# Patient Record
Sex: Female | Born: 1945
Health system: Southern US, Community
[De-identification: ages and names within clinical notes are randomized; demographics above are authoritative.]

## PROBLEM LIST (undated history)

## (undated) DIAGNOSIS — G4730 Sleep apnea, unspecified: Secondary | ICD-10-CM

## (undated) DIAGNOSIS — I10 Essential (primary) hypertension: Secondary | ICD-10-CM

## (undated) DIAGNOSIS — R011 Cardiac murmur, unspecified: Secondary | ICD-10-CM

## (undated) DIAGNOSIS — H9319 Tinnitus, unspecified ear: Secondary | ICD-10-CM

## (undated) DIAGNOSIS — M1990 Unspecified osteoarthritis, unspecified site: Secondary | ICD-10-CM

## (undated) DIAGNOSIS — A6000 Herpesviral infection of urogenital system, unspecified: Secondary | ICD-10-CM

## (undated) DIAGNOSIS — E785 Hyperlipidemia, unspecified: Secondary | ICD-10-CM

## (undated) DIAGNOSIS — N393 Stress incontinence (female) (male): Secondary | ICD-10-CM

## (undated) DIAGNOSIS — N3281 Overactive bladder: Secondary | ICD-10-CM

## (undated) DIAGNOSIS — I341 Nonrheumatic mitral (valve) prolapse: Secondary | ICD-10-CM

## (undated) HISTORY — PX: KNEE ARTHROSCOPY: SUR90

## (undated) HISTORY — DX: Hyperlipidemia, unspecified: E78.5

## (undated) HISTORY — PX: HAND SURGERY: SHX662

## (undated) HISTORY — DX: Essential (primary) hypertension: I10

## (undated) HISTORY — DX: Unspecified osteoarthritis, unspecified site: M19.90

## (undated) HISTORY — PX: TONSILLECTOMY: SUR1361

## (undated) HISTORY — PX: SPINE SURGERY: SHX786

## (undated) HISTORY — PX: TUBAL LIGATION: SHX77

## (undated) HISTORY — DX: Herpesviral infection of urogenital system, unspecified: A60.00

## (undated) HISTORY — PX: OOPHORECTOMY: SHX86

## (undated) HISTORY — DX: Nonrheumatic mitral (valve) prolapse: I34.1

## (undated) HISTORY — PX: JOINT REPLACEMENT: SHX530

## (undated) HISTORY — PX: BREAST SURGERY: SHX581

## (undated) HISTORY — PX: BACK SURGERY: SHX140

## (undated) HISTORY — DX: Cardiac murmur, unspecified: R01.1

---

## 1996-01-27 HISTORY — PX: BREAST EXCISIONAL BIOPSY: SUR124

## 1998-01-03 ENCOUNTER — Ambulatory Visit (HOSPITAL_COMMUNITY): Admission: RE | Admit: 1998-01-03 | Discharge: 1998-01-03 | Payer: Self-pay | Admitting: Obstetrics and Gynecology

## 1998-12-26 ENCOUNTER — Other Ambulatory Visit: Admission: RE | Admit: 1998-12-26 | Discharge: 1998-12-26 | Payer: Self-pay | Admitting: Gynecology

## 1999-01-05 ENCOUNTER — Ambulatory Visit (HOSPITAL_COMMUNITY): Admission: RE | Admit: 1999-01-05 | Discharge: 1999-01-05 | Payer: Self-pay | Admitting: Gynecology

## 1999-01-05 ENCOUNTER — Encounter: Payer: Self-pay | Admitting: Gynecology

## 1999-02-21 ENCOUNTER — Encounter (INDEPENDENT_AMBULATORY_CARE_PROVIDER_SITE_OTHER): Payer: Self-pay | Admitting: Specialist

## 1999-02-21 ENCOUNTER — Ambulatory Visit (HOSPITAL_BASED_OUTPATIENT_CLINIC_OR_DEPARTMENT_OTHER): Admission: RE | Admit: 1999-02-21 | Discharge: 1999-02-21 | Payer: Self-pay | Admitting: Orthopedic Surgery

## 1999-03-20 ENCOUNTER — Other Ambulatory Visit: Admission: RE | Admit: 1999-03-20 | Discharge: 1999-03-20 | Payer: Self-pay | Admitting: Gynecology

## 1999-03-20 ENCOUNTER — Encounter (INDEPENDENT_AMBULATORY_CARE_PROVIDER_SITE_OTHER): Payer: Self-pay

## 2000-01-18 ENCOUNTER — Other Ambulatory Visit: Admission: RE | Admit: 2000-01-18 | Discharge: 2000-01-18 | Payer: Self-pay | Admitting: Gynecology

## 2000-01-23 ENCOUNTER — Encounter: Payer: Self-pay | Admitting: Gynecology

## 2000-01-23 ENCOUNTER — Ambulatory Visit (HOSPITAL_COMMUNITY): Admission: RE | Admit: 2000-01-23 | Discharge: 2000-01-23 | Payer: Self-pay | Admitting: Gynecology

## 2000-06-12 ENCOUNTER — Encounter (INDEPENDENT_AMBULATORY_CARE_PROVIDER_SITE_OTHER): Payer: Self-pay

## 2000-06-12 ENCOUNTER — Ambulatory Visit (HOSPITAL_COMMUNITY): Admission: RE | Admit: 2000-06-12 | Discharge: 2000-06-12 | Payer: Self-pay | Admitting: Gynecology

## 2001-01-20 ENCOUNTER — Other Ambulatory Visit: Admission: RE | Admit: 2001-01-20 | Discharge: 2001-01-20 | Payer: Self-pay | Admitting: Gynecology

## 2001-01-27 ENCOUNTER — Encounter: Payer: Self-pay | Admitting: Gynecology

## 2001-01-27 ENCOUNTER — Ambulatory Visit (HOSPITAL_COMMUNITY): Admission: RE | Admit: 2001-01-27 | Discharge: 2001-01-27 | Payer: Self-pay | Admitting: Gynecology

## 2001-03-06 ENCOUNTER — Encounter: Admission: RE | Admit: 2001-03-06 | Discharge: 2001-03-06 | Payer: Self-pay | Admitting: Internal Medicine

## 2001-03-06 ENCOUNTER — Encounter: Payer: Self-pay | Admitting: Internal Medicine

## 2001-10-19 ENCOUNTER — Encounter: Payer: Self-pay | Admitting: Internal Medicine

## 2001-10-19 ENCOUNTER — Encounter: Admission: RE | Admit: 2001-10-19 | Discharge: 2001-10-19 | Payer: Self-pay | Admitting: Internal Medicine

## 2001-11-09 ENCOUNTER — Emergency Department (HOSPITAL_COMMUNITY): Admission: EM | Admit: 2001-11-09 | Discharge: 2001-11-09 | Payer: Self-pay | Admitting: Emergency Medicine

## 2001-11-27 ENCOUNTER — Encounter: Admission: RE | Admit: 2001-11-27 | Discharge: 2001-11-27 | Payer: Self-pay | Admitting: Internal Medicine

## 2001-11-27 ENCOUNTER — Encounter: Payer: Self-pay | Admitting: Internal Medicine

## 2001-12-01 ENCOUNTER — Ambulatory Visit (HOSPITAL_COMMUNITY): Admission: RE | Admit: 2001-12-01 | Discharge: 2001-12-01 | Payer: Self-pay | Admitting: Internal Medicine

## 2001-12-01 ENCOUNTER — Encounter: Payer: Self-pay | Admitting: Internal Medicine

## 2002-03-02 ENCOUNTER — Encounter: Payer: Self-pay | Admitting: Gynecology

## 2002-03-02 ENCOUNTER — Ambulatory Visit (HOSPITAL_COMMUNITY): Admission: RE | Admit: 2002-03-02 | Discharge: 2002-03-02 | Payer: Self-pay | Admitting: Gynecology

## 2002-03-25 ENCOUNTER — Other Ambulatory Visit: Admission: RE | Admit: 2002-03-25 | Discharge: 2002-03-25 | Payer: Self-pay | Admitting: Gynecology

## 2002-03-30 ENCOUNTER — Encounter: Payer: Self-pay | Admitting: Internal Medicine

## 2002-03-30 ENCOUNTER — Encounter: Admission: RE | Admit: 2002-03-30 | Discharge: 2002-03-30 | Payer: Self-pay | Admitting: Internal Medicine

## 2002-04-30 ENCOUNTER — Encounter: Admission: RE | Admit: 2002-04-30 | Discharge: 2002-04-30 | Payer: Self-pay | Admitting: Internal Medicine

## 2002-04-30 ENCOUNTER — Encounter: Payer: Self-pay | Admitting: Internal Medicine

## 2002-05-12 ENCOUNTER — Encounter: Admission: RE | Admit: 2002-05-12 | Discharge: 2002-05-12 | Payer: Self-pay | Admitting: Gynecology

## 2002-05-12 ENCOUNTER — Encounter: Payer: Self-pay | Admitting: Gynecology

## 2002-09-13 ENCOUNTER — Encounter: Payer: Self-pay | Admitting: Neurosurgery

## 2002-09-13 ENCOUNTER — Encounter: Admission: RE | Admit: 2002-09-13 | Discharge: 2002-09-13 | Payer: Self-pay | Admitting: Neurosurgery

## 2002-09-29 ENCOUNTER — Encounter: Admission: RE | Admit: 2002-09-29 | Discharge: 2002-09-29 | Payer: Self-pay | Admitting: Neurosurgery

## 2002-09-29 ENCOUNTER — Encounter: Payer: Self-pay | Admitting: Neurosurgery

## 2002-11-02 ENCOUNTER — Encounter: Payer: Self-pay | Admitting: Neurosurgery

## 2002-11-04 ENCOUNTER — Ambulatory Visit (HOSPITAL_COMMUNITY): Admission: RE | Admit: 2002-11-04 | Discharge: 2002-11-05 | Payer: Self-pay | Admitting: Neurosurgery

## 2002-11-04 ENCOUNTER — Encounter: Payer: Self-pay | Admitting: Neurosurgery

## 2003-03-29 ENCOUNTER — Ambulatory Visit (HOSPITAL_COMMUNITY): Admission: RE | Admit: 2003-03-29 | Discharge: 2003-03-29 | Payer: Self-pay | Admitting: Gynecology

## 2003-04-05 ENCOUNTER — Other Ambulatory Visit: Admission: RE | Admit: 2003-04-05 | Discharge: 2003-04-05 | Payer: Self-pay | Admitting: Gynecology

## 2003-06-05 ENCOUNTER — Observation Stay (HOSPITAL_COMMUNITY): Admission: EM | Admit: 2003-06-05 | Discharge: 2003-06-06 | Payer: Self-pay | Admitting: Emergency Medicine

## 2003-08-07 ENCOUNTER — Encounter: Admission: RE | Admit: 2003-08-07 | Discharge: 2003-08-07 | Payer: Self-pay | Admitting: Internal Medicine

## 2003-09-06 ENCOUNTER — Encounter: Admission: RE | Admit: 2003-09-06 | Discharge: 2003-09-06 | Payer: Self-pay | Admitting: Neurosurgery

## 2003-09-22 ENCOUNTER — Encounter: Admission: RE | Admit: 2003-09-22 | Discharge: 2003-09-22 | Payer: Self-pay | Admitting: Neurosurgery

## 2003-10-05 ENCOUNTER — Encounter: Admission: RE | Admit: 2003-10-05 | Discharge: 2003-10-05 | Payer: Self-pay | Admitting: Neurosurgery

## 2003-10-22 ENCOUNTER — Ambulatory Visit (HOSPITAL_COMMUNITY): Admission: RE | Admit: 2003-10-22 | Discharge: 2003-10-22 | Payer: Self-pay | Admitting: Neurology

## 2004-01-19 ENCOUNTER — Ambulatory Visit (HOSPITAL_COMMUNITY): Admission: RE | Admit: 2004-01-19 | Discharge: 2004-01-19 | Payer: Self-pay | Admitting: Gastroenterology

## 2004-01-19 ENCOUNTER — Encounter (INDEPENDENT_AMBULATORY_CARE_PROVIDER_SITE_OTHER): Payer: Self-pay | Admitting: *Deleted

## 2004-02-15 ENCOUNTER — Ambulatory Visit (HOSPITAL_COMMUNITY): Admission: RE | Admit: 2004-02-15 | Discharge: 2004-02-15 | Payer: Self-pay | Admitting: Specialist

## 2004-02-15 ENCOUNTER — Ambulatory Visit (HOSPITAL_BASED_OUTPATIENT_CLINIC_OR_DEPARTMENT_OTHER): Admission: RE | Admit: 2004-02-15 | Discharge: 2004-02-15 | Payer: Self-pay | Admitting: Specialist

## 2004-03-29 ENCOUNTER — Ambulatory Visit (HOSPITAL_COMMUNITY): Admission: RE | Admit: 2004-03-29 | Discharge: 2004-03-29 | Payer: Self-pay | Admitting: Gynecology

## 2004-04-02 ENCOUNTER — Encounter: Admission: RE | Admit: 2004-04-02 | Discharge: 2004-04-02 | Payer: Self-pay | Admitting: Internal Medicine

## 2004-04-06 ENCOUNTER — Other Ambulatory Visit: Admission: RE | Admit: 2004-04-06 | Discharge: 2004-04-06 | Payer: Self-pay | Admitting: Gynecology

## 2004-05-21 ENCOUNTER — Ambulatory Visit (HOSPITAL_COMMUNITY): Admission: RE | Admit: 2004-05-21 | Discharge: 2004-05-21 | Payer: Self-pay | Admitting: Neurology

## 2005-04-01 ENCOUNTER — Ambulatory Visit (HOSPITAL_COMMUNITY): Admission: RE | Admit: 2005-04-01 | Discharge: 2005-04-01 | Payer: Self-pay | Admitting: Gynecology

## 2005-05-07 ENCOUNTER — Other Ambulatory Visit: Admission: RE | Admit: 2005-05-07 | Discharge: 2005-05-07 | Payer: Self-pay | Admitting: Gynecology

## 2005-12-17 ENCOUNTER — Ambulatory Visit (HOSPITAL_BASED_OUTPATIENT_CLINIC_OR_DEPARTMENT_OTHER): Admission: RE | Admit: 2005-12-17 | Discharge: 2005-12-17 | Payer: Self-pay | Admitting: Orthopedic Surgery

## 2006-04-30 ENCOUNTER — Ambulatory Visit (HOSPITAL_COMMUNITY): Admission: RE | Admit: 2006-04-30 | Discharge: 2006-04-30 | Payer: Self-pay | Admitting: Gynecology

## 2006-05-13 ENCOUNTER — Other Ambulatory Visit: Admission: RE | Admit: 2006-05-13 | Discharge: 2006-05-13 | Payer: Self-pay | Admitting: Gynecology

## 2006-12-01 ENCOUNTER — Other Ambulatory Visit: Admission: RE | Admit: 2006-12-01 | Discharge: 2006-12-01 | Payer: Self-pay | Admitting: Gynecology

## 2006-12-20 ENCOUNTER — Emergency Department (HOSPITAL_COMMUNITY): Admission: EM | Admit: 2006-12-20 | Discharge: 2006-12-20 | Payer: Self-pay | Admitting: Emergency Medicine

## 2007-01-23 ENCOUNTER — Encounter: Admission: RE | Admit: 2007-01-23 | Discharge: 2007-01-23 | Payer: Self-pay | Admitting: Internal Medicine

## 2007-04-28 ENCOUNTER — Encounter: Admission: RE | Admit: 2007-04-28 | Discharge: 2007-04-28 | Payer: Self-pay | Admitting: Internal Medicine

## 2007-05-06 ENCOUNTER — Ambulatory Visit (HOSPITAL_COMMUNITY): Admission: RE | Admit: 2007-05-06 | Discharge: 2007-05-06 | Payer: Self-pay | Admitting: Gynecology

## 2007-05-13 ENCOUNTER — Other Ambulatory Visit: Admission: RE | Admit: 2007-05-13 | Discharge: 2007-05-13 | Payer: Self-pay | Admitting: Gynecology

## 2007-10-01 ENCOUNTER — Ambulatory Visit: Payer: Self-pay | Admitting: Pulmonary Disease

## 2007-10-01 DIAGNOSIS — G4733 Obstructive sleep apnea (adult) (pediatric): Secondary | ICD-10-CM | POA: Insufficient documentation

## 2007-10-01 DIAGNOSIS — E785 Hyperlipidemia, unspecified: Secondary | ICD-10-CM | POA: Insufficient documentation

## 2007-10-28 ENCOUNTER — Ambulatory Visit (HOSPITAL_BASED_OUTPATIENT_CLINIC_OR_DEPARTMENT_OTHER): Admission: RE | Admit: 2007-10-28 | Discharge: 2007-10-28 | Payer: Self-pay | Admitting: Pulmonary Disease

## 2007-10-28 ENCOUNTER — Encounter: Payer: Self-pay | Admitting: Pulmonary Disease

## 2007-11-13 ENCOUNTER — Ambulatory Visit: Payer: Self-pay | Admitting: Internal Medicine

## 2007-11-18 ENCOUNTER — Telehealth (INDEPENDENT_AMBULATORY_CARE_PROVIDER_SITE_OTHER): Payer: Self-pay | Admitting: *Deleted

## 2007-11-19 ENCOUNTER — Ambulatory Visit: Payer: Self-pay | Admitting: Pulmonary Disease

## 2007-12-21 ENCOUNTER — Ambulatory Visit: Payer: Self-pay | Admitting: Internal Medicine

## 2008-01-15 DIAGNOSIS — H9319 Tinnitus, unspecified ear: Secondary | ICD-10-CM

## 2008-01-15 HISTORY — DX: Tinnitus, unspecified ear: H93.19

## 2008-03-28 ENCOUNTER — Ambulatory Visit: Payer: Self-pay | Admitting: Internal Medicine

## 2008-04-18 ENCOUNTER — Ambulatory Visit: Payer: Self-pay | Admitting: Internal Medicine

## 2008-05-10 ENCOUNTER — Ambulatory Visit (HOSPITAL_COMMUNITY): Admission: RE | Admit: 2008-05-10 | Discharge: 2008-05-10 | Payer: Self-pay | Admitting: Gynecology

## 2008-05-13 ENCOUNTER — Encounter: Admission: RE | Admit: 2008-05-13 | Discharge: 2008-05-13 | Payer: Self-pay | Admitting: Gynecology

## 2008-05-16 ENCOUNTER — Encounter: Payer: Self-pay | Admitting: Gynecology

## 2008-05-16 ENCOUNTER — Ambulatory Visit: Payer: Self-pay | Admitting: Gynecology

## 2008-05-16 ENCOUNTER — Other Ambulatory Visit: Admission: RE | Admit: 2008-05-16 | Discharge: 2008-05-16 | Payer: Self-pay | Admitting: Gynecology

## 2008-06-14 ENCOUNTER — Ambulatory Visit: Payer: Self-pay | Admitting: Gynecology

## 2008-09-23 ENCOUNTER — Ambulatory Visit: Payer: Self-pay | Admitting: Internal Medicine

## 2008-09-30 ENCOUNTER — Ambulatory Visit (HOSPITAL_BASED_OUTPATIENT_CLINIC_OR_DEPARTMENT_OTHER): Admission: RE | Admit: 2008-09-30 | Discharge: 2008-09-30 | Payer: Self-pay | Admitting: Specialist

## 2008-10-07 ENCOUNTER — Ambulatory Visit: Payer: Self-pay | Admitting: Internal Medicine

## 2008-10-17 ENCOUNTER — Ambulatory Visit: Payer: Self-pay | Admitting: Internal Medicine

## 2009-02-10 ENCOUNTER — Ambulatory Visit: Payer: Self-pay | Admitting: Gynecology

## 2009-03-07 ENCOUNTER — Ambulatory Visit: Payer: Self-pay | Admitting: Gynecology

## 2009-05-01 ENCOUNTER — Ambulatory Visit: Payer: Self-pay | Admitting: Internal Medicine

## 2009-05-24 ENCOUNTER — Ambulatory Visit (HOSPITAL_COMMUNITY): Admission: RE | Admit: 2009-05-24 | Discharge: 2009-05-24 | Payer: Self-pay | Admitting: Gynecology

## 2009-05-30 ENCOUNTER — Ambulatory Visit: Payer: Self-pay | Admitting: Gynecology

## 2009-05-30 ENCOUNTER — Other Ambulatory Visit: Admission: RE | Admit: 2009-05-30 | Discharge: 2009-05-30 | Payer: Self-pay | Admitting: Gynecology

## 2009-09-07 ENCOUNTER — Ambulatory Visit: Payer: Self-pay | Admitting: Gynecology

## 2009-11-06 ENCOUNTER — Ambulatory Visit: Payer: Self-pay | Admitting: Internal Medicine

## 2010-01-09 ENCOUNTER — Ambulatory Visit
Admission: RE | Admit: 2010-01-09 | Discharge: 2010-01-09 | Payer: Self-pay | Source: Home / Self Care | Attending: Internal Medicine | Admitting: Internal Medicine

## 2010-01-09 ENCOUNTER — Ambulatory Visit (HOSPITAL_COMMUNITY)
Admission: RE | Admit: 2010-01-09 | Discharge: 2010-01-09 | Payer: Self-pay | Source: Home / Self Care | Attending: Internal Medicine | Admitting: Internal Medicine

## 2010-01-09 ENCOUNTER — Encounter: Payer: Self-pay | Admitting: Internal Medicine

## 2010-01-14 HISTORY — PX: TOTAL KNEE ARTHROPLASTY: SHX125

## 2010-02-04 ENCOUNTER — Encounter: Payer: Self-pay | Admitting: Gynecology

## 2010-02-05 ENCOUNTER — Ambulatory Visit
Admission: RE | Admit: 2010-02-05 | Discharge: 2010-02-05 | Payer: Self-pay | Source: Home / Self Care | Attending: Internal Medicine | Admitting: Internal Medicine

## 2010-02-19 ENCOUNTER — Ambulatory Visit (HOSPITAL_BASED_OUTPATIENT_CLINIC_OR_DEPARTMENT_OTHER)
Admission: RE | Admit: 2010-02-19 | Discharge: 2010-02-19 | Disposition: A | Discharge status: Home / Self Care | Payer: Medicare Other | Attending: Specialist | Admitting: Specialist

## 2010-02-19 DIAGNOSIS — M6789 Other specified disorders of synovium and tendon, multiple sites: Secondary | ICD-10-CM | POA: Insufficient documentation

## 2010-02-19 DIAGNOSIS — M23329 Other meniscus derangements, posterior horn of medial meniscus, unspecified knee: Secondary | ICD-10-CM | POA: Insufficient documentation

## 2010-02-19 DIAGNOSIS — M2350 Chronic instability of knee, unspecified knee: Secondary | ICD-10-CM | POA: Insufficient documentation

## 2010-02-19 LAB — POCT I-STAT, CHEM 8
BUN: 24 mg/dL — ABNORMAL HIGH (ref 6–23)
Chloride: 105 mEq/L (ref 96–112)
Creatinine, Ser: 1 mg/dL (ref 0.4–1.2)
Hemoglobin: 13.6 g/dL (ref 12.0–15.0)
Potassium: 4 mEq/L (ref 3.5–5.1)
Sodium: 140 mEq/L (ref 135–145)

## 2010-02-22 DIAGNOSIS — J40 Bronchitis, not specified as acute or chronic: Secondary | ICD-10-CM

## 2010-02-27 NOTE — Op Note (Signed)
NAMEJERRYE, [name]            ACCOUNT NO.:  [id number]  MEDICAL RECORD NO.:  [id number]          PATIENT TYPE:  AMB  LOCATION:  [facility]                         FACILITY:  [facility]  PHYSICIAN:  Jene Every, M.D.    DATE OF BIRTH:  1945/06/17  DATE OF PROCEDURE:  02/19/2010 DATE OF DISCHARGE:                              OPERATIVE REPORT   PREOPERATIVE DIAGNOSIS:  Medial meniscus tear and degenerative joint disease, right knee.  POSTOPERATIVE DIAGNOSES: 1. Posterior medial meniscus tear. 2. Extensive grade 4 changes of patellofemoral joint. 3. Grade 3 changes of the femoral condyle and tibial plateau medially. 4. Hypertrophic synovitis. 5. Anterior cruciate ligament tear.  PROCEDURE PERFORMED: 1. Right knee arthroscopy. 2. Partial medial meniscectomy. 3. Debridement of anterior cruciate ligament. 4. Debridement of patellofemoral joint, medial compartment. 5. Synovectomy.  ASSISTANT:  None.  BLOOD LOSS:  Minimal.  BRIEF HISTORY:  A 65 year old with knee pain, giving way, and meniscal tear noted by MRI as well as ACL tear, chronic, due to severe degenerative changes, stress reaction to medial compartment of the tibia and locking symptoms and giving way, indicated for arthroscopic debridement.  Risks and benefits discussed including bleeding, infection, damage to vascular structures, no change in symptoms, worsening symptoms, need for repeat debridement, DVT, PE, anesthetic complications, and need for total knee, etc.  TECHNIQUE:  The patient in supine position after induction of adequate general anesthesia and 2 g Kefzol, the right lower extremity was prepped and draped in the usual sterile fashion.  A lateral parapatellar portal was fashioned with a #11 blade.  Ingress cannula atraumatically placed. Irrigant was utilized to insufflate the joint.  Under direct visualization, medial parapatellar portal was fashioned with a #11 blade after localization with an 18 gauge  needle sparing the medial meniscus. Noted were extensive grade 3 changes to the tibial plateau, small area of grade 4 changes in the anterolateral aspect of the tibia.  Unstable tear of posterior third of the medial meniscus.  Straight basket rongeur was introduced.  I performed partial medial meniscectomy to stable base, further contoured with a 4.2 Cuda shaver.  This was to the junction of the medial and posterior third.  The remnant was stable to probe palpation.  The capsule was still intact, as was the posterior third of the meniscus.  Light chondroplasty performed to the femoral condyle and tibial plateau.  Extensive tearing of the ACL was noted, the osteophytes in the intercondylar notch and debrided the ACL, decompressed some of the anterior impingement.  Hypertrophic synovitis was shaved as well. Lateral compartment was relatively unremarkable.  Mild degenerative changes, meniscus stable to probe palpation.  Suprapatellar pouch revealed extensive patellofemoral grade 4 changes, normal slightly lateral tracking of the patella.  Gutters unremarkable. Light debridement was performed here.  Slight chondroplasty to the superior pole.  Knee was copiously lavaged.  Reexamined the medial compartment.  No further pathology amenable to arthroscopic intervention and thinning of the cartilage over the tibia, near grade 4 changes, was consistent with that over the stress reaction of the tibial plateau. Next, all instrumentation was removed.  Portals were closed 4-0 nylon simple sutures, 0.25% Marcaine with epinephrine  was infiltrated in the joint.  Wound was dressed sterilely.  Awoken without difficulty and transported to the recovery room in satisfactory condition.  The patient tolerated the procedure well.  No complication.     Jene Every, M.D.     [name]  D:  02/19/2010  T:  02/19/2010  Job:  [id number]  Electronically Signed by Jene Every M.D. on 02/27/2010 12:17:13 PM

## 2010-04-02 ENCOUNTER — Other Ambulatory Visit: Payer: Self-pay | Admitting: Gynecology

## 2010-04-02 DIAGNOSIS — Z1231 Encounter for screening mammogram for malignant neoplasm of breast: Secondary | ICD-10-CM

## 2010-05-29 NOTE — Procedures (Signed)
[name], WINDT NO.:  [id number]   MEDICAL RECORD NO.:  [id number]          PATIENT TYPE:  OUT   LOCATION:  SLEEP CENTER                 FACILITY:  [facility]   PHYSICIAN:  Barbaraann Share, MD,FCCPDATE OF BIRTH:  06/24/44   DATE OF STUDY:  10/28/2007                            NOCTURNAL POLYSOMNOGRAM   REFERRING PHYSICIAN:   INDICATIONS FOR THE STUDY:  Hypersomnia with sleep apnea.   EPWORTH SCORE:  9.   SLEEP ARCHITECTURE:  The patient had a total sleep time of 369 minutes  with no slow wave sleep and only 45 minutes of REM the entire night.  Sleep onset latency was mildly prolonged at 36 minutes,  and REM onset  was also prolonged at a 176 minutes.  Sleep efficiency was mildly  decreased at 87%.   RESPIRATORY DATA:  The patient was found to have 6 apneas and 95  hypopneas for an AHI of 16 events per hour.  She was also found to have  30 respiratory effort-related arousals, giving her an RDI of 21 events  per hour.  The events were not positional and there was moderate-to-loud  snoring noted throughout.   OXYGEN DATA:  There was 02 desaturation as low as 85% with the patient's  obstructive events.   CARDIAC DATA:  Occasional PAC's were noted but no clinically significant  arrhythmia.   MOVEMENT/PARASOMNIA:  The patient was found to have a 198 periodic leg  movements with 1.5 per hour resulting in arousal or awakening.   IMPRESSION/RECOMMENDATION:  1. Mild-to-moderate obstructive sleep apnea with an apnea-hypopnea      index of 16 events per hour and a respiratory disturbance index of      21 events per hour. There was O2 desaturation as low as 85%.      Treatment for this degree of sleep apnea can include weight loss      alone if applicable, upper airway surgery, oral appliance, and also      a CPAP.  Clinical correlation is suggested.  2. Occasional premature atrial contractions but no significant      arrhythmias seen.  3. Very large numbers of  leg jerks with only minimal sleep disruption.      Clinical correlation is suggested if the patient does not respond      as expected to treatment of her sleep-disordered breathing.      Barbaraann Share, MD,FCCP  Diplomate, American Board of Sleep  Medicine  Electronically Signed     [facility]/MEDQ  D:  11/17/2007 15:27:19  T:  11/18/2007 03:14:31  Job:  [id number]

## 2010-05-30 ENCOUNTER — Ambulatory Visit (HOSPITAL_COMMUNITY)
Admission: RE | Admit: 2010-05-30 | Discharge: 2010-05-30 | Disposition: A | Discharge status: Home / Self Care | Payer: Medicare Other | Source: Ambulatory Visit | Attending: Gynecology | Admitting: Gynecology

## 2010-05-30 DIAGNOSIS — Z1231 Encounter for screening mammogram for malignant neoplasm of breast: Secondary | ICD-10-CM

## 2010-06-01 NOTE — Op Note (Signed)
[name], [name]            ACCOUNT NO.:  [id number]   MEDICAL RECORD NO.:  [id number]          PATIENT TYPE:  AMB   LOCATION:  ENDO                         FACILITY:  [facility]   PHYSICIAN:  Jene Every, M.D.    DATE OF BIRTH:  [birth year]   DATE OF PROCEDURE:  02/15/2004  DATE OF DISCHARGE:                                 OPERATIVE REPORT   PREOPERATIVE DIAGNOSES:  1.  Degenerative meniscus tears.  2.  Partial strains to the anterior cruciate ligament and posterior cruciate      ligament.  3.  Degenerative joint disease.   POSTOPERATIVE DIAGNOSES:  1.  Degenerative meniscus tears.  2.  Partial strains to the anterior cruciate ligament and posterior cruciate      ligament.  3.  Degenerative joint disease.  4.  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   PROCEDURE PERFORMED:  Right knee arthroscopy, partial medial meniscectomy,  debridement of the anterior cruciate ligament and posterior cruciate  ligament, chondroplasty of the patellofemoral joint, and a posterior tibial  plateau.   ANESTHESIA:  General.   INDICATION:  This is a 65 year old who fell onto her knee with persistent  knee pain refractory to conservative treatment.  MRI indicated strains of  the ACL and PCL, degenerative changes, meniscus tear, patellofemoral  chondromalacia, and effusion.  Operative intervention is indicated for  diagnosis, treatment, and debridement.  Risks and benefits were discussed  including bleeding, infection, damages to surrounding structures, no change  in symptoms, worsening symptoms, need for total knee arthroplasty in the  future, etc.   DESCRIPTION OF PROCEDURE:  With the patient in the supine position after  induction of adequate general anesthesia and 1 g Kefzol, the right lower  extremity was prepped and draped in the usual sterile fashion.  A lateral  parapatellar portal and superior medial parapatellar portal was fashioned  with a #11 blade.  __________ were  placed.  Irrigant was utilized to  insufflate the joint.  Under direct visualization exam of the patellofemoral  joint, there was extensive grade 4 changes of the patellofemoral joint  noted.  Loose cartilaginous debris was noted and there was some serial  bloody fluid within the joint.  Collene Mares was introduced via medial  parapatellar portal after localization with an 18-gauge needle sparing the  medial meniscus.  Collene Mares was introduced and utilized to debride the  patellofemoral region and evacuate and lavage the knee.  Examination of the  ACL revealed a partial tear of the ACL about 20%.  The remainder was  competent.  There was a fibrous mass of tissue that was debrided from that.  PCL at least from the medial side also revealed a small partial tear.  It  was felt to be competent.  The medial lateral meniscus revealed degenerative  tearing into the compartment.  Both were shaved and debrided to a stable  base.  The lateral tibial plateau had some grade 3 changes which were shaved  and debrided as well.  On exam of the gutters, they were unremarkable.  The  knee was copiously lavaged.  The remainder of the meniscus was  stable to  probe palpation medially and laterally.  The ACL and PCL were found to be  competent.  Again, the most significant pathology was grade 4 changes of the  patellofemoral joint.  The knee was copiously lavaged, examined all chondral  surfaces, and there was no residual pathology and minimal arthroscopy  intervention.  The knee was copiously lavaged and all instrumentation was  removed.  Portals were closed with 4-0 nylon simple sutures and 0.25%  Marcaine with epinephrine was infiltrated in the joint.  The wound was  dressed sterilely.  She was awakened without difficulty and transported to  recovery in satisfactory condition.   The patient tolerated the procedure well with no complications.      JB/MEDQ  D:  02/15/2004  T:  02/15/2004  Job:  [id number]

## 2010-06-01 NOTE — Op Note (Signed)
NAMELANETT, [name]            ACCOUNT NO.:  [id number]   MEDICAL RECORD NO.:  [id number]          PATIENT TYPE:  AMB   LOCATION:  [facility]                          FACILITY:  [facility]   PHYSICIAN:  Leonides Grills, M.D.     DATE OF BIRTH:  [birth year]   DATE OF PROCEDURE:  12/17/2005  DATE OF DISCHARGE:                               OPERATIVE REPORT   PREOPERATIVE DIAGNOSIS:  1. Left second and third hammer toes.  2. Left tight gastroc.   POSTOPERATIVE DIAGNOSIS:  1. Left second and third hammer toes.  2. Left tight gastroc.   OPERATION:  1. Left gastroc slide.  2. Left second and third toes metatarsal phalangeal joint dorsal      capsulotomy with collateral release.  3. Left second and third toes EDB to EDL tendon transfer.  4. Left second toe proximal phalanx head resection.   ANESTHESIA:  General.   SURGEON:  Leonides Grills, M.D.   ASSISTANT:  Evlyn Kanner, P.A.-C.   POSTOP COURSE:  The patient will follow up in two weeks.  At that time,  remove the dressing as well as sutures.  She will then go into a hard  sole shoe, heel weight bearing.  At five weeks postop, remove the K-wire  and then she will wean herself into a normal shoe.  Once the sutures are  removed, she is to perform skin mobilization over the wounds and once  the K-wire is removed, she is to perform plantar flexion and stretching  exercises.      Leonides Grills, M.D.  Electronically Signed     PB/MEDQ  D:  12/17/2005  T:  12/17/2005  Job:  [id number]

## 2010-06-01 NOTE — H&P (Signed)
[facility]  Patient:    [name], [name]                     MRN: [id number] Adm. Date:  06/12/00 Attending:  Douglass Rivers, M.D.                         History and Physical  CHIEF COMPLAINT:              Postmenopausal bleeding, possible endometrial polyp, complex adnexal mass.  HISTORY OF PRESENT ILLNESS:   The patient is a 65 year old G2, P2, postmenopausal woman, who has been on hormonal replacements since 1998, and has essentially been stable with the exception of an episode of postmenopausal bleeding that she had in 2001, for which she had an endometrial biopsy that came back benign proliferative.  The patient has been cycling monthly on Cenestin 0.625 with Prometrium 200 mg 10 days of the month which was changed from 100 mg she had been on earlier prior to the biopsy.  She did very well and would usually have very light flow although 7 days of the month.  The patient states that she had some bleeding at the end of April that lasted approximately five days like a usual cycle but she had not taken her Prometrium at that point.  She then presented back to our office for an ultrasound sonohysterogram to evaluate the area.  The ultrasound was remarkable for a thickened endometrial stripe which on sonohysterogram delineated an anterior wall echogenic focus that measured 20 x 10 x 17.  The remainder of the cavity appeared atrophic.  Also of note, the patient was noted to have a complex cystic mass.  The right adnexa that was tubular shaped at 3.6 x 1.9 x 3.4 cm with a thin-walled septation.  She presented back to the office the day before surgery for repeat scan to evaluate her adnexa which showed a slight decrease in size, now 3.5 x 2.7 x 2.6 cm.  There was an irregular border and there was some excrescence noted at the wall.  The remainder of the pelvic ultrasound is negative.  There is no fluid in the cul-de-sac.  The patient has no other  complaints.  No weight gain or loss.  No abdominal bloating.  FAMILY HISTORY:               Negative for breast, ovarian, uterine or colon cancer.  PAST OBSTETRIC/GYNECOLOGIC HISTORY:                      Significant for hormone replacement at menopause.  Her Pap smears have been normal.  She has had two vaginal deliveries, neither with complications.  PAST MEDICAL HISTORY:         Significant for chronic hypertension as well as arthritis and plantar fascitis.  MEDICATIONS:                  1. Atenolol 50 mg per day.                               2. Cenestin 0.625 mg per day.                               3. Prometrium 200 mg day 16-25 of the month.  4. Multivitamin.                               5. Glucosamine.                               6. Aleve which was discontinued prior to                                  surgery.  SOCIAL HISTORY:               Negative for tobacco or caffeine.  She walks on a regular basis.  PHYSICAL EXAMINATION:  GENERAL:                      She is in no acute distress.  HEENT:                        Unremarkable.  Thyroid smooth, nontender and mobile.  HEART:                        Regular rate.  LUNGS:                        Clear to auscultation.  BREASTS:                      Without mass, discharge or retraction in the supine upright position.  ABDOMEN:                      Soft, nontender.  GYNECOLOGIC:                  Normal external genitalia.  The BUS is negative. The vagina is pink and moist.  The cervix is without lesions.  The uterus is anteverted and mobile, the upper limits of normal.  The adnexa were not palpable and the rectovaginal examination confirms good tone and guaiac-negative.  By ultrasound her uterus is anteverted 8.4 x 4.6 x 5.2 cm.  Multiple intramural myomas, the largest of which is 19 mm. A complex adnexal mass. The right is noted above.  The left ovary was 1.9 x 1.0 x 1.9  cm.  ASSESSMENT:                   Postmenopausal bleeding with an echogenic focus in the cavity with a complex adnexal mass.  PLAN:                         The patient will present for hysteroscopy with resectoscope to evaluate the uterine mass.  We will also go ahead and do a laparoscopy with the right oophorectomy as opposed to cystectomy based on the patients age.  We will also do pelvic washings at the time.  We will not do frozen section as we will not have her prepped for definitive surgery should there be a malignancy present and she is agreeable to the plan of care.  ACOG handouts were given earlier and all questions were addressed.  She will present in the afternoon of May 30 for surgery. DD:  06/11/00 TD:  06/11/00 Job: [id number]

## 2010-06-01 NOTE — Op Note (Signed)
[name], [name]            ACCOUNT NO.:  [id number]   MEDICAL RECORD NO.:  [id number]          PATIENT TYPE:  AMB   LOCATION:  ENDO                         FACILITY:  [facility]   PHYSICIAN:  Bernette Redbird, M.D.   DATE OF BIRTH:  11/26/1945   DATE OF PROCEDURE:  01/19/2004  DATE OF DISCHARGE:                                 OPERATIVE REPORT   PROCEDURE:  Colonoscopy with biopsies.   INDICATION:  A 65 year old female with a roughly three-week history of  severe diarrhea.   FINDINGS:  Normal exam to the terminal ileum.   PROCEDURE:  The nature, purpose and risks of the procedure were familiar to  the patient, who provided written consent.  Sedation was fentanyl 75 mcg and  Versed 7 mg IV without arrhythmias or desaturation.  The Olympus adjustable-  tension pediatric video colonoscope was advanced to the terminal ileum,  using some external abdominal compression to control looping.  The terminal  ileum had a normal appearance.  Pullback was then performed.   Even though this procedure was done unprepped, there was essentially no  formed stool in the entire colon, just puddles of green liquid in a few  small pieces of retained vegetable debris.  Thus, the quality of prep was  very good and it was felt that all areas were well-seen.   This was a normal examination.  No polyps, cancer, colitis, vascular  malformations or diverticular disease were observed, and retroflexion the  rectum was unremarkable.   The may have been some subtle decrease in vascularity in the rectosigmoid  area, but nothing really impressive and certainly no frank colitis or other  explanation for the patient's diarrhea was seen.   Random biopsies were obtained from the terminal ileum and from the colon.   The patient tolerated the procedure well, and there no apparent  complications.   IMPRESSION:  Diarrhea, without obvious explanation on current examination  (564.5).   PLAN:  Await pathology  results.  If the biopsies are negative and the  diarrhea continues, consider switching some of the patient's medications  such as Cymbalta or Nexium, which were started recently.       RB/MEDQ  D:  01/19/2004  T:  01/19/2004  Job:  [id number]   cc:   Luanna Cole. Lenord Fellers, M.D.  [address] [place]  [place]  [place] [address]  Fax: [phone]

## 2010-06-01 NOTE — Op Note (Signed)
[place]  Patient:    [name], [name]                   MRN: [id number] Proc. Date: 02/21/99 Adm. Date:  [id number] Attending:  Marlowe Sh[place]                           Operative Report  PREOPERATIVE DIAGNOSIS:  Right thumb distal phalangeal osseous tumor.  POSTOPERATIVE DIAGNOSIS:  Right thumb distal phalangeal osseous tumor.  PROCEDURE:  Excisional biopsy of right thumb distal phalangeal osseous growth.   SURGEON:  Artist Pais. Mina Marble, M.D.  ANESTHESIA:  Bier block.  TOURNIQUET TIME:  30 minutes.  COMPLICATIONS:  None.  DRAINS:  None.  SPECIMEN:  One sent.  OPERATIVE REPORT:  Patient was taken to operating room.  After the induction of  adequate Bier block analgesia, the right upper extremity was prepped and draped in the usual sterile fashion.  At this point in time, the nail plate was carefully  elevated off the nail bed and the eponychial fold removed from the right thumb.  Once this was done, a 15 blade was used to split the nail bed for 6 mm from the tip of the thumb distally over a subcutaneous prominence coming off of the distal phalanx.  The incision was carried down to the lower surface for a total of 2 cm. Once this was done, the nail bed was carefully dissected sharply off the underlying distal phalanx until what appeared to be an osteochondroma was dissected circumferentially off of the soft tissues.  The apparent exostosis measured approximately 6 mm to 7 mm x about 3 mm.  Using a combination of 15 blade and Freer elevator, the soft tissues on the pulp of the thumb were carefully dissected down until the normal contour of the distal phalanx was identified.  Once this was done, a rongeur was used to excise the exostosis from the distal phalanx until a smooth edge was created, again using a combination of the rongeur and Therapist, nutritional.  Once this was done, the wound was thoroughly irrigated.  Some of  the additional  soft tissues surrounding this mass were also sent for pathologic confirmation. The nail bed was then repaired using a 6-0 Vicryl in three simple sutures.  A fair amount of excess tissue that  had been created from the mass effect of this exostosis was then excised using a 15 blade and a primary closure was then performed using a 5-0 nylon x 5 sutures.  A sterile dressing of Xeroform, 4 x 4s, fluffs and a splint was applied.  The patient tolerated the procedure well and ent to recovery room in stable fashion. DD:  02/21/99 TD:  02/21/99 Job: [id number]

## 2010-06-01 NOTE — Discharge Summary (Signed)
NAME:  LAWSYN, HEILER                      ACCOUNT NO.:  [id number]   MEDICAL RECORD NO.:  [id number]                   PATIENT TYPE:  OBV   LOCATION:  2899                                 FACILITY:  [facility]   PHYSICIAN:  Jonna L. Robb Matar, M.D.            DATE OF BIRTH:  12/29/1945   DATE OF ADMISSION:  06/04/2003  DATE OF DISCHARGE:  06/06/2003                                 DISCHARGE SUMMARY   PRIMARY CARE PHYSICIAN:  Luanna Cole. Lenord Fellers, M.D.   FINAL DIAGNOSES:  1. Noncardiac chest pain.  2. Esophageal spasm secondary to irritation from antiinflammatories.  3. Hypertension.  4. Mitral valve prolapse.  5. Degenerative disk disease.  6. Mild hypercholesterolemia.   ALLERGIES:  1. Itching from MORPHINE.  2. And SULFA.   CODE STATUS:  Full.   CONSULTANTS:  [facility], Dr. Allyson Sabal.   HISTORY:  This 65 year old white female has developed anterior upper right  sided chest pain, sharp, burning, intermittent, spasm like.  She would get  anxious when she got these, and by the time her blood pressure was checked,  it was elevated.  In the emergency room, it did not respond to Rolaids but  was relieved by nitroglycerin.   PAST MEDICAL HISTORY:  1. Mitral valve prolapse.  2. Degenerative disk disease that was initially operated upon but apparently     some fragments have broken loose and are causing pain, and the patient is     discussing whether she should have her surgery redone.   PHYSICAL EXAMINATION:  On admission was unremarkable, except for the back  pain.   Cardiac enzymes were negative.   EKG was unremarkable.   HOSPITAL COURSE:  The patient had several recurrences of these requiring at  least two nitroglycerin to clear.  She had been on Aleve for back pains, and  her blood pressures would usually go up with it.  Her atenolol was  increased.  She was added onto an ACE inhibitor.  Using nitroglycerin as  needed and consultation was sought.  They  felt that she had enough family  history and risk factors to warrant cardiac catheterization which was done,  on Jun 06, 2003, and was found to have normal LV function and normal  coronary arteries.   Other laboratory values were an LDL of 149, and a total cholesterol of 218.  BNP was normal.   DISPOSITION:  The patient will be discharged on:  1. Omeprazole 20 mg every day to take care of any elements of GERD,     esophageal spasm, or esophagitis.  2. For the actual chest pain, she can either try some Norvasc 5 mg b.i.d.     p.r.n. or nitroglycerin spray one every up to five minutes x 3 if needed.  3. She will go back on her regular medications of:     a. Neurontin 300 mg five at bedtime.     b. Atenolol  50 every day.     c. She will add on Enalapril 2.5 mg every day for the blood pressure.     d. She will go back on her Ambien and,     e. Prempro.  4. I have asked her not to use any further antiinflammatories but to try     Tylenol, Ultram both in combination or the hydrocodone that she has at     home.   She is to call Dr. Lenord Fellers in the morning to get an appointment in 1-2 weeks.  At that time, her cholesterol issues can be addressed.                                                Jonna L. Robb Matar, M.D.    Dorna Bloom  D:  06/06/2003  T:  06/07/2003  Job:  [id number]   cc:   Luanna Cole. Lenord Fellers, M.D.  [address], [place]  [place]  [place] [address]  Fax: [phone]

## 2010-06-01 NOTE — Op Note (Signed)
NAMECALENE, PARADISO            ACCOUNT NO.:  [id number]   MEDICAL RECORD NO.:  [id number]          PATIENT TYPE:  AMB   LOCATION:  DSC                          FACILITY:  [facility]   PHYSICIAN:  Leonides Grills, M.D.     DATE OF BIRTH:  07-22-1945   DATE OF PROCEDURE:  12/17/2005  DATE OF DISCHARGE:                               OPERATIVE REPORT   PREOPERATIVE DIAGNOSES:  1. Left second and third hammertoes.  2. Left tight gastroc.   POSTOPERATIVE DIAGNOSES:  1. Left second and third hammertoes.  2. Left tight gastroc.   OPERATION:  1. Left gastroc slide  2. Left second and third toes Metatarsophalangeal joint dorsal      capsulotomy with collateral release.  3. Left second and third toes extensor digitorum brevis to extensor      digitorum longus tendon transfer.  4. Left second toe proximal phalanx head resection.   ANESTHESIA:  General with popliteal block.   SURGEON:  Leonides Grills, MD   ASSISTANT:  Evlyn Kanner, P.A.   ESTIMATED BLOOD LOSS:  Minimal.   TOURNIQUET TIME:  Approximately 40 minutes.   COMPLICATIONS:  None.   DISPOSITION:  Stable to PR.   INDICATIONS:  This is a 65 year old female who has had longstanding left  forefoot pain interfering with her life to the point she can not do what  she wants to do despite shoe wear modification, several different types  of pads.  She was consented for the above procedure.  All risks which  include infection, nerve or vessel injury, persistent pain, worsened  pain, prolonged recovery, cock-up toe deformity, recurrence of deformity  were all explained, questions encouraged and answered.   OPERATION:  The patient was brought to the operating room placed in  supine position.  After adequate general endotracheal tube anesthesia  was administered as well as Ancef 1 g IV piggyback, left lower extremity  prepped and draped sterile manner over proximally thigh tourniquet. The  limb was gravity exsanguinated and tourniquet  elevated to 290 mmHg.  Longitudinal incision over the medial aspect of left gastrocnemius  muscle tendon junction was then made.  Dissection was carried down  through the skin.  Hemostasis was obtained.  Fascia was opened in line  with the incision.  Conjoined region was then developed between the  gastroc and soleus.  Soft tissue was elevated off the posterior aspect  of the gastrocnemius.  Sural nerve was identified protected posteriorly.  I then made a longitudinal incision on the dorsal aspect of the left  second and third toes after the tourniquet was inflated with gravity  exsanguination.  Once the incisions were made, dissection was carried  down to the dorsal aspect of the of the extensor tendons, extensor  digitorum longus was tenotomized proximal medial and the brevis distal  lateral, retracted out of harm's way for later transfer for both second  and third toes through their  separate incisions. We then performed MTP  joint dorsal capsulotomy with collateral release for both joints.  There  was synovitis in both joints.  A synovectomy was performed as well.  For  the second toe, the distal aspect of the proximal phalanx was  skeletonized and the head was then removed with the rongeur followed by  a bone cutter.  This cut was made perpendicular to long axis of the  proximal phalanx.  We then copiously irrigated the area with normal  saline.  We then placed a 0.045 K-wire antegrade through middle and  distal phalanx, reduced PIP joint, fired retrograde with the toe held in  reduced position.  This held the toe in excellent position and  maintenance of the correction.  We then completed the transfer the EDB  to EDL tendons and reconstructed this with a 3-0 PDS suture.  This had  outstanding repair.  Area was copiously irrigated with normal saline.  Tourniquet was deflated.  Hemostasis was obtained. The gastrocnemius was  closed with 3-0 Vicryl followed by 4-0 Monocryl subcuticular  stitch.  Steri-Strips were applied.  The toe wounds were closed with 4-0 nylon  suture.  K-wire was bent, cut and capped.  Skin relieving incisions were  made on either side of the K-wire.  All toes pinked up nicely.  A  sterile dressing was applied.  Cam walker boot was applied.  The patient  was stable to PR.      Leonides Grills, M.D.  Electronically Signed     PB/MEDQ  D:  12/17/2005  T:  12/17/2005  Job:  [id number]

## 2010-06-01 NOTE — Cardiovascular Report (Signed)
NAME:  [name], [name]                      ACCOUNT NO.:  [id number]   MEDICAL RECORD NO.:  [id number]                   PATIENT TYPE:  OBV   LOCATION:  2899                                 FACILITY:  [facility]   PHYSICIAN:  Nanetta Batty, M.D.                DATE OF BIRTH:  Mar 13, 1945   DATE OF PROCEDURE:  06/06/2003  DATE OF DISCHARGE:  06/06/2003                              CARDIAC CATHETERIZATION   INDICATIONS:  [name] is a 65 year old married white female who was  admitted May 22 with atypical chest pain.  She ruled out for myocardial  infarction.  She had a normal EKG.  Because of recurrent chest pain which  was nitrate responsive, she was placed on IV heparin and nitro and presents  now for diagnostic coronary arteriography.   PROCEDURE DESCRIPTION:  The patient was brought to the second floor [facility] in the postabsorptive state.  She was  premedicated with p.o. Valium.  Her right groin was prepped and shaved in  the usual sterile fashion.  1% Xylocaine was used for local anesthesia.  A 6  French sheath was inserted into the right femoral artery using standard  Seldinger technique. A 6 French right and left Judkins diagnostic catheter  as well as 6 French pigtail catheter were used for selective coronary  angiography, left ventriculography respectively.  Omnipaque dye was used for  the entirety of the case. Retrograde aortic, ventricular pullback pressures  were recorded.   HEMODYNAMICS:  1. Aortic systolic pressure 108, diastolic pressure 61.  2. Left ventricular systolic pressure 121, end-diastolic pressure 32.  3. There was no obvious pullback gradient noted.   SELECTIVE CORONARY ANGIOGRAPHY:  1. Left main normal.  2. LAD:  Normal.  3. Left circumflex:  Normal.  4. Right coronary artery was dominant and normal.   LEFT VENTRICULOGRAPHY:  RAO left ventriculogram was performed using 25 mL of  Omnipaque dye at 12 mL per second.  The  overall LVEF was estimated at  greater than 60% without focal wall motion abnormalities.   IMPRESSION:  [name] has normal coronary arteries, normal LV function.  I believe her chest pain was noncardiac and most likely multifactorial  including anxiety and reflux.  An ACT was measured and the sheaths were  removed.  Pressure was held to the groin to achieve hemostasis.  The patient  left the lab in stable condition.  She will be discharged home later today  and will be seen back by Dr. Lenord Fellers in followup.  She left the lab in stable  condition.                                               Nanetta Batty, M.D.    [name]  D:  06/06/2003  T:  06/07/2003  Job:  [id number]   cc:   [facility]  [address]  [place], [place] [address]   Luanna Cole. Lenord Fellers, M.D.  [address], [place]  [place]  [place] [address]  Fax: [phone]

## 2010-06-01 NOTE — Op Note (Signed)
NAME:  [name], [name]                      ACCOUNT NO.:  [id number]   MEDICAL RECORD NO.:  [id number]                   PATIENT TYPE:  OIB   LOCATION:  3014                                 FACILITY:  [facility]   PHYSICIAN:  Hewitt Shorts, M.D.            DATE OF BIRTH:  [birth year]   DATE OF PROCEDURE:  11/04/2002  DATE OF DISCHARGE:                                 OPERATIVE REPORT   PREOPERATIVE DIAGNOSIS:  Left L4-5 lumbar disk herniation, degenerative disk  disease, spondylosis and radiculopathy.   POSTOPERATIVE DIAGNOSIS:  Left L4-5 lumbar disk herniation, degenerative  disk disease, spondylosis and radiculopathy.   PROCEDURE:  Left L4-5 lumbar laminotomy and microdiskectomy with  microdissection.   SURGEON:  Hewitt Shorts, M.D.   ASSISTANT:  Stefani Dama, M.D.   ANESTHESIA:  General endotracheal.   INDICATIONS:  The patient is a 65 year old woman who presented with a left  lumbar radiculopathy and was found by MRI scan to have left L4-5 disk  herniation superimposed upon degenerative disk disease and spondylosis.  Decision was made to proceed with decompression.   DESCRIPTION OF PROCEDURE:  The patient was brought to the operating room and  placed under general endotracheal anesthesia.  The patient was turned to a  prone position.  The lumbar region was prepped with DuraPrep and draped in a  sterile fashion.  The midline incision was infiltrated with local anesthetic  with epinephrine and x-ray was taken.  The L4-5 level was identified and a  midline incision made over the L4-5 level.  Dissection was carried down to  the subcutaneous tissue and the lumbar fascia was incised on the left side  of the midline to the paraspinal muscle to the lamina in a subperiosteal  fascia.  Another x-ray was taken.  The L4-5 intralaminar space was  identified.  The microscope was draped and brought into the field to provide  instrument magnification, illumination and  visualization.  The remainder of  the decompression was performed using microdissection and microsurgical  technique.  A laminotomy was performed using the Midas Rex drill with along  with Kerrison punches exposing this carefully.  We resected and identified  the thecal sac and left L5 nerve root.  These were gently retracted  medially.  There were a moderate amount of extradural veins that were  coagulated and incised and the disk herniation was identified.  We proceeded  with incision of the annulus and continued with microcurets and pituitary  rongeurs.  We then removed any degenerated herniated disk material.  Particular attention was paid to remove the spondylitic overgrowth from the  posterior aspect of both L4 and L5 and also particular attention was paid to  removed herniated disk located laterally within the left L4-5 foramen.  In  the end, all of the loose fragments of disk material were removed from the  disk space, the epidural space and the foramen, and good decompression of  the thecal sac and nerve roots was achieved.  Bipolar cautery was used to  establish hemostasis.  Once the diskectomy was completed and decompression  completed, we proceeded with closure.  Prior to closure, 2 mL of fentanyl  and 80 mg of Depo-Medrol was infused into the epidural space and then the  wound was closed in multiple layers.  The fascia was closed with interrupted  undyed 1 Vicryl suture.  The subcutaneous and subcuticular area were closed  with interrupted inverted 2-0 undyed Vicryl suture.  The skin was  reapproximated with Dermabond.  The patient tolerated the  procedure well.  The estimated blood loss was 50 mL.  Sponge count was  correct.  Following surgery, the patient was turned back to the supine  position to be reversed from anesthetic, extubated and transferred to the  recovery room for further care.                                               Hewitt Shorts, M.D.     RWN/MEDQ  D:  11/04/2002  T:  11/04/2002  Job:  [id number]

## 2010-06-01 NOTE — H&P (Signed)
NAME:  [name], [name]                      ACCOUNT NO.:  [id number]   MEDICAL RECORD NO.:  [id number]                   PATIENT TYPE:  OBV   LOCATION:  2013                                 FACILITY:  [facility]   PHYSICIAN:  Renato Battles, M.D.                  DATE OF BIRTH:  02-09-1945   DATE OF ADMISSION:  06/05/2003  DATE OF DISCHARGE:                                HISTORY & PHYSICAL   PRIMARY CARE DOCTOR:  Dr. Luanna Cole. [place].   REASON FOR ADMISSION:  Chest pain.   HISTORY OF PRESENT ILLNESS:  The patient is a 65 year old white female who  presented with left anterior chest pain with no radiation, no shortness of  breath, no nausea, no vomiting, no sweating.  The pain came while she was  working; it was on and off and did not respond to Rolaids, relieved only by  nitroglycerin after 2 hours.   REVIEW OF SYSTEMS:  CONSTITUTIONAL SYSTEM:  No fever, chills or night  sweats.  No weight changes.  GI:  No nausea or vomiting.  No diarrhea or  constipation.  CARDIOPULMONARY:  No cough, no shortness of breath, no  orthopnea, no PND.  GU:  No dysuria or hematuria or retention.   PAST MEDICAL HISTORY:  1. Mitral valve prolapse.  2. Degenerative disk disease.   PAST SURGICAL HISTORY:  1. Laminectomy.  2. Oophorectomy.  3. Tubal ligation.   FAMILY HISTORY:  Family history of hypertension, scleroderma and renal  failure in the mother; no information available about the father.   SOCIAL HISTORY:  No tobacco, alcohol or drugs.  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   ALLERGIES:  SULFA.   CURRENT MEDICATIONS:  1. Atenolol 50 mg p.o. nightly.  2. Neurontin 1500 mg p.o. nightly.  3. Daily multivitamins.  4. Calcium 600 mg p.o. b.i.d.  5. Aleve 2 tablets b.i.d.  6. Glucosamine.  7. Ambien 10 mg p.o. nightly p.r.n.  8. Prempro patch 0.0375 mg q.2 wk.   PHYSICAL EXAM:  GENERAL:  Alert and oriented x3, in no acute distress.  VITAL SIGNS:  Vital signs are stable.  HEENT:  Head is atraumatic and normocephalic.  Pupils are equal, round and  reactive to light and accommodation.  Extraocular movements intact  bilaterally.  NECK:  No lymphadenopathy, no thyromegaly, no JVD.  CHEST:  Chest clear to auscultation bilaterally.  No wheezing, rales or  rhonchi.  HEART:  Regular rate and rhythm.  ABDOMEN:  Abdomen soft, nontender and nondistended.  Normoactive bowel  sounds.  EXTREMITIES:  No cyanosis, edema or clubbing.   STUDIES:  Electrolytes were within normal.  Cardiac enzymes:  The first set  was negative.   EKG shows normal sinus rhythm with no changes.   ASSESSMENT AND PLAN:  This is a 65 year old pretty healthy white female  presenting with left anterior  chest pain.  Her risk factors include being  postmenopausal.  Likelihood for coronary artery disease is pretty low.  I am  gong to place her in observation telemetry, monitoring blood pressure, check  fasting lipids.  We will continue the atenolol, start her on aspirin and  oxygen and she probably will need a stress test to further discern her risk  for recurrent coronary artery disease, but that can be done as an outpatient  after the current episode.                                                Renato Battles, M.D.    SA/MEDQ  D:  06/05/2003  T:  06/06/2003  Job:  [id number]   cc:   Luanna Cole. Lenord Fellers, M.D.  [address], [place]  [place]  [place] [address]  Fax: [phone]

## 2010-06-01 NOTE — Op Note (Signed)
[facility]  Patient:    [name], [name]                   MRN: [id number] Proc. Date: 06/12/00 Adm. Date:  [id number] Attending:  Douglass Rivers                           Operative Report  PREOPERATIVE DIAGNOSIS:        1. Postmenopausal bleeding.                                2. Endometrial polyp.                                3. Complex adnexal mass.  POSTOPERATIVE DIAGNOSIS:       1. Postmenopausal bleeding.                                2. Endometrial polyp.                                3. Complex adnexal mass.                                4. Cystadenoma by frozen section.                                5. Endometriosis.                                6. Pelvic adhesions.                                7. Uterine fibroids.  OPERATION:                     Laparoscopic right salpingo-oophorectomy,                                hysteroscopic resection of uterine fibroid.  SURGEON:                       Douglass Rivers, M.D.  ASSISTANT:  ANESTHESIA:                    General.  ESTIMATED BLOOD LOSS:          Minimal.  I&O DEFICIT:                   By hysteroscopy was 100 cc, given                                Sorbitol solution.  FINDINGS:                      Endometriotic scarring in the cul-de-sac, anterior abdominal wall, right uterosacral ligament and right ovary.  The right ovary was grossly  enlarged.  The left was normal.  Evidence of previous tubal ligation was noted.  There were bowel adhesions to the left tube at the region of the ligation.  By hysteroscopy there was noted to be an anterior wall fibroid versus endometrial polyp.  PATHOLOGY:                     Peritoneal washings, right tube and ovary, endometrial curettings and endometrial resections.  COMPLICATIONS:                 None.  DESCRIPTION OF PROCEDURE:      The patient was taken to the operating room and placed in the dorsal lithotomy position,  prepped and draped in the usual sterile fashion after general anesthesia.  Prior to prepping, laminaria was removed and the bivalve speculum was placed in the vagina, the cervix was visualized and the uterine manipulator was placed.  The bladder was drained, a Foley catheter was not placed.  An infraumbilical incision was made with a scalpel through which the Veress needle was inserted.  Opening pressure was 2. A pneumoperitoneum was then appreciated.  The Veress needle was then removed and a #10-11 disposable trocar was then inserted through the port and placement into the abdominal cavity was confirmed and further pneumoperitoneum was then  created.  The patient was placed in lithotomy position.  Attention was then turned to the abdomen.  Infraumbilical incision was made with the scalpel through which the Veress needle was inserted.  Opening pressure was 2.  A #10-11 disposable trocar was then inserted through the infraumbilical port, placement into the abdominal cavity was confirmed.  The patient was placed in Trendelenburg position.  It was noted to be a complex adnexal mass on the right. The left adnexa was not visualized initially because of bowel adhesions.  A #5 superpubic port was then placed under direct visualization.  Peritoneal washings were obtained.  At this point the ovary was able to be elevated and better visualized.  We were also able to maneuver around the bowel to be able to see the left ovary which was grossly normal and atrophic.  The tubo-ovarian ligament was grasped with an atraumatic grasper, cauterized including the proximal portion of the tube and then sharply transected.  The ampullary portion of the tube was then grasped with the atraumatic grasper in a similar fashion.  We coagulated the mesosalpinx and sharply dissected the tube and ovary off.  The #5 port had been placed in the right lower quadrant under direct visualization.  The superpubic port was  then converted to a 10 port.  Using the two ports, we were able to place the tube and ovary complex in a bag and bring it out through the skin.  Because there were some questionable changes to the ovary, we elected to not decompress the ovary in situ.  The incision was then extended and the tube and ovary in the bag was then sent to pathology for frozen section.  Inspection of the pelvis assured [place] of hemostasis at the operative site.  There was no abnormality of the upper abdomen that was noted.  The fascia was then closed on the superpubic site. The trocar was left in the infraumbilical site.  We then turned our attention to the hysteroscopic portion of the case.  The uterine manipulator was removed.  The sterile weighted speculum was placed in the vagina.  A Sims was placed and the cervix was visualized.  The cervix  was noted to be markedly dilated.  The diagnostic scope was placed in the cervix. Unfortunately the dilation exceeded the size of the scope and we were unable to get adequate visualization.  The dilators were used without resistance and the operative scope was then placed through the cervix after 33 French dilator was advanced through the cervix. There was noted to be a fullness in the anterior wall that was consistent with the area of concern by sonohysterogram.  The anterior wall defect was sharply dissected off with the resectoscope. Of note, the endometrial lining was fluffy consistent with the progesterone effect.  The single loop resectoscope was then changed out for a ball and the base of the lesion was then cauterized and was noted to be hemostatic.  The instruments were then removed from the vagina.  The cervix was noted to be hemostatic.  Gloves were then changed and we went back to the abdomen.  Her umbilical fascia was closed with a figure-of-eight of 0 Vicryl.  The skin was closed with 4-0 plain.  The superpubic and lateral ports were closed with staples.  All  three ports were injected with 0.25% Marcaine solution for a total of 14 cc.  The patient tolerated the procedure well.  Sponge, lap and  needle counts were correct x 2 and she was extubated in the operating room and transferred to the PACU in stable condition. DD:  06/12/00 TD:  06/12/00 Job: [id number]

## 2010-06-05 ENCOUNTER — Other Ambulatory Visit: Payer: Self-pay | Admitting: *Deleted

## 2010-06-05 DIAGNOSIS — E785 Hyperlipidemia, unspecified: Secondary | ICD-10-CM

## 2010-06-05 MED ORDER — SIMVASTATIN 20 MG PO TABS: 20.0000 mg | ORAL_TABLET | Freq: Every evening | ORAL | Status: DC

## 2010-06-07 ENCOUNTER — Encounter: Payer: Medicare Other | Admitting: Gynecology

## 2010-06-07 ENCOUNTER — Other Ambulatory Visit (HOSPITAL_COMMUNITY)
Admission: RE | Admit: 2010-06-07 | Discharge: 2010-06-07 | Disposition: A | Discharge status: Home / Self Care | Payer: Medicare Other | Source: Ambulatory Visit | Attending: Gynecology | Admitting: Gynecology

## 2010-06-07 ENCOUNTER — Encounter (INDEPENDENT_AMBULATORY_CARE_PROVIDER_SITE_OTHER): Payer: Medicare Other | Admitting: Gynecology

## 2010-06-07 ENCOUNTER — Other Ambulatory Visit: Payer: Self-pay | Admitting: Gynecology

## 2010-06-07 DIAGNOSIS — Z124 Encounter for screening for malignant neoplasm of cervix: Secondary | ICD-10-CM | POA: Insufficient documentation

## 2010-06-07 DIAGNOSIS — N952 Postmenopausal atrophic vaginitis: Secondary | ICD-10-CM

## 2010-06-07 DIAGNOSIS — N951 Menopausal and female climacteric states: Secondary | ICD-10-CM

## 2010-06-07 DIAGNOSIS — R82998 Other abnormal findings in urine: Secondary | ICD-10-CM

## 2010-07-12 ENCOUNTER — Other Ambulatory Visit: Payer: Medicare Other | Admitting: Internal Medicine

## 2010-07-12 ENCOUNTER — Other Ambulatory Visit: Payer: Self-pay | Admitting: Internal Medicine

## 2010-07-12 DIAGNOSIS — E785 Hyperlipidemia, unspecified: Secondary | ICD-10-CM

## 2010-07-12 DIAGNOSIS — Z0000 Encounter for general adult medical examination without abnormal findings: Secondary | ICD-10-CM

## 2010-07-12 DIAGNOSIS — E789 Disorder of lipoprotein metabolism, unspecified: Secondary | ICD-10-CM

## 2010-07-12 LAB — CBC WITH DIFFERENTIAL/PLATELET
Basophils Relative: 1 % (ref 0–1)
Eosinophils Absolute: 0.2 10*3/uL (ref 0.0–0.7)
MCH: 30.8 pg (ref 26.0–34.0)
MCHC: 33.6 g/dL (ref 30.0–36.0)
Neutrophils Relative %: 59 % (ref 43–77)
Platelets: 210 10*3/uL (ref 150–400)
RBC: 4.64 MIL/uL (ref 3.87–5.11)

## 2010-07-12 LAB — BASIC METABOLIC PANEL
Calcium: 9.1 mg/dL (ref 8.4–10.5)
Sodium: 140 mEq/L (ref 135–145)

## 2010-07-12 LAB — LIPID PANEL
Cholesterol: 198 mg/dL (ref 0–200)
VLDL: 17 mg/dL (ref 0–40)

## 2010-07-12 LAB — HEPATIC FUNCTION PANEL
ALT: 27 U/L (ref 0–35)
AST: 23 U/L (ref 0–37)
Bilirubin, Direct: 0.1 mg/dL (ref 0.0–0.3)
Indirect Bilirubin: 0.5 mg/dL (ref 0.0–0.9)
Total Bilirubin: 0.6 mg/dL (ref 0.3–1.2)

## 2010-07-12 LAB — TSH: TSH: 1.7 u[IU]/mL (ref 0.350–4.500)

## 2010-07-13 ENCOUNTER — Encounter: Payer: Self-pay | Admitting: Internal Medicine

## 2010-07-13 ENCOUNTER — Ambulatory Visit (INDEPENDENT_AMBULATORY_CARE_PROVIDER_SITE_OTHER): Payer: Medicare Other | Admitting: Internal Medicine

## 2010-07-13 DIAGNOSIS — I059 Rheumatic mitral valve disease, unspecified: Secondary | ICD-10-CM

## 2010-07-13 DIAGNOSIS — M1710 Unilateral primary osteoarthritis, unspecified knee: Secondary | ICD-10-CM

## 2010-07-13 DIAGNOSIS — R7303 Prediabetes: Secondary | ICD-10-CM

## 2010-07-13 DIAGNOSIS — R7309 Other abnormal glucose: Secondary | ICD-10-CM

## 2010-07-13 DIAGNOSIS — I10 Essential (primary) hypertension: Secondary | ICD-10-CM

## 2010-07-13 DIAGNOSIS — M170 Bilateral primary osteoarthritis of knee: Secondary | ICD-10-CM

## 2010-07-13 DIAGNOSIS — E669 Obesity, unspecified: Secondary | ICD-10-CM

## 2010-07-13 DIAGNOSIS — I341 Nonrheumatic mitral (valve) prolapse: Secondary | ICD-10-CM

## 2010-07-13 LAB — VITAMIN D 25 HYDROXY (VIT D DEFICIENCY, FRACTURES): Vit D, 25-Hydroxy: 46 ng/mL (ref 30–89)

## 2010-07-13 LAB — HEMOGLOBIN A1C: Mean Plasma Glucose: 117 mg/dL — ABNORMAL HIGH (ref <117)

## 2010-07-14 ENCOUNTER — Encounter: Payer: Self-pay | Admitting: Internal Medicine

## 2010-07-14 DIAGNOSIS — I341 Nonrheumatic mitral (valve) prolapse: Secondary | ICD-10-CM | POA: Insufficient documentation

## 2010-07-14 DIAGNOSIS — E669 Obesity, unspecified: Secondary | ICD-10-CM | POA: Insufficient documentation

## 2010-07-14 NOTE — Progress Notes (Signed)
  Subjective:    Patient ID: [name], female    DOB: 11-03-1945, 65 y.o.   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 She is to have knee replacement late July. Unable to exercise because of knee pain. Fasting serum glucose is elevated. Hemoglobin A1c 5.7% consistent with borderline diabetes. Patient needs to watch her diet. Offered prescription for Accu-Chek machine and she declined. Says she will be able to exercise 3 months after her knee replacement and wants to wait for reevaluation in 6 months. History of mitral valve prolapse controlled for many years on Tenormin. Never had hypertension. History of GE reflux and sleep apnea. Dr. Audie Box is GYN physician. History of hyperlipidemia on Zocor.    Review of Systems  Constitutional: Negative for fever, diaphoresis, activity change and fatigue.  HENT: Negative.   Eyes: Negative.   Respiratory: Negative.   Cardiovascular: Negative.   Gastrointestinal: Negative.   Genitourinary: Positive for urgency. Negative for frequency.  Musculoskeletal: Positive for joint swelling.       Right knee pain and swelling  Neurological: Negative.   Hematological: Negative.   Psychiatric/Behavioral: Negative.        Objective:   Physical Exam  Constitutional: She is oriented to person, place, and time. She appears well-developed and well-nourished. No distress.  HENT:  Head: Normocephalic and atraumatic.  Left Ear: External ear normal.  Nose: Nose normal.  Mouth/Throat: Oropharynx is clear and moist. No oropharyngeal exudate.  Eyes: Pupils are equal, round, and reactive to light. Right eye exhibits no discharge. Left eye exhibits no discharge. No scleral icterus.  Neck: Normal range of motion. Neck supple. No JVD present. No thyromegaly present.  Cardiovascular: Normal rate and regular rhythm.  Exam reveals friction rub. Exam reveals no gallop.   No murmur heard.      No click  Pulmonary/Chest: Effort  normal and breath sounds normal. She has no wheezes. She has no rales.  Abdominal: Soft. Bowel sounds are normal. She exhibits no distension and no mass. There is no tenderness. There is no rebound and no guarding.  Musculoskeletal: She exhibits tenderness. She exhibits no edema.       Right knee  Lymphadenopathy:    She has no cervical adenopathy.  Neurological: She is alert and oriented to person, place, and time. She has normal reflexes. No cranial nerve deficit.  Skin: No rash noted.  Psychiatric: She has a normal mood and affect. Her behavior is normal. Judgment and thought content normal.          Assessment & Plan:  1-right knee osteoarthritis for knee replacement  2-mitral valve prolapse-stable on Tenormin  3-hyperlipidemia  4-obesity  5-sleep apnea  6-GE reflux  7-Borderline Diabetes  Plan okay for knee replacement surgery by Dr. Shelle Iron at [facility] July. Return here in 6 months at which time she'll need lipid panel liver functions and hemoglobin A1c.

## 2010-07-14 NOTE — Patient Instructions (Signed)
Please follow a low-fat low carbohydrate diet. Once knee surgery has been completed & your rehabilitation has been completed please try to exercise and lose weight

## 2010-08-01 ENCOUNTER — Other Ambulatory Visit: Payer: Self-pay | Admitting: Specialist

## 2010-08-01 ENCOUNTER — Ambulatory Visit (HOSPITAL_COMMUNITY)
Admission: RE | Admit: 2010-08-01 | Discharge: 2010-08-01 | Disposition: A | Discharge status: Home / Self Care | Payer: Medicare Other | Source: Ambulatory Visit | Attending: Specialist | Admitting: Specialist

## 2010-08-01 ENCOUNTER — Encounter (HOSPITAL_COMMUNITY): Payer: Medicare Other

## 2010-08-01 ENCOUNTER — Other Ambulatory Visit (HOSPITAL_COMMUNITY): Payer: Self-pay | Admitting: Specialist

## 2010-08-01 DIAGNOSIS — Z01818 Encounter for other preprocedural examination: Secondary | ICD-10-CM

## 2010-08-01 DIAGNOSIS — M1710 Unilateral primary osteoarthritis, unspecified knee: Secondary | ICD-10-CM | POA: Insufficient documentation

## 2010-08-01 DIAGNOSIS — M2340 Loose body in knee, unspecified knee: Secondary | ICD-10-CM | POA: Insufficient documentation

## 2010-08-01 DIAGNOSIS — Z01812 Encounter for preprocedural laboratory examination: Secondary | ICD-10-CM | POA: Insufficient documentation

## 2010-08-01 LAB — CBC
HCT: 44.7 % (ref 36.0–46.0)
Hemoglobin: 14.8 g/dL (ref 12.0–15.0)
MCV: 90.3 fL (ref 78.0–100.0)
RBC: 4.95 MIL/uL (ref 3.87–5.11)
RDW: 13 % (ref 11.5–15.5)
WBC: 5.2 10*3/uL (ref 4.0–10.5)

## 2010-08-01 LAB — URINALYSIS, ROUTINE W REFLEX MICROSCOPIC
Hgb urine dipstick: NEGATIVE
Specific Gravity, Urine: 1.029 (ref 1.005–1.030)
Urobilinogen, UA: 0.2 mg/dL (ref 0.0–1.0)
pH: 6 (ref 5.0–8.0)

## 2010-08-01 LAB — COMPREHENSIVE METABOLIC PANEL
ALT: 29 U/L (ref 0–35)
Alkaline Phosphatase: 72 U/L (ref 39–117)
BUN: 28 mg/dL — ABNORMAL HIGH (ref 6–23)
CO2: 28 mEq/L (ref 19–32)
Calcium: 10.5 mg/dL (ref 8.4–10.5)
GFR calc Af Amer: >60 mL/min (ref >60)
GFR calc non Af Amer: >60 mL/min (ref >60)
Glucose, Bld: 88 mg/dL (ref 70–99)
Potassium: 4.1 mEq/L (ref 3.5–5.1)
Sodium: 140 mEq/L (ref 135–145)

## 2010-08-01 LAB — DIFFERENTIAL
Basophils Absolute: 0 10*3/uL (ref 0.0–0.1)
Eosinophils Relative: 3 % (ref 0–5)
Lymphocytes Relative: 23 % (ref 12–46)
Lymphs Abs: 1.2 10*3/uL (ref 0.7–4.0)
Neutro Abs: 3.4 10*3/uL (ref 1.7–7.7)
Neutrophils Relative %: 65 % (ref 43–77)

## 2010-08-01 LAB — URINE MICROSCOPIC-ADD ON

## 2010-08-01 LAB — ABO/RH: ABO/RH(D): O POS

## 2010-08-09 ENCOUNTER — Inpatient Hospital Stay (HOSPITAL_COMMUNITY): Payer: Medicare Other

## 2010-08-09 ENCOUNTER — Inpatient Hospital Stay (HOSPITAL_COMMUNITY)
Admission: RE | Admit: 2010-08-09 | Discharge: 2010-08-13 | DRG: 470 | Disposition: A | Discharge status: Skilled Nursing Facility | Payer: Medicare Other | Source: Ambulatory Visit | Attending: Specialist | Admitting: Specialist

## 2010-08-09 DIAGNOSIS — Z01812 Encounter for preprocedural laboratory examination: Secondary | ICD-10-CM

## 2010-08-09 DIAGNOSIS — G5790 Unspecified mononeuropathy of unspecified lower limb: Secondary | ICD-10-CM | POA: Diagnosis present

## 2010-08-09 DIAGNOSIS — R5082 Postprocedural fever: Secondary | ICD-10-CM | POA: Diagnosis not present

## 2010-08-09 DIAGNOSIS — Z8614 Personal history of Methicillin resistant Staphylococcus aureus infection: Secondary | ICD-10-CM

## 2010-08-09 DIAGNOSIS — M1710 Unilateral primary osteoarthritis, unspecified knee: Principal | ICD-10-CM | POA: Diagnosis present

## 2010-08-09 DIAGNOSIS — E785 Hyperlipidemia, unspecified: Secondary | ICD-10-CM | POA: Diagnosis present

## 2010-08-09 DIAGNOSIS — G4733 Obstructive sleep apnea (adult) (pediatric): Secondary | ICD-10-CM | POA: Diagnosis present

## 2010-08-09 LAB — URINALYSIS, ROUTINE W REFLEX MICROSCOPIC
Bilirubin Urine: NEGATIVE
Glucose, UA: NEGATIVE mg/dL
Ketones, ur: NEGATIVE mg/dL
pH: 5.5 (ref 5.0–8.0)

## 2010-08-10 LAB — BASIC METABOLIC PANEL
BUN: 14 mg/dL (ref 6–23)
Calcium: 8.5 mg/dL (ref 8.4–10.5)
GFR calc non Af Amer: >60 mL/min (ref >60)
Glucose, Bld: 162 mg/dL — ABNORMAL HIGH (ref 70–99)

## 2010-08-10 LAB — CBC
HCT: 36.5 % (ref 36.0–46.0)
Hemoglobin: 12.5 g/dL (ref 12.0–15.0)
MCH: 30.6 pg (ref 26.0–34.0)
MCHC: 34.2 g/dL (ref 30.0–36.0)
MCV: 89.2 fL (ref 78.0–100.0)

## 2010-08-10 LAB — URINE CULTURE: Culture  Setup Time: 201207261415

## 2010-08-11 LAB — BASIC METABOLIC PANEL
BUN: 11 mg/dL (ref 6–23)
Chloride: 96 mEq/L (ref 96–112)
GFR calc Af Amer: >60 mL/min (ref >60)
Potassium: 4.5 mEq/L (ref 3.5–5.1)

## 2010-08-11 LAB — CBC
HCT: 34.1 % — ABNORMAL LOW (ref 36.0–46.0)
RDW: 13.2 % (ref 11.5–15.5)
WBC: 7.6 10*3/uL (ref 4.0–10.5)

## 2010-08-12 LAB — BASIC METABOLIC PANEL
BUN: 9 mg/dL (ref 6–23)
CO2: 28 mEq/L (ref 19–32)
Chloride: 97 mEq/L (ref 96–112)
Glucose, Bld: 121 mg/dL — ABNORMAL HIGH (ref 70–99)
Potassium: 3.7 mEq/L (ref 3.5–5.1)

## 2010-08-12 LAB — CBC
HCT: 34.1 % — ABNORMAL LOW (ref 36.0–46.0)
Hemoglobin: 11.9 g/dL — ABNORMAL LOW (ref 12.0–15.0)
MCHC: 34.9 g/dL (ref 30.0–36.0)
WBC: 5.2 10*3/uL (ref 4.0–10.5)

## 2010-08-12 NOTE — Op Note (Signed)
[name], [name] NO.:  [id number]  MEDICAL RECORD NO.:  [id number]  LOCATION:  0005                         FACILITY:  [facility]  PHYSICIAN:  Jene Every, M.D.    DATE OF BIRTH:  [birth year]  DATE OF PROCEDURE:  08/09/2010 DATE OF DISCHARGE:                              OPERATIVE REPORT   PREOPERATIVE DIAGNOSIS:  Degenerative joint disease, right knee.  POSTOPERATIVE DIAGNOSIS:  Degenerative joint disease, right knee.  PROCEDURE PERFORMED:  Right total knee arthroplasty.  ANESTHESIA:  General.  ASSISTANT:  Roma Schanz, P.A.  BRIEF HISTORY:  A 65 year old with osteoarthritis in the knee, prior conservative treatment including arthroscopic debridement, injections, disabling symptoms, slight flexion in contracture, who is indicated for replacement of degenerative joint.  Risks and benefits discussed including bleeding, infection, damage to neurovascular structures, change in symptoms, worsening symptoms, need for repeat debridement, scar tissue component revision, DVT, PE, anesthetic complications, etc.  TECHNIQUE:  With the patient placed in supine position and after adequate general anesthesia, 600 clindamycin and Kefzol given due to her history of MRSA, right lower extremity was prepped, draped and exsanguinated in the usual sterile fashion.  Tourniquet inflated to 350 mmHg.  Midline incision was made with the knee flexed.  Full-thickness flap was developed.  Anterior incision was made.  Medial parapatellar arthrotomy was performed, patella was everted, knee flexed.  Copious portions of clear synovial fluid evacuated.  Tricompartmental osteoporosis was noted.  Osteophytes removed with rongeur.  We elevated soft tissues medially, preserving the lateral ligaments, removed intra- articular loose bodies.  Step drill utilized to enter the femoral canal and was irrigated 5 degrees right, 11 off the distal femur to a slight flexion contracture to  perform a distal femoral cut.  Soft tissues protected and incised the distal femur to 3 degrees of external rotation, pinned, and the femoral cutting jig then applied.  Anterior and posterior cuts and chamfer cuts were then performed.  Attention turned towards the fibula which was subluxed utilizing detailed remnants of the menisci were removed.  Geniculate vessels cauterized.  External alignment guide utilized and off the high side which was laterally, bisecting the angle parallel to the long shaft of the tibia, pinned. Oscillating saw utilized to perform tibial cut.  We then used our spacer blocks, flexion and extension there were equivalent.  Attention turned towards completing the tibia, tibia was subluxed, McHale was used to maximize the tibial surface with 3 tray disk of the medial aspect of the tibial tubercle which was pinned.  We used a center punch drill and punch guide.  This was then kept.  Attention turned towards completing the distal femur.  Box cut was used bisecting the notch.  We performed her box cut.  Prior to that, we had following her cut we had excellent cut with more taken off the lateral aspect of the femur than the medial with appropriate piano sign.  There was no notching of the femur.  We measured off the anterior cortex of the femur for 3 size.  Next, we trialed the femur, tibia 10-mm insert.  Full extension, full flexion, good stability.  Varus and valgus stressing 0 to 30 degrees.  Attention turned then towards  the patella, measured at 19 and sized at 35, selected 14th thickness, used a planer saw.  Then, 35 drilled peg holes utilizing the patella placed on her trial patella with good flexion and extension and good patellofemoral tracking.  All trials were then removed.  We checked posteriorly, slightly released the capsule.  We cauterized the geniculate.  Pulsatile lavage utilized to irrigate under low low-pressure pulsatile lavage.  Knee was then flexed.   All surfaces were dried appropriately.  Cemented in back table, in appropriate fashion, injected with digital pressurization of the tibial canal, impacted the tibial component.  Redundant cement was removed, impacted the femoral component cemented on posterior runners in the distal femur. Then, cement was removed.  Placed a trial of 10, reduced it, held it in extension with axial load applied throughout carrying the cement.  We cemented the patellar component as well.  After curing of the cement, with full extension and full flexion, good stability and varus-valgus stressing 0 to 30 degrees.  Negative anterior drawer.  Positive for patellar tendon.  Removed the trial, inspected the joint, redundant cement removed, meticulously bone wax in the three cancellous surfaces. Copiously irrigated the wound and placed a permanent platform insert, reduced it again with full extension with flexion.  Good stability, varus and valgus stressing at 0 to 30 degrees.  Good patellofemoral tracking.  Negative anterior drawer.  Next, Hemovac was placed, brought out through a lateral stab wound of skin.  The patellar arthrotomy of the knee at 30 degrees of flexion and without tilt, #1 Vicryl interrupted figure-of-8 sutures, subcu with #2 Vicryl, skin was reapproximated with staples.  The wound was dressed sterilely.  Had flexion of 90 degrees against gravity.  Tourniquet was deflated.  There was adequate revascularization of the lower extremities appreciated.  The patient tolerated the procedure well.  No complications.  Tourniquet time was 90 minutes.     Jene Every, M.D.     [name]  D:  08/09/2010  T:  08/09/2010  Job:  [id number]  Electronically Signed by Jene Every M.D. on 08/12/2010 09:50:22 AM

## 2010-08-12 NOTE — H&P (Signed)
[name], BAYS NO.:  [id number]  MEDICAL RECORD NO.:  [id number]  LOCATION:                                 FACILITY:  PHYSICIAN:  Jene Every, M.D.    DATE OF BIRTH:  05/31/1945  DATE OF ADMISSION:  08/09/2010 DATE OF DISCHARGE:                             HISTORY & PHYSICAL   CHIEF COMPLAINT:  Right knee pain.  HISTORY:  [name] is a well-known patient to our practice.  She has had many years of right knee pain.  She has undergone conservative treatment including injections with knee arthroscopy with persistent disabling symptoms.  Radiographs do reveal tricompartmental osteoarthritis.  Despite activity modification, cortisone injection, arthroscopic debridement, she has had disabling pain, it was felt at this time that she would benefit from a total knee arthroplasty.  The risks and benefits of this were discussed with the patient.  She does wish to proceed.  MEDICAL HISTORY:  Significant for; 1. Mitral valve prolapse. 2. Hyperlipidemia. 3. Chronic nerve pain left lower extremity secondary to lumbar spine     surgery. 4. Sleep apnea. 5. Recent resolved MRSA infection.  CURRENT MEDICATIONS:  Include; 1. Cymbalta 90 mg daily. 2. Simvastatin 20 mg daily. 3. Atenolol 50 mg daily.  Over-the-counter medications include, calcium, fish oil, multivitamins, Primazole, coenzyme Q10, glucosamine, stool softener, selenium, turmeric,  and ibuprofen.  ALLERGIES: 1. ADHESIVE TAPE. 2. MORPHINE causes severe itching.  PAST SURGICAL HISTORY:  Previous surgeries include right oophorectomy, right foot surgery, right knee arthroscopy, left knee arthroscopy x2, tubal ligation, microdiskectomy at L5-S1 with S1 nerve root damage.  SOCIAL HISTORY:  The patient is widowed.  She is a Academic librarian.  History of negative for tobacco.  She does drink alcohol on occasion.  She lives alone.  She plans to go to [facility] following  surgery.  PRIMARY CARE PHYSICIAN:  [name]. Baxley, MD  FAMILY HISTORY:  Mother had significant history of alcoholism with development of cirrhosis, kidney failure, and scleroderma.  Father's history is unknown.  She has no siblings.  REVIEW OF SYSTEMS:  GENERAL:  The patient denies any fever, chills, night sweats or bleeding tendencies.  CNS:  No blurred or double vision, seizure, headache, or paralysis.  She does note tinnitus.  RESPIRATORY: No shortness of breath, productive cough, or hemoptysis. CARDIOVASCULAR:  No chest pain, angina, or orthopnea.  GU:  No dysuria, hematuria, or discharge.  GI:  No nausea, vomiting, diarrhea, constipation, or melena.  SKIN:  The patient recently had a boil on her back that was diagnosed as MRSA via swab.  She was treated with clindamycin.  This has completely resolved.  MUSCULOSKELETAL:  As per HPI.  PHYSICAL EXAMINATION:  VITAL SIGNS:  On physical exam, pulse of 76, respiratory rate 18, BP 129/86. GENERAL:  Slightly overweight female.  She does walk with slight antalgic gait.  Height 5 feet 6 inches.  Weight is 222. HEENT:  Atraumatic, normocephalic.  Pupils equal, round, and reactive to light.  EOMs intact. NECK:  Supple.  No lymphadenopathy. CHEST:  Clear to auscultation bilaterally. HEART:  Regular rate and rhythm without murmurs, gallops, or rubs. ABDOMEN:  Soft, nontender, nondistended.  Bowel sounds x4.  SKIN:  She does have a boil on her  back that is completely healed up. There is no evidence of any skin breakdown.  No extensive areas of MRSA are noted. EXTREMITIES:  In regard to the knee, she does have mild effusion.  She is tender to palpation along the medial compartment.  Range of motion is minus 5 to 120.  Calves soft, nontender.  No instability with varus and valgus stressing.  IMPRESSION:  Tricompartmental osteoarthritis, right knee.  PLAN:  The patient will be admitted to undergo a right total knee arthroplasty.  She is  currently using the Hibiclens wash as well as Bactroban nasal ointment preoperative for decolonization.     Roma Schanz, P.A.   ______________________________ Jene Every, M.D.    CS/MEDQ  D:  08/08/2010  T:  08/08/2010  Job:  [id number]  Electronically Signed by Roma Schanz P.A. on 08/09/2010 03:00:20 PM Electronically Signed by Jene Every M.D. on 08/12/2010 09:50:20 AM

## 2010-08-13 LAB — CBC
HCT: 33.7 % — ABNORMAL LOW (ref 36.0–46.0)
Hemoglobin: 11.5 g/dL — ABNORMAL LOW (ref 12.0–15.0)
MCH: 30.1 pg (ref 26.0–34.0)
MCHC: 34.1 g/dL (ref 30.0–36.0)
MCV: 88.2 fL (ref 78.0–100.0)

## 2010-08-28 NOTE — Discharge Summary (Signed)
[name], [name] NO.:  [id number]  MEDICAL RECORD NO.:  [id number]  LOCATION:  1602                         FACILITY:  [facility]  PHYSICIAN:  Jene Every, M.D.    DATE OF BIRTH:  03/08/1945  DATE OF ADMISSION:  08/09/2010 DATE OF DISCHARGE:  08/13/2010                              DISCHARGE SUMMARY   ADMITTING DIAGNOSES: 1. Degenerative joint disease, right knee. 2. Mitral valve prolapse. 3. Hyperlipidemia. 4. Chronic nerve damage, left lower extremity. 5. Apnea. 6. Methicillin-resistant Staphylococcus aureus infection.  DISCHARGE DIAGNOSES: 1. Degenerative joint disease, right knee. 2. Mitral valve prolapse. 3. Hyperlipidemia. 4. Chronic nerve damage, left lower extremity. 5. Apnea. 6. Methicillin-resistant Staphylococcus aureus infection. 7. Status post right total knee arthroplasty.  HISTORY:  Ms. [name] is a well known patient to our practice.  She has had a several-year history of right knee pain.  She had undergone conservative treatment including injections and knee arthroscopy, with persistent disabling symptoms.  At this time, she had been admitted for total knee arthroplasty.  Risks and benefits of the surgery were discussed with the patient.  She elected to proceed.  PROCEDURE:  The patient was taken to the OR on August 09, 2010, and underwent right total knee arthroplasty.  Surgeon was Dr. Jene Every. Assistant was Sherryle Lis, PAC.  Anesthesia general.  Complications none.  CONSULTATION:  PT and OT care management.  LABORATORY DATA:  Preoperative labs are listed in the system on her current visit.  Admission urinalysis was negative.  Culture was negative.  Postoperative H and H showed white cell count 10.3, hemoglobin 12.5.  This was monitored throughout hospital stay.  She remained stable at the time of discharge.  Hemoglobin was 11.5 with hematocrit of 33.7.  Routine chemistries sharply showed sodium 132, potassium 3.9  with normal BUN and creatinine.  This was followed throughout hospital course.  Sodium did drop to the level of 128.  At the time of discharge, improved to 130.  Again, BUN and creatinine were within normal range.  Preoperative chest x-ray showed no acute abnormalities.  Preoperative EKG showed sinus bradycardia, otherwise within normal limits.  HOSPITAL COURSE:  The patient was admitted and taken to the OR and underwent the above-stated procedure without difficulty.  One Hemovac drain was placed intraoperatively.  She was then transferred to PACU. Unfortunately, the drained was pulled by the x-ray tech.  This was reinforced pattern.  She was brought to the orthopedic floor and continued postoperative care.  On postop day #1, the patient was having significant discomfort.  The pain was not controlled on TPN analgesics, not initiated p.o. medications due to difficulty sleeping,  fairly miserable on first postoperative day.  Her vital signs were stable.  Her hemoglobin was stable.  Hemovac was discontinued.  Dressing was taken down secondary to saturation.  New sterile dressing was applied.  Motor and neurovascular function is intact extremity.  Medications were adjusted.  Discharge plan was initiated.  PT worked with the patient over the next several days.  She further progressed.  Her pain was better controlled with p.o. medications.  The patient did have a low- grade temperature of 99.  Intensive spirometry was encouraged.  Vital signs remained stable.  Hemoglobin continued to remain stable.  The patient progressed again with Physical Therapy.  On Day #4, it is felt at this point, the patient was stable to be discharged to [facility]. Incision remained clean, dry, and intact without evidence of infection. She had mild edema into lower extremities.  Motor and vascular function remained intact as well.  The patient continued with low-grade fever. Again, intensive spirometer was  encouraged.  She denied any urinary symptoms at the time of discharge.  DISPOSITION:  The patient was stable for discharge to [facility].  She is to followup with Dr. Shelle Iron in approximately 14 days from the day of surgery for surgery removal and x-ray.  ACTIVITY:  She can weight bear as tolerated on left lower extremity. Utilize knee immobilizer, twisting, straight leg raise x 10 and then she can discontinue this.  She only needs to use this when she is ambulating.  She can have this while in bed.  Would recommend elevating the leg at least 6 times a day for 20 minutes at a time for edema control.  Wound care, daily dressing changes.  She is to keep the area clean, dry, and intact.  Okay to be discharged with CPM.  DISCHARGE MEDICATIONS:  From Med-Rec sheet.  She will need to be on Xarelto for 21 days total for DVT prophylaxis.  DIET:  Heart healthy.  CONDITION ON DISCHARGE:  Stable.  The patient does have CPAP machine which she uses on occasions.  Continue encourage incentive spirometer every hour while awake.  FINAL DIAGNOSES:  Status post right total knee arthroplasty.     Roma Schanz, P.A.   ______________________________ Jene Every, M.D.    CS/MEDQ  D:  08/13/2010  T:  08/13/2010  Job:  [id number]  Electronically Signed by Roma Schanz P.A. on 08/27/2010 03:39:22 PM Electronically Signed by Jene Every M.D. on 08/28/2010 01:24:19 PM

## 2010-09-11 ENCOUNTER — Ambulatory Visit: Payer: Medicare Other | Attending: Specialist

## 2010-09-11 DIAGNOSIS — M25669 Stiffness of unspecified knee, not elsewhere classified: Secondary | ICD-10-CM | POA: Insufficient documentation

## 2010-09-11 DIAGNOSIS — M25569 Pain in unspecified knee: Secondary | ICD-10-CM | POA: Insufficient documentation

## 2010-09-11 DIAGNOSIS — M6281 Muscle weakness (generalized): Secondary | ICD-10-CM | POA: Insufficient documentation

## 2010-09-11 DIAGNOSIS — Z96659 Presence of unspecified artificial knee joint: Secondary | ICD-10-CM | POA: Insufficient documentation

## 2010-09-11 DIAGNOSIS — IMO0001 Reserved for inherently not codable concepts without codable children: Secondary | ICD-10-CM | POA: Insufficient documentation

## 2010-09-12 ENCOUNTER — Ambulatory Visit: Payer: Medicare Other | Admitting: Physical Therapy

## 2010-09-18 ENCOUNTER — Ambulatory Visit: Payer: Medicare Other | Attending: Specialist | Admitting: Physical Therapy

## 2010-09-18 DIAGNOSIS — M25669 Stiffness of unspecified knee, not elsewhere classified: Secondary | ICD-10-CM | POA: Insufficient documentation

## 2010-09-18 DIAGNOSIS — M6281 Muscle weakness (generalized): Secondary | ICD-10-CM | POA: Insufficient documentation

## 2010-09-18 DIAGNOSIS — M25569 Pain in unspecified knee: Secondary | ICD-10-CM | POA: Insufficient documentation

## 2010-09-18 DIAGNOSIS — Z96659 Presence of unspecified artificial knee joint: Secondary | ICD-10-CM | POA: Insufficient documentation

## 2010-09-18 DIAGNOSIS — IMO0001 Reserved for inherently not codable concepts without codable children: Secondary | ICD-10-CM | POA: Insufficient documentation

## 2010-09-20 ENCOUNTER — Ambulatory Visit: Payer: Medicare Other

## 2010-09-24 ENCOUNTER — Ambulatory Visit: Payer: Medicare Other | Admitting: Physical Therapy

## 2010-09-26 ENCOUNTER — Ambulatory Visit: Payer: Medicare Other | Admitting: Physical Therapy

## 2010-10-02 ENCOUNTER — Ambulatory Visit: Payer: Medicare Other | Admitting: Physical Therapy

## 2010-10-04 ENCOUNTER — Ambulatory Visit: Payer: Medicare Other

## 2010-10-08 ENCOUNTER — Ambulatory Visit: Payer: Medicare Other | Admitting: Physical Therapy

## 2010-10-09 ENCOUNTER — Telehealth: Payer: Self-pay | Admitting: Internal Medicine

## 2010-10-09 NOTE — Telephone Encounter (Signed)
Appt w/labs scheduled for patient Oct 16 & 18th.

## 2010-10-09 NOTE — Telephone Encounter (Signed)
Will need to arrange OV , lipid and AIC for October but not next week and cancel December appt.

## 2010-10-11 ENCOUNTER — Ambulatory Visit: Payer: Medicare Other

## 2010-10-17 ENCOUNTER — Ambulatory Visit: Payer: Medicare Other | Attending: Specialist

## 2010-10-17 DIAGNOSIS — IMO0001 Reserved for inherently not codable concepts without codable children: Secondary | ICD-10-CM | POA: Insufficient documentation

## 2010-10-17 DIAGNOSIS — M25569 Pain in unspecified knee: Secondary | ICD-10-CM | POA: Insufficient documentation

## 2010-10-17 DIAGNOSIS — M25669 Stiffness of unspecified knee, not elsewhere classified: Secondary | ICD-10-CM | POA: Insufficient documentation

## 2010-10-17 DIAGNOSIS — M6281 Muscle weakness (generalized): Secondary | ICD-10-CM | POA: Insufficient documentation

## 2010-10-17 DIAGNOSIS — Z96659 Presence of unspecified artificial knee joint: Secondary | ICD-10-CM | POA: Insufficient documentation

## 2010-10-30 ENCOUNTER — Other Ambulatory Visit: Payer: Medicare Other | Admitting: Internal Medicine

## 2010-10-30 ENCOUNTER — Ambulatory Visit: Payer: Medicare Other

## 2010-11-01 ENCOUNTER — Other Ambulatory Visit: Payer: Medicare Other | Admitting: Internal Medicine

## 2010-11-01 ENCOUNTER — Ambulatory Visit: Payer: Medicare Other | Admitting: Internal Medicine

## 2010-11-01 DIAGNOSIS — E785 Hyperlipidemia, unspecified: Secondary | ICD-10-CM

## 2010-11-01 DIAGNOSIS — E119 Type 2 diabetes mellitus without complications: Secondary | ICD-10-CM

## 2010-11-01 LAB — HEMOGLOBIN A1C
Hgb A1c MFr Bld: 5.6 % (ref <5.7)
Mean Plasma Glucose: 114 mg/dL (ref <117)

## 2010-11-01 LAB — HEPATIC FUNCTION PANEL
Bilirubin, Direct: 0.1 mg/dL (ref 0.0–0.3)
Indirect Bilirubin: 0.3 mg/dL (ref 0.0–0.9)

## 2010-11-01 LAB — LIPID PANEL
LDL Cholesterol: 98 mg/dL (ref 0–99)
VLDL: 14 mg/dL (ref 0–40)

## 2010-11-02 ENCOUNTER — Ambulatory Visit (INDEPENDENT_AMBULATORY_CARE_PROVIDER_SITE_OTHER): Payer: Medicare Other | Admitting: Internal Medicine

## 2010-11-02 ENCOUNTER — Encounter: Payer: Self-pay | Admitting: Internal Medicine

## 2010-11-02 DIAGNOSIS — E559 Vitamin D deficiency, unspecified: Secondary | ICD-10-CM

## 2010-11-02 DIAGNOSIS — M1712 Unilateral primary osteoarthritis, left knee: Secondary | ICD-10-CM

## 2010-11-02 DIAGNOSIS — G4730 Sleep apnea, unspecified: Secondary | ICD-10-CM

## 2010-11-02 DIAGNOSIS — I059 Rheumatic mitral valve disease, unspecified: Secondary | ICD-10-CM

## 2010-11-02 DIAGNOSIS — R7309 Other abnormal glucose: Secondary | ICD-10-CM

## 2010-11-02 DIAGNOSIS — I341 Nonrheumatic mitral (valve) prolapse: Secondary | ICD-10-CM

## 2010-11-02 DIAGNOSIS — E785 Hyperlipidemia, unspecified: Secondary | ICD-10-CM

## 2010-11-02 DIAGNOSIS — M1710 Unilateral primary osteoarthritis, unspecified knee: Secondary | ICD-10-CM

## 2010-11-02 DIAGNOSIS — R7302 Impaired glucose tolerance (oral): Secondary | ICD-10-CM

## 2010-11-10 NOTE — Progress Notes (Signed)
  Subjective:    Patient ID: [name], female    DOB: 02-09-1945, 65 y.o.   MRN: [id number]  HPI 65 year old white female with history of glucose intolerance, hyperlipidemia, and hypertension for six-month recheck. Patient has been seen here since 1997. Formerly was seen by Dr. Alison Stalling who retired. History of mitral valve prolapse diagnosed in 1983 with trace mitral regurgitation and trace tricuspid regurgitation. Has been on Tenormin 50 mg daily for control of palpitations for many years. History of acne rosacea. Had large core needle biopsy of right breast March 1997 and pathology was benign. Dr. Audie Box is GYN physician. History of tonsillectomy and adenoidectomy at age 75. History of bilateral tubal ligation at age 9. 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 Issues with right knee dating back to at least 1997 with possible internal drainage but at that time requiring steroid knee injections. History of fractured clavicle before age 36 not sure if it was right or left.  She is a widow. Husband died of her brain tumor. 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 Patient does not smoke or consume alcohol. 2 adult daughters.  Family history. Her parents divorced when she was 11 years old and she doesn't know about her father's family history. Mother died at age 13 with heart failure kidney failure and history of scleroderma. She also had a history of hypertension and was an alcoholic. Paternal grandmother died at age 88 with abdominal aortic aneurysm, hypertension and diabetes.   Patient had colonoscopy in 2009. Had right oophorectomy D&C for uterine polyp June 2002. Surgery for right knee torn meniscus February 2006. Left knee arthroscopic surgery. L4-L5 microdiscectomy October 2004. Diagnosed with sleep apnea 2009. History of GE reflux. Right knee replacement February 2012. Has done well with this. Has been able to lose weight on a new diet.  Previous weight April 2011 219 pounds and in January 2012 was 222 pounds. Blood pressure was 142/86 with weight of 228 pounds December 2011. Blood pressure is now normalized.     Review of Systems     Objective:   Physical Exam chest clear to auscultation; cardiac exam regular rate and rhythm normal S1 and S2 no click appreciated, extremities without lower extremity edema.        Assessment & Plan:  Hypertension-well controlled on Tenormin  Hyperlipidemia-on current diet cholesterol is now completely normal. In 2009 total cholesterol was 202, triglycerides 210 and LDL cholesterol 115. Now total cholesterol is 160, triglycerides 72, LDL cholesterol 98.  Glucose intolerance-hemoglobin A1c is  5.6% and within normal limits.   History of vitamin D deficiency.  Plan is to return in 6 months for physical exam.

## 2010-11-11 DIAGNOSIS — R7302 Impaired glucose tolerance (oral): Secondary | ICD-10-CM | POA: Insufficient documentation

## 2010-11-11 DIAGNOSIS — E559 Vitamin D deficiency, unspecified: Secondary | ICD-10-CM | POA: Insufficient documentation

## 2010-11-11 DIAGNOSIS — M1712 Unilateral primary osteoarthritis, left knee: Secondary | ICD-10-CM | POA: Insufficient documentation

## 2010-11-11 NOTE — Patient Instructions (Signed)
We are pleased with your weight loss, hemoglobin A1c, and lipid panel. Keep up the good work.

## 2010-11-30 ENCOUNTER — Encounter (HOSPITAL_BASED_OUTPATIENT_CLINIC_OR_DEPARTMENT_OTHER): Admission: RE | Payer: Self-pay | Source: Ambulatory Visit

## 2010-11-30 ENCOUNTER — Ambulatory Visit (HOSPITAL_BASED_OUTPATIENT_CLINIC_OR_DEPARTMENT_OTHER): Admission: RE | Admit: 2010-11-30 | Payer: Medicare Other | Source: Ambulatory Visit | Admitting: Specialist

## 2010-11-30 SURGERY — MANIPULATION, KNEE, CLOSED
Anesthesia: General | Site: Knee | Laterality: Right

## 2011-01-15 HISTORY — PX: FOOT SURGERY: SHX648

## 2011-01-22 ENCOUNTER — Other Ambulatory Visit: Payer: Self-pay

## 2011-01-22 DIAGNOSIS — E785 Hyperlipidemia, unspecified: Secondary | ICD-10-CM

## 2011-01-22 MED ORDER — ATENOLOL 50 MG PO TABS: 50.0000 mg | ORAL_TABLET | Freq: Every day | ORAL | Status: DC

## 2011-01-22 MED ORDER — SIMVASTATIN 20 MG PO TABS: 20.0000 mg | ORAL_TABLET | Freq: Every evening | ORAL | Status: DC

## 2011-03-14 ENCOUNTER — Ambulatory Visit: Payer: Medicare Other | Admitting: Internal Medicine

## 2011-05-09 ENCOUNTER — Other Ambulatory Visit: Payer: Medicare Other | Admitting: Internal Medicine

## 2011-05-10 ENCOUNTER — Encounter: Payer: Medicare Other | Admitting: Internal Medicine

## 2011-05-14 ENCOUNTER — Other Ambulatory Visit: Payer: Self-pay | Admitting: Internal Medicine

## 2011-05-14 ENCOUNTER — Other Ambulatory Visit: Payer: Medicare Other | Admitting: Internal Medicine

## 2011-05-14 DIAGNOSIS — Z0000 Encounter for general adult medical examination without abnormal findings: Secondary | ICD-10-CM

## 2011-05-14 LAB — CBC WITH DIFFERENTIAL/PLATELET
Eosinophils Absolute: 0.2 10*3/uL (ref 0.0–0.7)
HCT: 42.7 % (ref 36.0–46.0)
Hemoglobin: 14.2 g/dL (ref 12.0–15.0)
Lymphs Abs: 1.1 10*3/uL (ref 0.7–4.0)
MCH: 30.3 pg (ref 26.0–34.0)
Monocytes Absolute: 0.4 10*3/uL (ref 0.1–1.0)
Monocytes Relative: 9 % (ref 3–12)
Neutrophils Relative %: 59 % (ref 43–77)
RBC: 4.68 MIL/uL (ref 3.87–5.11)

## 2011-05-14 LAB — LIPID PANEL
Cholesterol: 198 mg/dL (ref 0–200)
Triglycerides: 74 mg/dL (ref <150)

## 2011-05-14 LAB — COMPREHENSIVE METABOLIC PANEL
Albumin: 4.5 g/dL (ref 3.5–5.2)
CO2: 26 mEq/L (ref 19–32)
Glucose, Bld: 103 mg/dL — ABNORMAL HIGH (ref 70–99)
Potassium: 4.7 mEq/L (ref 3.5–5.3)
Sodium: 141 mEq/L (ref 135–145)
Total Bilirubin: 0.7 mg/dL (ref 0.3–1.2)
Total Protein: 6.7 g/dL (ref 6.0–8.3)

## 2011-05-16 ENCOUNTER — Encounter: Payer: Self-pay | Admitting: Internal Medicine

## 2011-05-16 ENCOUNTER — Ambulatory Visit (INDEPENDENT_AMBULATORY_CARE_PROVIDER_SITE_OTHER): Payer: Medicare Other | Admitting: Internal Medicine

## 2011-05-16 ENCOUNTER — Other Ambulatory Visit: Payer: Self-pay | Admitting: Gynecology

## 2011-05-16 ENCOUNTER — Encounter: Payer: Medicare Other | Admitting: Internal Medicine

## 2011-05-16 VITALS — BP 130/74 | HR 73 | Temp 97.7°F | Ht 65.0 in | Wt 203.0 lb

## 2011-05-16 DIAGNOSIS — M1990 Unspecified osteoarthritis, unspecified site: Secondary | ICD-10-CM

## 2011-05-16 DIAGNOSIS — R7302 Impaired glucose tolerance (oral): Secondary | ICD-10-CM

## 2011-05-16 DIAGNOSIS — I10 Essential (primary) hypertension: Secondary | ICD-10-CM

## 2011-05-16 DIAGNOSIS — E785 Hyperlipidemia, unspecified: Secondary | ICD-10-CM

## 2011-05-16 DIAGNOSIS — E669 Obesity, unspecified: Secondary | ICD-10-CM

## 2011-05-16 DIAGNOSIS — Z0000 Encounter for general adult medical examination without abnormal findings: Secondary | ICD-10-CM

## 2011-05-16 DIAGNOSIS — G4733 Obstructive sleep apnea (adult) (pediatric): Secondary | ICD-10-CM

## 2011-05-16 DIAGNOSIS — Z8639 Personal history of other endocrine, nutritional and metabolic disease: Secondary | ICD-10-CM

## 2011-05-16 DIAGNOSIS — Z1231 Encounter for screening mammogram for malignant neoplasm of breast: Secondary | ICD-10-CM

## 2011-05-16 DIAGNOSIS — Z8679 Personal history of other diseases of the circulatory system: Secondary | ICD-10-CM

## 2011-05-16 LAB — HEMOGLOBIN A1C: Hgb A1c MFr Bld: 5.5 % (ref <5.7)

## 2011-05-16 LAB — POCT URINALYSIS DIPSTICK
Bilirubin, UA: NEGATIVE
Ketones, UA: NEGATIVE
Leukocytes, UA: NEGATIVE

## 2011-06-02 ENCOUNTER — Encounter: Payer: Self-pay | Admitting: Internal Medicine

## 2011-06-02 NOTE — Progress Notes (Signed)
Subjective:    Patient ID: [name], female    DOB: 03/19/1945, 66 y.o.   MRN: [id number]  HPI pleasant 66 year old white female retired Engineer, civil (consulting) with history of trace mitral regurgitation and mitral valve prolapse diagnosed approximately 1983. History of GE reflux, history of obstructive sleep apnea, history of obesity, history of osteoarthritis of knee. History of L4-L5 microdiscectomy October 2004. History of benign right breast biopsy 1997. Tonsillectomy and adenoidectomy at age 79. Bilateral tubal ligation at age 44. She's had several knee surgeries including right knee torn meniscus February 2006, left knee arthroscopic surgery September 2010. Another left knee arthroscopic surgery October 2011. Underwent total knee replacement February 2012  Had Zostavax vaccine April 2011. Pneumovax October 2011. Tetanus immunization 2008. Gets annual influenza immunization.  Social history she is a widow. Husband died of brain cancer. She recently met a man and is enjoying going dancing with him has lost 25 pounds recently through diet and exercise.  Sulfa causes hives. Dr. Audie Box is GYN physician. History of right oophorectomy D&C and uterine polypectomy 2002.  Uses CPAP at night.  Patient does not smoke, social alcohol consumption. 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  Has 2 adult daughters, one of whom is a Optician, dispensing. Several grandchildren.    Review of Systems  Constitutional: Negative.   HENT: Negative.   Eyes: Negative.   Respiratory: Negative.   Cardiovascular: Negative.   Gastrointestinal: Negative.   Genitourinary: Negative.   Musculoskeletal: Negative.   Neurological: Negative.   Hematological: Negative.   Psychiatric/Behavioral: Negative.        Objective:   Physical Exam  Vitals reviewed. Constitutional: She is oriented to person, place, and time. She appears well-developed and well-nourished. No distress.    HENT:  Head: Normocephalic and atraumatic.  Right Ear: External ear normal.  Left Ear: External ear normal.  Mouth/Throat: Oropharynx is clear and moist.  Eyes: Conjunctivae and EOM are normal. Pupils are equal, round, and reactive to light. Right eye exhibits no discharge. Left eye exhibits no discharge. No scleral icterus.  Neck: Normal range of motion. Neck supple. No JVD present. No thyromegaly present.  Cardiovascular: Normal rate, regular rhythm and normal heart sounds.   No murmur heard.      No click appreciated. Patient is on Tenormin  Pulmonary/Chest: Breath sounds normal. No respiratory distress. She has no wheezes. She has no rales. She exhibits no tenderness.  Abdominal: Soft. Bowel sounds are normal. She exhibits no distension and no mass. There is no tenderness. There is no rebound and no guarding.  Genitourinary:       Deferred to GYN  Musculoskeletal: She exhibits no edema.        decreased range of motion of the knee after knee replacement  Lymphadenopathy:    She has no cervical adenopathy.  Neurological: She is alert and oriented to person, place, and time. She has normal reflexes. No cranial nerve deficit. Coordination normal.  Skin: Skin is warm and dry. No rash noted. She is not diaphoretic.  Psychiatric: She has a normal mood and affect. Her behavior is normal. Judgment and thought content normal.          Assessment & Plan:  History of mitral valve prolapse-asymptomatic on Tenormin which she is taking for many years  History of hypertension  Status post right knee replacement 2012  History of GE reflux  History of obstructive sleep apnea treated with CPAP  History  of glucose intolerance  Hyperlipidemia  Plan: Patient is doing well with weight loss, diet exercise. Seems happy now that she's found new female companion. Return in 6-12 months for followup on hyperlipidemia and impaired glucose tolerance. Continue same medications. Annual mammogram.  Recommend bone density study.

## 2011-06-02 NOTE — Patient Instructions (Signed)
Continue same medications and return in 6-12 months for office visit, lipid panel, liver functions and hemoglobin A1c. Continue diet exercise and weight loss

## 2011-06-11 ENCOUNTER — Ambulatory Visit (HOSPITAL_COMMUNITY)
Admission: RE | Admit: 2011-06-11 | Discharge: 2011-06-11 | Disposition: A | Discharge status: Home / Self Care | Payer: Medicare Other | Source: Ambulatory Visit | Attending: Gynecology | Admitting: Gynecology

## 2011-06-11 ENCOUNTER — Encounter: Payer: Self-pay | Admitting: Gynecology

## 2011-06-11 ENCOUNTER — Ambulatory Visit (INDEPENDENT_AMBULATORY_CARE_PROVIDER_SITE_OTHER): Payer: Medicare Other | Admitting: Gynecology

## 2011-06-11 VITALS — BP 112/70 | Ht 65.0 in | Wt 205.0 lb

## 2011-06-11 DIAGNOSIS — N949 Unspecified condition associated with female genital organs and menstrual cycle: Secondary | ICD-10-CM

## 2011-06-11 DIAGNOSIS — N952 Postmenopausal atrophic vaginitis: Secondary | ICD-10-CM

## 2011-06-11 DIAGNOSIS — Z1231 Encounter for screening mammogram for malignant neoplasm of breast: Secondary | ICD-10-CM | POA: Insufficient documentation

## 2011-06-11 DIAGNOSIS — N898 Other specified noninflammatory disorders of vagina: Secondary | ICD-10-CM

## 2011-06-11 DIAGNOSIS — N3946 Mixed incontinence: Secondary | ICD-10-CM

## 2011-06-11 LAB — WET PREP FOR TRICH, YEAST, CLUE
Trich, Wet Prep: NONE SEEN
Yeast Wet Prep HPF POC: NONE SEEN

## 2011-06-11 MED ORDER — SOLIFENACIN SUCCINATE 5 MG PO TABS: 5.0000 mg | ORAL_TABLET | Freq: Every day | ORAL | Status: DC

## 2011-06-11 MED ORDER — METRONIDAZOLE 500 MG PO TABS: 500.0000 mg | ORAL_TABLET | Freq: Two times a day (BID) | ORAL | Status: AC

## 2011-06-11 NOTE — Patient Instructions (Signed)
Start Vesicare as discussed. Call me if there are any issues. Follow up in one year for annual exam.

## 2011-06-11 NOTE — Progress Notes (Signed)
LENNON RICHINS [birth year] [id number]        66 y.o.  for follow up exam.  Several issues noted below.  Past medical history,surgical history, medications, allergies, family history and social history were all reviewed and documented in the EPIC chart. ROS:  Was performed and pertinent positives and negatives are included in the history.  Exam: Sherrilyn Rist chaperone present Filed Vitals:   06/11/11 1105  BP: 112/70   General appearance  Normal Skin grossly normal Head/Neck normal with no cervical or supraclavicular adenopathy thyroid normal Lungs  clear Cardiac RR, without RMG Abdominal  soft, nontender, without masses, organomegaly or hernia Breasts  examined lying and sitting without masses, retractions, discharge or axillary adenopathy. Pelvic  Ext/BUS/vagina  normal with atrophic genital changes  Cervix  normal with atrophic changes  Uterus  axial, normal size, shape and contour, midline and mobile nontender   Adnexa  Without masses or tenderness    Anus and perineum  normal   Rectovaginal  normal sphincter tone without palpated masses or tenderness.    Assessment/Plan:  66 y.o. female for follow up.    1. Urinary incontinence, mixed pattern. Patient has history of SUI type loss of urine with coughing sneezing laughing. Also some spontaneous leakage and frequent getting up at night to go with urgency symptoms. She was evaluated historically by urology with urodynamics and was started on Detrol but could not tolerate the dry mouth and stopped this. We reviewed various options. I gave her samples of Vesicare 5 mg to try three-week samples and annual prescription. If she does well with that she'll continue and if she continues to have issues I recommended she represent to urology for further evaluation. 2. Vaginal odor. Patient notes over the last several months a vaginal odor. No real discharge. Wet prep suggestive of a low-grade BV. We'll treat with Flagyl 500 twice a day x7 days,  alcohol avoidance reviewed. Follow up if symptoms persist. I reviewed with her I am not sure how much this is associated with her urinary leakage and wearing a pad constantly versus straightforward BV. 3. Atrophic genital changes. Patient's currently not sexually active and this is not an issue. We'll follow up if it becomes an issue. 4. Mammography. Patient has her mammogram scheduled today. We'll continue with annual mammography. SBE monthly reviewed. 5. Pap smear. Last Pap smear was 2012. No Pap smear was done today. She has numerous normal reports in her chart with no history of abnormal Pap smears in the past. I discussed current screening guidelines and will stop doing screening as she is over the age of 51. 6. DEXA. DEXA 06/2008 was normal. We'll repeat at a 5 year interval. Increase calcium vitamin D reviewed. 7. Colonoscopy. Patient is up-to-date with her colonoscopy and will follow up at their recommended interval. 8. Health maintenance. No blood work was sent today as it is all done through her primary physician's office.    Dara Lords MD, 12:43 PM 06/11/2011

## 2011-08-06 ENCOUNTER — Other Ambulatory Visit: Payer: Self-pay | Admitting: Internal Medicine

## 2011-08-08 ENCOUNTER — Other Ambulatory Visit: Payer: Self-pay

## 2011-10-16 ENCOUNTER — Ambulatory Visit (INDEPENDENT_AMBULATORY_CARE_PROVIDER_SITE_OTHER): Payer: Medicare Other | Admitting: Women's Health

## 2011-10-16 ENCOUNTER — Encounter: Payer: Self-pay | Admitting: Women's Health

## 2011-10-16 DIAGNOSIS — L089 Local infection of the skin and subcutaneous tissue, unspecified: Secondary | ICD-10-CM

## 2011-10-16 MED ORDER — CEPHALEXIN 250 MG PO CAPS: 250.0000 mg | ORAL_CAPSULE | Freq: Four times a day (QID) | ORAL | Status: DC

## 2011-10-16 MED ORDER — CEPHALEXIN 500 MG PO CAPS: 500.0000 mg | ORAL_CAPSULE | Freq: Three times a day (TID) | ORAL | Status: DC

## 2011-10-16 NOTE — Progress Notes (Signed)
Patient ID: [name], female   DOB: 01/18/1945, 66 y.o.   MRN: [id number] Presents with 2 complaints. States right great toe inflamed, tender to touch and swollen. Started about a week ago, has been soaking in epsome salts daily with minimal relief. Denies a fever or injury. Has been dancing several days weekly, which is a change. In a new relationship with an 7 year old gentleman with a history of a TUR with ED. She is a widow, has not been sexually active for greater than 5 years, has not had penile penetration and states is not able to reach an orgasm with only masterbaion from partner. Questions if there is any medications that could help. Denies problems with dryness, discharge or pain.  Exam: Right great toe warm, tender to touch, erythematous, nonindurated. No visible streaking, area contained to outer aspect and base of right great toe, no visible inflammation to inner aspect or discoloration to nail bed.  Postmenopausal sexuality Skin infection/paronychial infection  Plan: Discussed it could possibly be an ingrown toenail or paronychial infection. Instructed to continue soaking twice daily, loose shoes, Keflex 250 4 times a day for 7 days. Instructed if continues to seek care with dermatologist or foot specialist. Discussion on intimacy with aging/ED. States is very satisfied with relationship. Did review sex therapist, E.D. medications for partner per his primary care or to continue relationship as is.

## 2011-10-16 NOTE — Patient Instructions (Addendum)
Sexuality and Disability All people are sexual beings. In society today, sex is often associated with youth and physical fitness. Society may incorrectly view a person with a disability as not having sexual needs. People with disabilities may need to overcome:  Uninformed attitudes.  Their own misconceptions.  Personal challenges regarding sexuality. People with disabilities (physical, intellectual, or both) have just as much of a need for meaningful relationships, being loved, and showing love. Sexuality is a normal, important part of life. Sexuality includes:  Personality.  Thoughts.  Values.  Feelings. Sex drive, sexual activities, and the ability to communicate thoughts are all expressions of sexuality. Sex education is important for people with disabilities. Decisions about sexual expression should be made after sex education. Sexual intimacy involves much more than just intercourse, and quality sex does not need to end in an orgasm.Other forms of sexual expression may be used.  Some conditions in the early stages of disability may affect sexual desire. Those conditions may include:  Stress.  Fatigue.  Depression.  Anxiety.  Pain.  Fear. A disability may result in other changes which impact sexuality. Those changes include:  Movement problems.  Loss of feeling (sensation).  Communication problems.  Impaired bowel or bladder control.  Changes in behavior or thinking.  Changes in sexual functioning. This may include changes with erection, ejaculation, lubrication, or orgasm.  Role changes.  Changes in body image. Many other factors may affect sex drive and sexual function. These factors include:  Medicines.  Self-esteem (how you feel about yourself).  Quality of a relationship (how well people relate to each other).  Personal attitudes and values about sex. For a person with a disability, sexual activity may require planning. Planning may need to address  issues of:  Fatigue.  Pain.  Bowel routines.  Bladder routines. Strategies may include:  Trying different positions.  Having a partner play a more active role.  Trying different methods of pleasuring.  Using assistive (mechanical) devices. Communication is important in all relationships. It is very important for a partner to discuss his or her:  Thoughts.  Feelings.  Needs.  Wants.  Desires. People with disabilities are responsible for their sexual health. Healthy behaviors include:   Acting within your own value system to enjoy sexual expression within your comfort zone.  Preventing unplanned pregnancies.  Seeking early prenatal care.  Avoiding sexually transmitted disease.  Avoiding sexual abuse.  Maintaining regular follow up with caregivers. Sexuality is an intimate, personal area of our lives that may be difficult to talk about. However, if you have concerns or questions regarding sexuality, ask one of your caregivers. Many caregivers and groups are available to help you deal with sexuality and disability. A disability does not mean that this part of your life must come to an end. Document Released: 03/23/2002 Document Revised: 03/25/2011 Document Reviewed: 01/17/2010 [facility] Patient Information 2013 [place], [place].

## 2011-10-24 ENCOUNTER — Ambulatory Visit (INDEPENDENT_AMBULATORY_CARE_PROVIDER_SITE_OTHER): Payer: Medicare Other | Admitting: Internal Medicine

## 2011-10-24 ENCOUNTER — Encounter: Payer: Self-pay | Admitting: Internal Medicine

## 2011-10-24 VITALS — BP 128/78 | HR 72 | Temp 97.6°F | Wt 198.0 lb

## 2011-10-24 DIAGNOSIS — R6882 Decreased libido: Secondary | ICD-10-CM

## 2011-10-24 DIAGNOSIS — N951 Menopausal and female climacteric states: Secondary | ICD-10-CM

## 2011-10-24 NOTE — Patient Instructions (Addendum)
Begin Prempro 0.3/1.5 mg daily. Followup in 3 months. Call if have vaginal spotting. Influenza immunization given today.

## 2011-11-05 DIAGNOSIS — R6882 Decreased libido: Secondary | ICD-10-CM | POA: Insufficient documentation

## 2011-11-05 NOTE — Progress Notes (Signed)
  Subjective:    Patient ID: [name], female    DOB: 1945/10/22, 66 y.o.   MRN: [id number]  HPI 66 year old white female widow recently  met a man while going dancing. Her husband died of a brain tumor a few years ago. Man that she recently met is a retired Airline pilot. They've been having lots of fun and are planning a trip together. They have become intimate. She is concerned because his libido exceeds hers. She feels that she may need hormone replacement. She has some vaginal dryness.    Review of Systems     Objective:   Physical Exam spoke with patient for some 20 minutes about this issue. Says that she mentioned this to her as practitioner GYN office but they did not offer hormone replacement. Explained to patient that really all he had offer for this is either hormone replacement or Wellbutrin or both. She has been menopausal now for some years.        Assessment & Plan:  Menopause  Decreased libido  Plan: We'll try low-dose hormone replacement therapy consisting of Prempro 0.3 mg daily.

## 2011-11-26 ENCOUNTER — Ambulatory Visit: Payer: Medicare Other | Admitting: Internal Medicine

## 2011-12-10 ENCOUNTER — Other Ambulatory Visit: Payer: Self-pay | Admitting: Internal Medicine

## 2011-12-23 ENCOUNTER — Other Ambulatory Visit: Payer: Self-pay

## 2012-01-22 ENCOUNTER — Encounter (HOSPITAL_BASED_OUTPATIENT_CLINIC_OR_DEPARTMENT_OTHER): Payer: Self-pay | Admitting: *Deleted

## 2012-01-22 NOTE — Progress Notes (Signed)
To [facility] at 1100-Hg,Ekg on arrival -Npo after Mn-will take cymbalta with small amt water only.

## 2012-01-23 ENCOUNTER — Other Ambulatory Visit: Payer: Self-pay | Admitting: Orthopedic Surgery

## 2012-01-24 ENCOUNTER — Encounter (HOSPITAL_BASED_OUTPATIENT_CLINIC_OR_DEPARTMENT_OTHER): Payer: Self-pay | Admitting: Anesthesiology

## 2012-01-24 ENCOUNTER — Encounter (HOSPITAL_BASED_OUTPATIENT_CLINIC_OR_DEPARTMENT_OTHER)
Admission: RE | Disposition: A | Discharge status: Home / Self Care | Payer: Self-pay | Source: Ambulatory Visit | Attending: Specialist

## 2012-01-24 ENCOUNTER — Encounter (HOSPITAL_BASED_OUTPATIENT_CLINIC_OR_DEPARTMENT_OTHER): Payer: Self-pay | Admitting: *Deleted

## 2012-01-24 ENCOUNTER — Other Ambulatory Visit: Payer: Self-pay

## 2012-01-24 ENCOUNTER — Ambulatory Visit (HOSPITAL_BASED_OUTPATIENT_CLINIC_OR_DEPARTMENT_OTHER): Payer: Medicare Other | Admitting: Anesthesiology

## 2012-01-24 ENCOUNTER — Ambulatory Visit (HOSPITAL_BASED_OUTPATIENT_CLINIC_OR_DEPARTMENT_OTHER)
Admission: RE | Admit: 2012-01-24 | Discharge: 2012-01-24 | Disposition: A | Discharge status: Home / Self Care | Payer: Medicare Other | Source: Ambulatory Visit | Attending: Specialist | Admitting: Specialist

## 2012-01-24 DIAGNOSIS — M23302 Other meniscus derangements, unspecified lateral meniscus, unspecified knee: Secondary | ICD-10-CM | POA: Insufficient documentation

## 2012-01-24 DIAGNOSIS — Z96659 Presence of unspecified artificial knee joint: Secondary | ICD-10-CM | POA: Insufficient documentation

## 2012-01-24 DIAGNOSIS — G4730 Sleep apnea, unspecified: Secondary | ICD-10-CM | POA: Insufficient documentation

## 2012-01-24 DIAGNOSIS — M1710 Unilateral primary osteoarthritis, unspecified knee: Secondary | ICD-10-CM | POA: Insufficient documentation

## 2012-01-24 DIAGNOSIS — Z79899 Other long term (current) drug therapy: Secondary | ICD-10-CM | POA: Insufficient documentation

## 2012-01-24 DIAGNOSIS — M1712 Unilateral primary osteoarthritis, left knee: Secondary | ICD-10-CM

## 2012-01-24 HISTORY — DX: Tinnitus, unspecified ear: H93.19

## 2012-01-24 HISTORY — DX: Overactive bladder: N32.81

## 2012-01-24 HISTORY — PX: KNEE ARTHROSCOPY: SHX127

## 2012-01-24 HISTORY — DX: Stress incontinence (female) (male): N39.3

## 2012-01-24 HISTORY — DX: Sleep apnea, unspecified: G47.30

## 2012-01-24 LAB — POCT HEMOGLOBIN-HEMACUE: Hemoglobin: 13.2 g/dL (ref 12.0–15.0)

## 2012-01-24 SURGERY — ARTHROSCOPY, KNEE
Anesthesia: Regional | Site: Knee | Laterality: Left | Wound class: Clean

## 2012-01-24 MED ORDER — LACTATED RINGERS IV SOLN
INTRAVENOUS | Status: DC | PRN
  Administered 2012-01-24: 14:00:00

## 2012-01-24 MED ORDER — LACTATED RINGERS IV SOLN
INTRAVENOUS | Status: DC
  Filled 2012-01-24: qty 1000

## 2012-01-24 MED ORDER — PROMETHAZINE HCL 25 MG/ML IJ SOLN
6.2500 mg | INTRAMUSCULAR | Status: DC | PRN
  Filled 2012-01-24: qty 1

## 2012-01-24 MED ORDER — LACTATED RINGERS IV SOLN
INTRAVENOUS | Status: DC
  Administered 2012-01-24: 100 mL/h via INTRAVENOUS
  Filled 2012-01-24: qty 1000

## 2012-01-24 MED ORDER — ACETAMINOPHEN 10 MG/ML IV SOLN
1000.0000 mg | Freq: Once | INTRAVENOUS | Status: DC | PRN
  Filled 2012-01-24: qty 100

## 2012-01-24 MED ORDER — OXYCODONE HCL 5 MG/5ML PO SOLN
5.0000 mg | Freq: Once | ORAL | Status: DC | PRN
  Filled 2012-01-24: qty 5

## 2012-01-24 MED ORDER — SODIUM CHLORIDE 0.9 % IR SOLN
Status: DC | PRN
  Administered 2012-01-24: 14:00:00

## 2012-01-24 MED ORDER — DEXAMETHASONE SODIUM PHOSPHATE 4 MG/ML IJ SOLN
INTRAMUSCULAR | Status: DC | PRN
  Administered 2012-01-24: 10 mg via INTRAVENOUS

## 2012-01-24 MED ORDER — OXYCODONE HCL 5 MG PO TABS
5.0000 mg | ORAL_TABLET | Freq: Once | ORAL | Status: DC | PRN
  Filled 2012-01-24: qty 1

## 2012-01-24 MED ORDER — HYDROCODONE-ACETAMINOPHEN 5-325 MG PO TABS: 1.0000 | ORAL_TABLET | Freq: Four times a day (QID) | ORAL | Status: DC | PRN

## 2012-01-24 MED ORDER — MIDAZOLAM HCL 5 MG/5ML IJ SOLN
INTRAMUSCULAR | Status: DC | PRN
  Administered 2012-01-24: 2 mg via INTRAVENOUS

## 2012-01-24 MED ORDER — MEPERIDINE HCL 25 MG/ML IJ SOLN
6.2500 mg | INTRAMUSCULAR | Status: DC | PRN
  Filled 2012-01-24: qty 1

## 2012-01-24 MED ORDER — LIDOCAINE HCL (CARDIAC) 20 MG/ML IV SOLN
INTRAVENOUS | Status: DC | PRN
  Administered 2012-01-24: 80 mg via INTRAVENOUS

## 2012-01-24 MED ORDER — CHLORHEXIDINE GLUCONATE 4 % EX LIQD
60.0000 mL | Freq: Once | CUTANEOUS | Status: DC
  Filled 2012-01-24: qty 60

## 2012-01-24 MED ORDER — PROPOFOL 10 MG/ML IV BOLUS
INTRAVENOUS | Status: DC | PRN
  Administered 2012-01-24: 200 mg via INTRAVENOUS

## 2012-01-24 MED ORDER — CEFAZOLIN SODIUM-DEXTROSE 2-3 GM-% IV SOLR
2.0000 g | INTRAVENOUS | Status: AC
  Administered 2012-01-24: 2 g via INTRAVENOUS
  Filled 2012-01-24: qty 50

## 2012-01-24 MED ORDER — ROPIVACAINE HCL 5 MG/ML IJ SOLN
INTRAMUSCULAR | Status: DC | PRN
  Administered 2012-01-24: 30 mL via EPIDURAL

## 2012-01-24 MED ORDER — ACETAMINOPHEN 10 MG/ML IV SOLN
INTRAVENOUS | Status: DC | PRN
  Administered 2012-01-24: 1000 mg via INTRAVENOUS

## 2012-01-24 MED ORDER — BUPIVACAINE-EPINEPHRINE PF 0.5-1:200000 % IJ SOLN
INTRAMUSCULAR | Status: DC | PRN
  Administered 2012-01-24: 4 mL

## 2012-01-24 MED ORDER — HYDROMORPHONE HCL PF 1 MG/ML IJ SOLN
0.2500 mg | INTRAMUSCULAR | Status: DC | PRN
  Filled 2012-01-24: qty 1

## 2012-01-24 MED ORDER — CLINDAMYCIN PHOSPHATE 900 MG/50ML IV SOLN
900.0000 mg | Freq: Once | INTRAVENOUS | Status: AC
  Administered 2012-01-24: 900 mg via INTRAVENOUS
  Filled 2012-01-24: qty 50

## 2012-01-24 MED ORDER — SODIUM CHLORIDE 0.9 % IR SOLN
Status: DC | PRN
  Administered 2012-01-24: 6000 mL

## 2012-01-24 MED ORDER — FENTANYL CITRATE 0.05 MG/ML IJ SOLN
INTRAMUSCULAR | Status: DC | PRN
  Administered 2012-01-24: 100 ug via INTRAVENOUS

## 2012-01-24 SURGICAL SUPPLY — 41 items
BANDAGE ELASTIC 6 VELCRO ST LF (GAUZE/BANDAGES/DRESSINGS) ×2 IMPLANT
BLADE 4.2CUDA (BLADE) IMPLANT
BLADE CUDA SHAVER 3.5 (BLADE) ×2 IMPLANT
BLADE GREAT WHITE 4.2 (BLADE) IMPLANT
BOOTIES KNEE HIGH SLOAN (MISCELLANEOUS) ×1 IMPLANT
CANISTER SUCT LVC 12 LTR MEDI- (MISCELLANEOUS) ×2 IMPLANT
CANISTER SUCTION 1200CC (MISCELLANEOUS) IMPLANT
CANISTER SUCTION 2500CC (MISCELLANEOUS) IMPLANT
CLOTH BEACON ORANGE TIMEOUT ST (SAFETY) ×2 IMPLANT
DRAPE ARTHROSCOPY W/POUCH 114 (DRAPES) ×2 IMPLANT
DURAPREP 26ML APPLICATOR (WOUND CARE) ×2 IMPLANT
ELECT REM PT RETURN 9FT ADLT (ELECTROSURGICAL)
ELECTRODE REM PT RTRN 9FT ADLT (ELECTROSURGICAL) IMPLANT
GAUZE SPONGE 4X4 12PLY STRL LF (GAUZE/BANDAGES/DRESSINGS) ×1 IMPLANT
GLOVE INDICATOR 7.5 STRL GRN (GLOVE) ×2 IMPLANT
GLOVE SURG SS PI 8.0 STRL IVOR (GLOVE) ×2 IMPLANT
GOWN PREVENTION PLUS LG XLONG (DISPOSABLE) ×2 IMPLANT
GOWN STRL REIN XL XLG (GOWN DISPOSABLE) ×2 IMPLANT
IV NS IRRIG 3000ML ARTHROMATIC (IV SOLUTION) ×5 IMPLANT
KNEE WRAP E Z 3 GEL PACK (MISCELLANEOUS) ×2 IMPLANT
MINI VAC (SURGICAL WAND) IMPLANT
NDL FILTER BLUNT 18X1 1/2 (NEEDLE) ×1 IMPLANT
NDL SAFETY ECLIPSE 18X1.5 (NEEDLE) ×2 IMPLANT
NEEDLE FILTER BLUNT 18X 1/2SAF (NEEDLE) ×1
NEEDLE FILTER BLUNT 18X1 1/2 (NEEDLE) ×1 IMPLANT
NEEDLE HYPO 18GX1.5 SHARP (NEEDLE) ×2
PACK ARTHROSCOPY DSU (CUSTOM PROCEDURE TRAY) ×2 IMPLANT
PACK BASIN DAY SURGERY FS (CUSTOM PROCEDURE TRAY) ×2 IMPLANT
PADDING CAST ABS 6INX4YD NS (CAST SUPPLIES) ×1
PADDING CAST ABS COTTON 6X4 NS (CAST SUPPLIES) IMPLANT
PADDING CAST COTTON 6X4 STRL (CAST SUPPLIES) ×2 IMPLANT
RESECTOR FULL RADIUS 4.2MM (BLADE) IMPLANT
SET ARTHROSCOPY TUBING (MISCELLANEOUS) ×2
SET ARTHROSCOPY TUBING LN (MISCELLANEOUS) ×1 IMPLANT
SPONGE GAUZE 4X4 12PLY (GAUZE/BANDAGES/DRESSINGS) ×2 IMPLANT
SUT ETHILON 4 0 PS 2 18 (SUTURE) ×2 IMPLANT
SYR 30ML LL (SYRINGE) ×2 IMPLANT
SYRINGE 10CC LL (SYRINGE) ×2 IMPLANT
TOWEL OR 17X24 6PK STRL BLUE (TOWEL DISPOSABLE) ×2 IMPLANT
WAND 90 DEG TURBOVAC W/CORD (SURGICAL WAND) IMPLANT
WATER STERILE IRR 500ML POUR (IV SOLUTION) ×2 IMPLANT

## 2012-01-24 NOTE — Anesthesia Procedure Notes (Addendum)
Anesthesia Regional Block:  Femoral nerve block  Pre-Anesthetic Checklist: ,, timeout performed, Correct Patient, Correct Site, Correct Laterality, Correct Procedure, Correct Position, site marked, Risks and benefits discussed, Surgical consent,  At surgeon's request and post-op pain management  Laterality: Left  Prep: chloraprep       Needles:  Injection technique: Single-shot  Needle Type: Stimiplex     Needle Length: 10cm 10 cm Needle Gauge: 22 and 22 G  Needle insertion depth: 6 cm   Additional Needles:  Procedures: ultrasound guided (picture in chart) and nerve stimulator Femoral nerve block  Nerve Stimulator or Paresthesia:  Response: Twitch elicited, 0.7 mA,   Additional Responses:   Narrative:  Start time: 01/24/2012 12:55 PM End time: 01/24/2012 1:02 PM  Events: blood aspirated  Performed by: Personally  Anesthesiologist: Renold Don, MD  Additional Notes: BP cuff, EKG monitors applied. Sedation begun. Femoral artery palpated for location of nerve. After nerve location anesthetic injected incrementally under ultrasound guidance, slowly , and after neg aspirations. Tolerated well.  Femoral nerve block Procedure Name: LMA Insertion Date/Time: 01/24/2012 1:15 PM Performed by: Maris Berger T Pre-anesthesia Checklist: Patient identified, Emergency Drugs available, Suction available and Patient being monitored Patient Re-evaluated:Patient Re-evaluated prior to inductionOxygen Delivery Method: Circle System Utilized Preoxygenation: Pre-oxygenation with 100% oxygen Intubation Type: IV induction Ventilation: Mask ventilation without difficulty LMA: LMA inserted LMA Size: 4.0 Number of attempts: 1 Placement Confirmation: positive ETCO2 Dental Injury: Teeth and Oropharynx as per pre-operative assessment  Comments: Gauze roll between teeth

## 2012-01-24 NOTE — H&P (Signed)
[name] is an 67 y.o. female.   Chief Complaint: left knee pain HPI: Pain refractory to PT injections etc. Severe DJD PF joint  Past Medical History  Diagnosis Date  . Mitral valve prolapse     since age 41  . Stress incontinence in female     wears a pad  . Overactive bladder   . Ringing in ears 2010    interfers with clarity of hearing at times  . Sleep apnea     mild -never tolerated cpap    Past Surgical History  Procedure Date  . Foot surgery 2013    CORRECTION OF "HAMMER TOES" -LEFT FOOT  . Hand surgery     RIGHT THUMB BONE SPUR  . Knee arthroscopy 2006, 2010, 2012    LEFT KNEE IN  2010/ RIGHT KNEE IN 2006 AND 2012  . Breast surgery     BENIGN RIGHT BR MASS  . Tubal ligation   . Oophorectomy     RIGHT OVARY REMOVED  . Total knee arthroplasty 2012    Right    Family History  Problem Relation Age of Onset  . Hypertension Mother   . Heart disease Mother   . Kidney failure Mother   . Other Mother     sclaraderma  . Diabetes Maternal Grandmother     TYPE 2  . Hypertension Maternal Grandmother   . Other Maternal Grandmother     abdominal anersym   Social History:  reports that she has never smoked. She has never used smokeless tobacco. She reports that she drinks alcohol. She reports that she does not use illicit drugs.  Allergies:  Allergies  Allergen Reactions  . Sulfa Antibiotics Hives  . Codeine Itching  . Morphine     REACTION: itch  . Sulfonamide Derivatives     REACTION: itch    Medications Prior to Admission  Medication Sig Dispense Refill  . atenolol (TENORMIN) 50 MG tablet TAKE 1 TABLET BY MOUTH DAILY  90 tablet  4  . Calcium-Magnesium-Vitamin D 600-40-500 MG-MG-UNIT TB24 Take by mouth.        Marland Kitchen CALCIUM-VITAMIN D PO Take by mouth.        Marland Kitchen CHIA SEED PO Take by mouth.      . co-enzyme Q-10 50 MG capsule Take 50 mg by mouth 2 (two) times daily.        . CYMBALTA 30 MG capsule TAKE 3 CAPSULES BY MOUTH DAILY .  270 capsule  0  .  estrogens conjugated, synthetic A, (CENESTIN) 0.3 MG tablet Take 0.2 mg by mouth daily.      . Flaxseed, Linseed, (EQL FLAX SEED OIL) 1000 MG CAPS Take by mouth.      Marland Kitchen glucosamine-chondroitin 500-400 MG tablet Take 1 tablet by mouth 3 (three) times daily.        . Multiple Vitamin (MULTIVITAMIN PO) Take by mouth.        . simvastatin (ZOCOR) 20 MG tablet TAKE 1 TABLET BY MOUTH EVERY EVENING  90 tablet  4  . TURMERIC PO Take by mouth.        . cephALEXin (KEFLEX) 250 MG capsule Take 1 capsule (250 mg total) by mouth 4 (four) times daily.  28 capsule  0  . DULoxetine HCl (CYMBALTA PO) Take 30 mg by mouth 3 (three) times daily.       . Evening Primrose Oil 1000 MG CAPS Take by mouth.        . magnesium gluconate (  MAGONATE) 500 MG tablet Take 500 mg by mouth 2 (two) times daily.        . solifenacin (VESICARE) 5 MG tablet Take 1 tablet (5 mg total) by mouth daily.  30 tablet  11  . vitamin C (ASCORBIC ACID) 500 MG tablet Take 500 mg by mouth daily.          Results for orders placed during the hospital encounter of 01/24/12 (from the past 48 hour(s))  POCT HEMOGLOBIN-HEMACUE     Status: Normal   Collection Time   01/24/12 12:11 PM      Component Value Range Comment   Hemoglobin 13.2  12.0 - 15.0 g/dL    No results found.  Review of Systems  Musculoskeletal: Positive for joint pain.  All other systems reviewed and are negative.    Blood pressure 121/71, pulse 55, temperature 97 F (36.1 C), temperature source Oral, resp. rate 18, height 5\' 6"  (1.676 m), weight 91.853 kg (202 lb 8 oz), SpO2 100.00%. Physical Exam  Constitutional: She is oriented to person, place, and time. She appears well-developed.  HENT:  Head: Normocephalic.  Eyes: Pupils are equal, round, and reactive to light.  Neck: Normal range of motion.  Cardiovascular: Normal rate.   Respiratory: Effort normal.  GI: Soft.  Musculoskeletal: She exhibits tenderness.       MJLT, +EFFUSION. +McMurray left knee    Neurological: She is alert and oriented to person, place, and time.  Skin: Skin is warm and dry.  Psychiatric: She has a normal mood and affect.   + PF pain. MRI severe PF DJD Assessment/Plan Left knee Sx chondral flap tear possible meniscus tear. Plan Arthroscopy. Risks discussed.  Nafeesah Lapaglia C 01/24/2012, 12:58 PM

## 2012-01-24 NOTE — Anesthesia Preprocedure Evaluation (Addendum)
Anesthesia Evaluation  Patient identified by MRN, date of birth, ID band Patient awake    Reviewed: Allergy & Precautions, H&P , NPO status , Patient's Chart, lab work & pertinent test results, reviewed documented beta blocker date and time   History of Anesthesia Complications Negative for: history of anesthetic complications  Airway Mallampati: II TM Distance: >3 FB Neck ROM: Full    Dental  (+) Dental Advisory Given and Partial Upper   Pulmonary sleep apnea ,  breath sounds clear to auscultation  Pulmonary exam normal       Cardiovascular hypertension, Pt. on home beta blockers and Pt. on medications - Past MI and - CHF + Valvular Problems/Murmurs MVP Rhythm:Regular Rate:Normal     Neuro/Psych PSYCHIATRIC DISORDERS negative neurological ROS     GI/Hepatic negative GI ROS, Neg liver ROS,   Endo/Other  negative endocrine ROS  Renal/GU negative Renal ROS     Musculoskeletal  (+) Arthritis -, Osteoarthritis,    Abdominal (+) + obese,   Peds  Hematology negative hematology ROS (+)   Anesthesia Other Findings   Reproductive/Obstetrics                          Anesthesia Physical Anesthesia Plan  ASA: II  Anesthesia Plan: General and Regional   Post-op Pain Management:    Induction: Intravenous  Airway Management Planned: LMA  Additional Equipment:   Intra-op Plan:   Post-operative Plan: Extubation in OR  Informed Consent: I have reviewed the patients History and Physical, chart, labs and discussed the procedure including the risks, benefits and alternatives for the proposed anesthesia with the patient or authorized representative who has indicated his/her understanding and acceptance.   Dental advisory given  Plan Discussed with: CRNA  Anesthesia Plan Comments:        Anesthesia Quick Evaluation

## 2012-01-24 NOTE — Brief Op Note (Signed)
01/24/2012  1:48 PM  PATIENT:  [name]  67 y.o. female  PRE-OPERATIVE DIAGNOSIS:  left knee djd and pain  POST-OPERATIVE DIAGNOSIS:  left knee djd and pain  PROCEDURE:  Procedure(s) (LRB) with comments: ARTHROSCOPY KNEE (Left) - left knee arthroscopy with debridment, partial lateral menisectomy  SURGEON:  Surgeon(s) and Role:    * Javier Docker, MD - Primary  PHYSICIAN ASSISTANT:   ASSISTANTS: none   ANESTHESIA:   general  EBL:  Total I/O In: 100 [I.V.:100] Out: -   BLOOD ADMINISTERED:none  DRAINS: none   LOCAL MEDICATIONS USED:  MARCAINE     SPECIMEN:  No Specimen  DISPOSITION OF SPECIMEN:  N/A  COUNTS:  YES  TOURNIQUET:  * No tourniquets in log *  DICTATION: .Other Dictation: Dictation Number [id number]  PLAN OF CARE: Discharge to home after PACU  PATIENT DISPOSITION:  PACU - hemodynamically stable.   Delay start of Pharmacological VTE agent (>24hrs) due to surgical blood loss or risk of bleeding: no

## 2012-01-24 NOTE — Transfer of Care (Signed)
Immediate Anesthesia Transfer of Care Note  Patient: [name]  Procedure(s) Performed: Procedure(s) (LRB) with comments: ARTHROSCOPY KNEE (Left) - left knee arthroscopy with debridment, partial lateral menisectomy  Patient Location: PACU  Anesthesia Type:General  Level of Consciousness: awake, alert  and oriented  Airway & Oxygen Therapy: Patient Spontanous Breathing and Patient connected to nasal cannula oxygen  Post-op Assessment: Report given to PACU RN  Post vital signs: Reviewed and stable  Complications: No apparent anesthesia complications

## 2012-01-25 NOTE — Anesthesia Postprocedure Evaluation (Signed)
Anesthesia Post Note  Patient: [name]  Procedure(s) Performed: Procedure(s) (LRB): ARTHROSCOPY KNEE (Left)  Anesthesia type: General with regional (FNB)  Patient location: PACU  Post pain: Pain level 0/10  Post assessment: Post-op Vital signs reviewed  Last Vitals: BP 111/74  Pulse 60  Temp 36.1 C (Oral)  Resp 16  Ht 5\' 6"  (1.676 m)  Wt 202 lb 8 oz (91.853 kg)  BMI 32.68 kg/m2  SpO2 98%  Post vital signs: Reviewed  Level of consciousness: sedated  Complications: No apparent anesthesia complications

## 2012-01-27 ENCOUNTER — Encounter (HOSPITAL_BASED_OUTPATIENT_CLINIC_OR_DEPARTMENT_OTHER): Payer: Self-pay | Admitting: Specialist

## 2012-01-27 NOTE — Op Note (Signed)
NAMEFAITHE, [name] NO.:  [id number]  MEDICAL RECORD NO.:  [id number]  LOCATION:                                 FACILITY:  PHYSICIAN:  Jene Every, M.D.         DATE OF BIRTH:  DATE OF PROCEDURE:  01/24/2012 DATE OF DISCHARGE:                              OPERATIVE REPORT   PREOPERATIVE DIAGNOSIS:  Symptomatic osteoarthritis with degenerative meniscus tear of the left knee.  POSTOPERATIVE DIAGNOSIS:  Symptomatic osteoarthritis with degenerative meniscus tear of the left knee with extensive grade 4 changes of patellofemoral joint, medial compartment, lateral meniscus tear.  PROCEDURE PERFORMED: 1. Left knee arthroscopy. 2. Partial lateral meniscectomy. 3. Debridement with chondroplasty of medial femoral condyle, lateral     femoral condyle.  ANESTHESIA:  General.  ASSISTANT:  No assistant.  HISTORY:  This 67 year old with symptomatic osteoarthritis with knee mechanical symptoms was indicated of arthroscopic debridement. Addressing mechanical symptoms, though the patient was noted to have severe patellofemoral arthrosis and discussion concerning total knee since it had occurred.  The patient has had a history of debridement and followed by visco supplementation that has helped her in the past and she chose that as opposed to proceeding with the knee replacement.  Due to mechanical symptoms temporary from a corticosteroid injection, we felt that was reasonable.  We discussed risks and benefits including bleeding, infection, DVT, PE, no change in symptoms, worsening symptoms, etc.  DESCRIPTION OF PROCEDURE:  Placed in supine position, after induction of adequate general anesthesia, 2 g Kefzol, and 900 clindamycin due to history of MRSA exposure.  The left lower extremity was prepped and draped in the usual sterile fashion.  A lateral parapatellar portal was fashioned with a #11 blade.  Ingress cannula atraumatically placed. Irrigant was utilized to  insufflate the joint.  The patient in addition received a preoperative femoral nerve block, by her request.  Under direct visualization, medial parapatellar portal was fashioned with a #11 blade after localization with an 18-gauge needle sparing the medial meniscus.  Noted medial compartment with extensive grade 4 changes of the femoral condyle and the tibia, very low remnant of the meniscus. Fraying of the ACL was noted.  Lateral compartment revealed extensive grade 3 changes and degenerative tearing of the lateral meniscus predominantly posterior third and middle third.  We introduced a shaver and debrided the meniscectomy to a stable base.  We performed chondroplasty of femoral condyle and tibial plateau.  Remnant of meniscus was stable to probe palpation.  Suprapatellar pouch revealed near pan grade 4 changes of the patellofemoral joint.  There was normal patellofemoral tracking. Gutters were unremarkable.  Copiously lavaged knee.  Revisited all compartments, no further pathology amenable to arthroscopic intervention.  I, therefore, removed all instrumentation.  Portals were closed with 4-0 nylon simple sutures.  Copious Marcaine with epinephrine was infiltrated in the joint.  Wound was dressed sterilely, woken without difficulty and transported to the recovery in satisfactory condition.  The patient tolerated the procedure well.  No complications.  No assistant.  Minimal blood loss.     Jene Every, M.D.     [name]  D:  01/24/2012  T:  01/25/2012  Job:  [id number] 

## 2012-02-17 IMAGING — CR DG KNEE 1-2V*R*
2 series · 2 of 2 positions shown · non-contrast
Comparison: None.

CLINICAL DATA: Preoperative films for patient with osteoarthritis.

RIGHT KNEE - 1-2 VIEW

[t knee ap right]
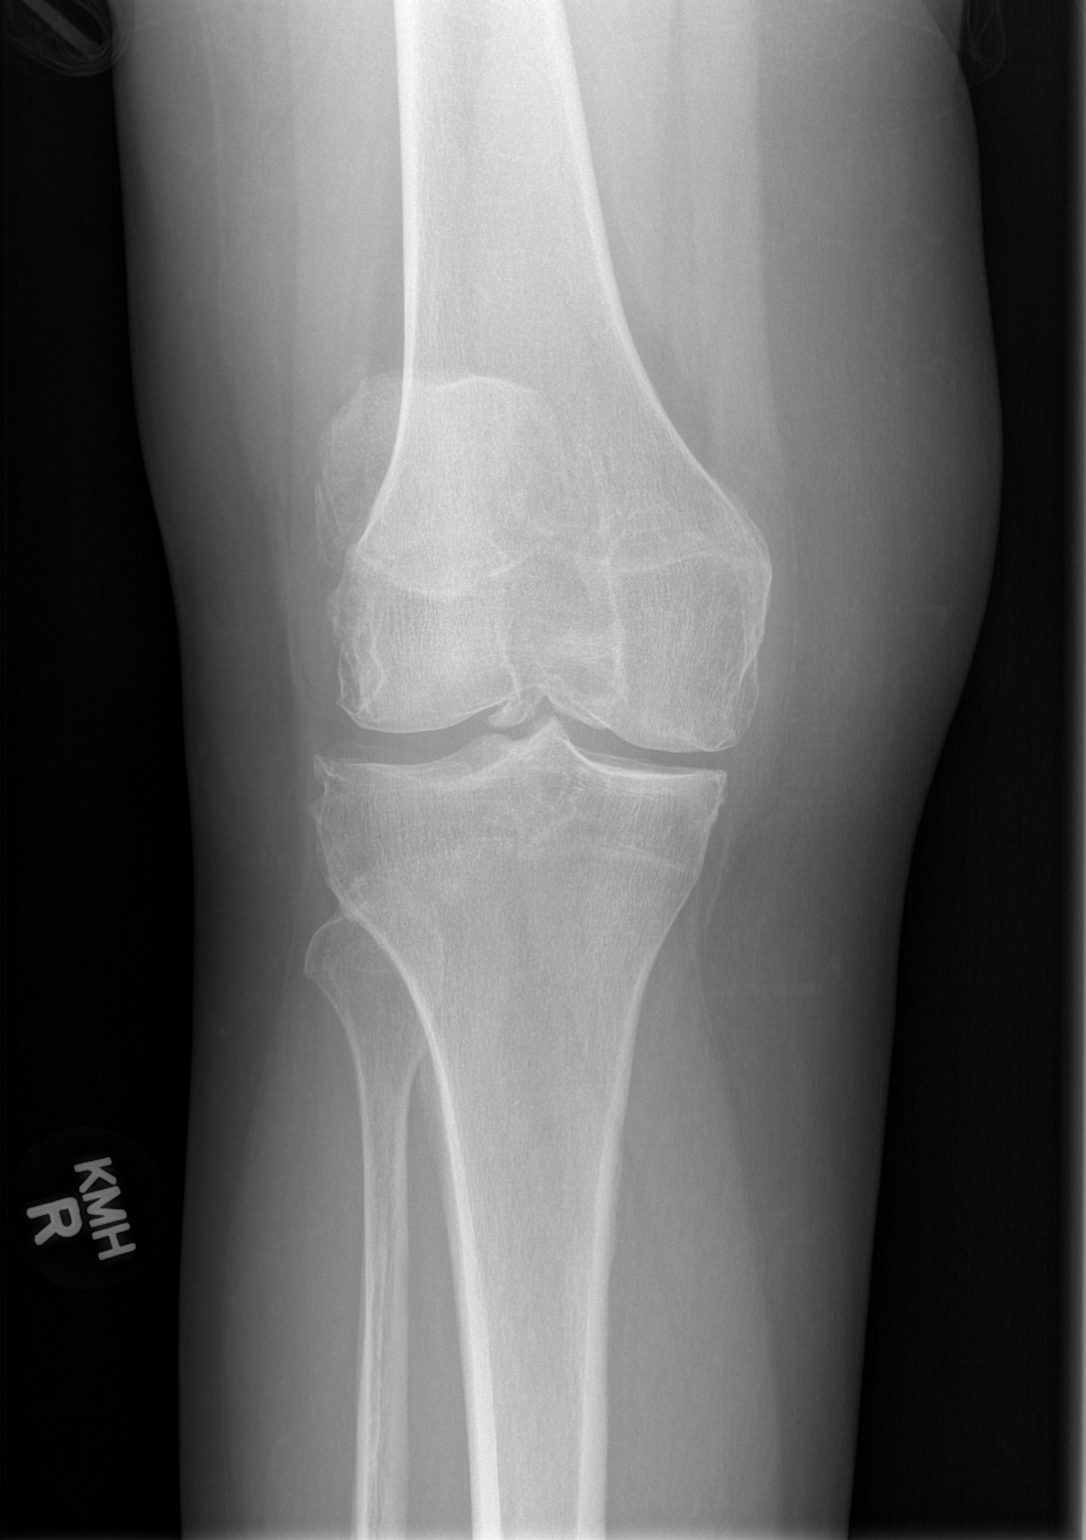

[t knee lat right]
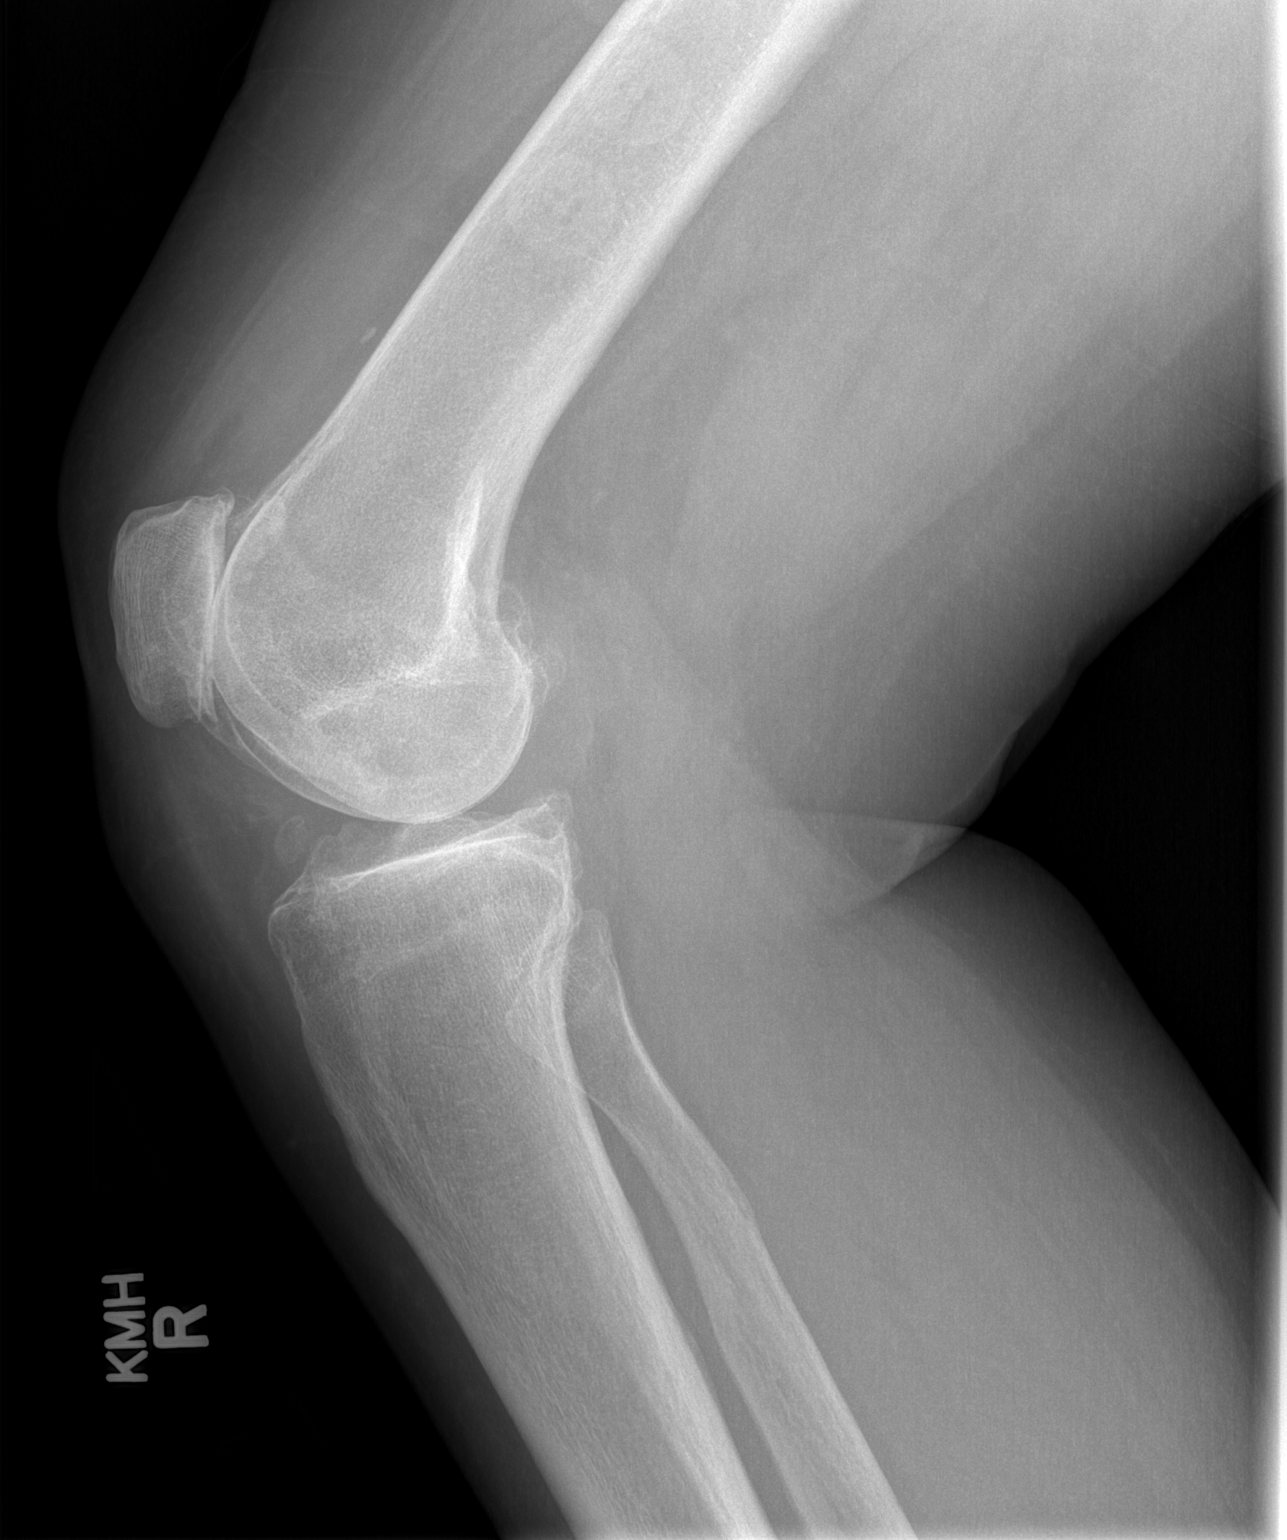

[2 of 2 positions shown; findings below may reference images not displayed]

FINDINGS: No fracture or dislocation is identified.  The patient
has tricompartmental degenerative change appearing worst in the
patellofemoral compartment.  There is a loose body measuring 1 cm
in diameter in the anterior aspect of the joint centrally.
IMPRESSION: No acute finding.  Tricompartmental osteoarthritis.

## 2012-03-02 ENCOUNTER — Other Ambulatory Visit: Payer: Self-pay | Admitting: Internal Medicine

## 2012-03-04 ENCOUNTER — Other Ambulatory Visit: Payer: Self-pay | Admitting: Internal Medicine

## 2012-03-04 ENCOUNTER — Other Ambulatory Visit: Payer: Self-pay

## 2012-03-04 MED ORDER — CONJ ESTROG-MEDROXYPROGEST ACE 0.3-1.5 MG PO TABS: 1.0000 | ORAL_TABLET | Freq: Every day | ORAL | Status: DC

## 2012-03-04 NOTE — Telephone Encounter (Signed)
I thought we did this by fax  Recently.

## 2012-04-01 ENCOUNTER — Other Ambulatory Visit: Payer: Self-pay | Admitting: Internal Medicine

## 2012-04-17 ENCOUNTER — Other Ambulatory Visit: Payer: Self-pay | Admitting: Internal Medicine

## 2012-04-21 ENCOUNTER — Other Ambulatory Visit: Payer: Self-pay | Admitting: Gynecology

## 2012-04-21 DIAGNOSIS — Z1231 Encounter for screening mammogram for malignant neoplasm of breast: Secondary | ICD-10-CM

## 2012-05-18 ENCOUNTER — Other Ambulatory Visit: Payer: Medicare Other | Admitting: Internal Medicine

## 2012-05-18 DIAGNOSIS — Z0000 Encounter for general adult medical examination without abnormal findings: Secondary | ICD-10-CM

## 2012-05-18 DIAGNOSIS — R7301 Impaired fasting glucose: Secondary | ICD-10-CM

## 2012-05-18 DIAGNOSIS — E785 Hyperlipidemia, unspecified: Secondary | ICD-10-CM

## 2012-05-18 LAB — COMPREHENSIVE METABOLIC PANEL
AST: 16 U/L (ref 0–37)
Albumin: 4.2 g/dL (ref 3.5–5.2)
Alkaline Phosphatase: 65 U/L (ref 39–117)
BUN: 20 mg/dL (ref 6–23)
Potassium: 4.3 mEq/L (ref 3.5–5.3)
Sodium: 140 mEq/L (ref 135–145)
Total Bilirubin: 0.5 mg/dL (ref 0.3–1.2)
Total Protein: 6.5 g/dL (ref 6.0–8.3)

## 2012-05-18 LAB — LIPID PANEL
HDL: 49 mg/dL (ref >39)
LDL Cholesterol: 127 mg/dL — ABNORMAL HIGH (ref 0–99)
VLDL: 17 mg/dL (ref 0–40)

## 2012-05-18 LAB — CBC WITH DIFFERENTIAL/PLATELET
Basophils Absolute: 0 10*3/uL (ref 0.0–0.1)
Basophils Relative: 1 % (ref 0–1)
Eosinophils Absolute: 0.3 10*3/uL (ref 0.0–0.7)
MCHC: 34.6 g/dL (ref 30.0–36.0)
Neutro Abs: 2.3 10*3/uL (ref 1.7–7.7)
Neutrophils Relative %: 54 % (ref 43–77)
RDW: 13.4 % (ref 11.5–15.5)

## 2012-05-18 LAB — TSH: TSH: 2.138 u[IU]/mL (ref 0.350–4.500)

## 2012-05-18 LAB — HEMOGLOBIN A1C: Mean Plasma Glucose: 108 mg/dL (ref <117)

## 2012-05-19 ENCOUNTER — Ambulatory Visit (INDEPENDENT_AMBULATORY_CARE_PROVIDER_SITE_OTHER): Payer: Medicare Other | Admitting: Internal Medicine

## 2012-05-19 ENCOUNTER — Encounter: Payer: Self-pay | Admitting: Internal Medicine

## 2012-05-19 VITALS — BP 110/72 | Temp 98.2°F | Ht 66.0 in | Wt 201.0 lb

## 2012-05-19 DIAGNOSIS — Z8739 Personal history of other diseases of the musculoskeletal system and connective tissue: Secondary | ICD-10-CM

## 2012-05-19 DIAGNOSIS — Z0000 Encounter for general adult medical examination without abnormal findings: Secondary | ICD-10-CM

## 2012-05-19 DIAGNOSIS — Z87898 Personal history of other specified conditions: Secondary | ICD-10-CM

## 2012-05-19 DIAGNOSIS — E785 Hyperlipidemia, unspecified: Secondary | ICD-10-CM

## 2012-05-19 DIAGNOSIS — G4733 Obstructive sleep apnea (adult) (pediatric): Secondary | ICD-10-CM

## 2012-05-19 DIAGNOSIS — I059 Rheumatic mitral valve disease, unspecified: Secondary | ICD-10-CM

## 2012-05-19 DIAGNOSIS — M1710 Unilateral primary osteoarthritis, unspecified knee: Secondary | ICD-10-CM

## 2012-05-19 DIAGNOSIS — I341 Nonrheumatic mitral (valve) prolapse: Secondary | ICD-10-CM

## 2012-05-19 DIAGNOSIS — Z7189 Other specified counseling: Secondary | ICD-10-CM

## 2012-05-19 DIAGNOSIS — IMO0002 Reserved for concepts with insufficient information to code with codable children: Secondary | ICD-10-CM

## 2012-05-19 DIAGNOSIS — Z79890 Hormone replacement therapy (postmenopausal): Secondary | ICD-10-CM

## 2012-05-19 DIAGNOSIS — Z862 Personal history of diseases of the blood and blood-forming organs and certain disorders involving the immune mechanism: Secondary | ICD-10-CM

## 2012-05-19 DIAGNOSIS — M1712 Unilateral primary osteoarthritis, left knee: Secondary | ICD-10-CM

## 2012-05-19 LAB — POCT URINALYSIS DIPSTICK
Bilirubin, UA: NEGATIVE
Blood, UA: NEGATIVE
Glucose, UA: NEGATIVE
Nitrite, UA: NEGATIVE
Spec Grav, UA: 1.02
pH, UA: 5.5

## 2012-05-19 MED ORDER — DULOXETINE HCL 30 MG PO CPEP: 30.0000 mg | ORAL_CAPSULE | Freq: Every day | ORAL | Status: DC

## 2012-05-19 MED ORDER — KETOCONAZOLE 2 % EX CREA: TOPICAL_CREAM | Freq: Two times a day (BID) | CUTANEOUS | Status: DC

## 2012-05-19 NOTE — Progress Notes (Signed)
Subjective:    Patient ID: [name], female    DOB: 1945/10/28, 67 y.o.   MRN: [id number]  HPI  For health maintenance and evaluation of medical problems. Seeing 67 year old man and is quite happy. History trace mitral regurgitation and mitral valve prolapse diagnosed 1983. History of GE reflux, history of obstructive sleep apnea, obesity, osteoarthritis of knee.  History of L4-L5 microdiscectomy October 2004.  History of benign right breast biopsy 1997. Tonsillectomy and adenoidectomy at age 56. Bilateral tubal ligation at age 4.  She has had several knee surgeries including right knee torn meniscus February 2006, left knee arthroscopic surgery September 2010, another left knee arthroscopic surgery October 2011. Underwent total knee replacement February 2012.  History of right oophorectomy D&C and uterine polypectomy 2002  Had Zostavax vaccine April 2011, Pneumovax immunization October 2011, tetanus immunization 2008. Gets annual influenza immunization.  Social history: She is a widow. Husband died of brain cancer. She has an older gentleman she sees frequently. They are sexually active. She does not smoke. Social alcohol consumption. He spent some time as a Orthoptist at the hospital. Does other types of theological work. Has 2 adult daughters one of whom is a Optician, dispensing. Several grandchildren.  Family history: Is divorced when she was 7 years also she does not know about bothers family history. Mother died at age 22 of heart failure kidney failure and scleroderma. She is an only child. Maternal grandmother died at age 108 of abdominal aneurysm rupture.  Sulfa causes hives.  Uses CPAP at night      Review of Systems  Constitutional: Negative.   HENT: Negative.   Eyes: Negative.   Respiratory: Negative.   Cardiovascular: Negative.   Gastrointestinal: Negative.   Endocrine: Negative.   Genitourinary:       Libido increased with ERT  Musculoskeletal:       Left knee pain-  new- arthroscopy done January knee replacement advised.  Right knee s/p total knee replacement.  Allergic/Immunologic: Positive for environmental allergies.  Hematological: Negative.   Psychiatric/Behavioral: Negative.        Objective:   Physical Exam  Vitals reviewed. Constitutional: She is oriented to person, place, and time. She appears well-developed and well-nourished. No distress.  HENT:  Head: Normocephalic and atraumatic.  Right Ear: External ear normal.  Left Ear: External ear normal.  Mouth/Throat: Oropharynx is clear and moist. No oropharyngeal exudate.  Eyes: Conjunctivae and EOM are normal. Pupils are equal, round, and reactive to light. Right eye exhibits no discharge. Left eye exhibits no discharge. No scleral icterus.  Neck: Neck supple. No JVD present. No thyromegaly present.  Cardiovascular: Normal rate, regular rhythm, normal heart sounds and intact distal pulses.   No murmur heard. No click appreciated she is on Tenormin  Pulmonary/Chest: Effort normal and breath sounds normal. No respiratory distress. She has no rales.  Breasts normal female  Abdominal: Soft. Bowel sounds are normal. She exhibits no distension and no mass. There is no tenderness. There is no rebound and no guarding.  Genitourinary:  Bimanual normal  Musculoskeletal: Normal range of motion. She exhibits no edema.  Lymphadenopathy:    She has no cervical adenopathy.  Neurological: She is alert and oriented to person, place, and time. She displays normal reflexes. No cranial nerve deficit. Coordination normal.  Skin: Skin is warm and dry. No rash noted. She is not diaphoretic.  Psychiatric: She has a normal mood and affect. Her behavior is normal. Judgment and thought content normal.  Assessment & Plan:  Intertrigo  History of mitral valve prolapse  Decreased libido-treated with estrogen replacement and improved  Sleep apnea  GE reflux  Musculoskeletal pain secondary to  osteoarthritis involving knee and back  History of impaired glucose tolerance  Elevated LDL cholesterol  Plan: Return in one year or as needed. Continue diet exercise and weight loss efforts  She has an elevated LDL cholesterol of 127. Needs to work on diet and exercise. TSH is normal. Vitamin D is normal. Glucose is normal and improved from 103 in 2013 and 121 and 2012   Plan: Nizoral cream to groin daily with prn refills Subjective:   Patient presents for Medicare Annual/Subsequent preventive examination.   Review Past Medical/Family/Social:  See EPIC   Risk Factors  Current exercise habits: work outdoors limited due to left knee pain Dietary issues discussed: Paleo diet but came off of it  Cardiac risk factors:  Depression Screen  (Note: if answer to either of the following is "Yes", a more complete depression screening is indicated)   Over the past two weeks, have you felt down, depressed or hopeless? No  Over the past two weeks, have you felt little interest or pleasure in doing things? No Have you lost interest or pleasure in daily life? No Do you often feel hopeless? No Do you cry easily over simple problems? No   Activities of Daily Living  In your present state of health, do you have any difficulty performing the following activities?:   Driving? No  Managing money? No  Feeding yourself? No  Getting from bed to chair? No  Climbing a flight of stairs? No  Preparing food and eating?: No  Bathing or showering? No  Getting dressed: No  Getting to the toilet? No  Using the toilet:No  Moving around from place to place: No  In the past year have you fallen or had a near fall?:No  Are you sexually active? yes Do you have more than one partner? No   Hearing Difficulties: No  Do you often ask people to speak up or repeat themselves? No  Unless in noisy room Do you experience ringing or noises in your ears? Yes- had hearing checked and hearing aids were not  advised Do you have difficulty understanding soft or whispered voices? yes Do you feel that you have a problem with memory? No Do you often misplace items? No    Home Safety:  Do you have a smoke alarm at your residence? Yes Do you have grab bars in the bathroom? no Do you have throw rugs in your house? yes   Cognitive Testing  Alert? Yes Normal Appearance?Yes  Oriented to person? Yes Place? Yes  Time? Yes  Recall of three objects? Yes  Can perform simple calculations? Yes  Displays appropriate judgment?Yes  Can read the correct time from a watch face?Yes   List the Names of Other Physician/Practitioners you currently use:  See referral list for the physicians patient is currently seeing.  Dr. Audie Box GYN    Review of Systems:   Objective:     General appearance: Appears stated age and mildly obese  Head: Normocephalic, without obvious abnormality, atraumatic  Eyes: conj clear, EOMi PEERLA  Ears: normal TM's and external ear canals both ears  Nose: Nares normal. Septum midline. Mucosa normal. No drainage or sinus tenderness.  Throat: lips, mucosa, and tongue normal; teeth and gums normal  Neck: no adenopathy, no carotid bruit, no JVD, supple, symmetrical, trachea midline and thyroid  not enlarged, symmetric, no tenderness/mass/nodules  No CVA tenderness.  Lungs: clear to auscultation bilaterally  Breasts: normal appearance, no masses or tenderness. Heart: regular rate and rhythm, S1, S2 normal, no murmur, click, rub or gallop  Abdomen: soft, non-tender; bowel sounds normal; no masses, no organomegaly  Musculoskeletal: ROM normal in all joints, no crepitus, no deformity, Normal muscle strengthen. Back  is symmetric, no curvature. Skin: Skin color, texture, turgor normal. No rashes or lesions  Lymph nodes: Cervical, supraclavicular, and axillary nodes normal.  Neurologic: CN 2 -12 Normal, Normal symmetric reflexes. Normal coordination and gait  Psych: Alert & Oriented x  3, Mood appear stable.    Assessment:    Annual wellness medicare exam   Plan:    During the course of the visit the patient was educated and counseled about appropriate screening and preventive services including:   Annual mammogram Routine colonoscopy as suggested by GI Bone density study q 3 years Annual flu vaccine     Patient Instructions (the written plan) was given to the patient.  Medicare Attestation  I have personally reviewed:  The patient's medical and social history  Their use of alcohol, tobacco or illicit drugs  Their current medications and supplements  The patient's functional ability including ADLs,fall risks, home safety risks, cognitive, and hearing and visual impairment  Diet and physical activities  Evidence for depression or mood disorders  The patient's weight, height, BMI, and visual acuity have been recorded in the chart. I have made referrals, counseling, and provided education to the patient based on review of the above and I have provided the patient with a written personalized care plan for preventive services.

## 2012-06-11 ENCOUNTER — Encounter: Payer: Self-pay | Admitting: Gynecology

## 2012-06-11 ENCOUNTER — Ambulatory Visit (HOSPITAL_COMMUNITY)
Admission: RE | Admit: 2012-06-11 | Discharge: 2012-06-11 | Disposition: A | Discharge status: Home / Self Care | Payer: Medicare Other | Source: Ambulatory Visit | Attending: Gynecology | Admitting: Gynecology

## 2012-06-11 ENCOUNTER — Ambulatory Visit (INDEPENDENT_AMBULATORY_CARE_PROVIDER_SITE_OTHER): Payer: Medicare Other | Admitting: Gynecology

## 2012-06-11 VITALS — BP 122/74 | Ht 66.0 in | Wt 200.0 lb

## 2012-06-11 DIAGNOSIS — Z1231 Encounter for screening mammogram for malignant neoplasm of breast: Secondary | ICD-10-CM

## 2012-06-11 DIAGNOSIS — Z79890 Hormone replacement therapy (postmenopausal): Secondary | ICD-10-CM

## 2012-06-11 DIAGNOSIS — N3281 Overactive bladder: Secondary | ICD-10-CM

## 2012-06-11 DIAGNOSIS — N952 Postmenopausal atrophic vaginitis: Secondary | ICD-10-CM

## 2012-06-11 DIAGNOSIS — N318 Other neuromuscular dysfunction of bladder: Secondary | ICD-10-CM

## 2012-06-11 MED ORDER — CONJ ESTROG-MEDROXYPROGEST ACE 0.3-1.5 MG PO TABS: ORAL_TABLET | ORAL | Status: DC

## 2012-06-11 NOTE — Progress Notes (Signed)
[name] 03-Apr-1945 [id number]        67 y.o.  G2P2002 for followup exam.  Several issues that below.   Past medical history,surgical history, medications, allergies, family history and social history were all reviewed and documented in the EPIC chart. ROS:  Was performed and pertinent positives and negatives are included in the history.  Exam: [name] assistant Filed Vitals:   06/11/12 1501  BP: 122/74  Height: 5\' 6"  (1.676 m)  Weight: 200 lb (90.719 kg)   General appearance  Normal Skin grossly normal Head/Neck normal with no cervical or supraclavicular adenopathy thyroid normal Lungs  clear Cardiac RR, without RMG Abdominal  soft, nontender, without masses, organomegaly or hernia Breasts  examined lying and sitting without masses, retractions, discharge or axillary adenopathy. Pelvic  Ext/BUS/vagina  normal with atrophic changes  Cervix  normal with atrophic changes  Uterus  grossly normal size, midline and mobile nontender   Adnexa  Without masses or tenderness    Anus and perineum  normal   Rectovaginal  normal sphincter tone without palpated masses or tenderness.    Assessment/Plan:  67 y.o. [id number] female for followup exam.   1. HRT. Patient previously on Vivelle and Prometrium. Had stopped them and noticed a significant decrease in libido. She is now in a relationship and had talked to her primary physician Dr. Eden Emms Baxley who ultimately started her on Prempro 3/1.5. She has done well and notes a return of her libido and she wants to continue this. I reviewed the WHI study with her and the risks to include stroke heart attack DVT and breast cancer. Advantages of transdermal over oral first pass effect. After lengthy discussion the patient wants to continue on her Prempro, understands and accepts the risks and I refilled her times year. 2. Urinary incontinence, mixed pattern. Patient has history of SUI type loss of urine with coughing sneezing laughing. Also some  spontaneous leakage and frequent getting up at night to go with urgency symptoms. She was evaluated historically by urology with urodynamics and was started on Detrol but could not tolerate the dry mouth and stopped this. I ultimately started her on VESIcare last year she did well with this and uses it intermittently. She will let me know when she needs a refill. 3. DEXA 06/2008 normal. Plan repeat next year a 10 year interval. Increase calcium vitamin D reviewed. 4. Pap smear 2012. No Pap smear done today. No history of significant abnormal Pap smears. Reviewed current screening guidelines and plan to stop screening as she is over the age of 67 and she is comfortable with this. 5. Mammography today. Continue with annual mammography. SBE monthly reviewed. 6. Colonoscopy 2007. Planned repeat at 10 year interval. 7. Health maintenance. No lab work done as this is all done through her primary physician's office. Followup one year, sooner as needed.    Dara Lords MD, 4:39 PM 06/11/2012

## 2012-06-11 NOTE — Patient Instructions (Addendum)
Follow up in one year for annual exam 

## 2012-06-14 ENCOUNTER — Other Ambulatory Visit: Payer: Self-pay | Admitting: Internal Medicine

## 2012-06-16 ENCOUNTER — Other Ambulatory Visit: Payer: Self-pay

## 2012-10-17 NOTE — Patient Instructions (Addendum)
Continue same medications. Continue diet exercise and weight loss efforts. Return in one year.

## 2012-10-28 ENCOUNTER — Other Ambulatory Visit: Payer: Self-pay | Admitting: Internal Medicine

## 2012-11-04 ENCOUNTER — Ambulatory Visit (INDEPENDENT_AMBULATORY_CARE_PROVIDER_SITE_OTHER): Payer: Medicare Other | Admitting: Internal Medicine

## 2012-11-04 DIAGNOSIS — Z23 Encounter for immunization: Secondary | ICD-10-CM

## 2012-11-30 ENCOUNTER — Other Ambulatory Visit: Payer: Medicare Other | Admitting: Internal Medicine

## 2012-11-30 ENCOUNTER — Other Ambulatory Visit: Payer: Self-pay | Admitting: Internal Medicine

## 2012-11-30 DIAGNOSIS — E785 Hyperlipidemia, unspecified: Secondary | ICD-10-CM

## 2012-11-30 DIAGNOSIS — R7302 Impaired glucose tolerance (oral): Secondary | ICD-10-CM

## 2012-11-30 DIAGNOSIS — Z79899 Other long term (current) drug therapy: Secondary | ICD-10-CM

## 2012-11-30 LAB — LIPID PANEL
HDL: 52 mg/dL (ref >39)
LDL Cholesterol: 112 mg/dL — ABNORMAL HIGH (ref 0–99)
Total CHOL/HDL Ratio: 3.5 Ratio
Triglycerides: 82 mg/dL (ref <150)
VLDL: 16 mg/dL (ref 0–40)

## 2012-11-30 LAB — HEPATIC FUNCTION PANEL
Bilirubin, Direct: 0.1 mg/dL (ref 0.0–0.3)
Indirect Bilirubin: 0.6 mg/dL (ref 0.0–0.9)
Total Bilirubin: 0.7 mg/dL (ref 0.3–1.2)

## 2012-12-01 ENCOUNTER — Ambulatory Visit (INDEPENDENT_AMBULATORY_CARE_PROVIDER_SITE_OTHER): Payer: Medicare Other | Admitting: Internal Medicine

## 2012-12-01 ENCOUNTER — Other Ambulatory Visit: Payer: Self-pay | Admitting: Internal Medicine

## 2012-12-01 ENCOUNTER — Encounter: Payer: Self-pay | Admitting: Internal Medicine

## 2012-12-01 VITALS — BP 160/90 | HR 60 | Temp 97.6°F | Ht 66.0 in | Wt 207.0 lb

## 2012-12-01 DIAGNOSIS — E785 Hyperlipidemia, unspecified: Secondary | ICD-10-CM

## 2012-12-01 DIAGNOSIS — I341 Nonrheumatic mitral (valve) prolapse: Secondary | ICD-10-CM

## 2012-12-01 DIAGNOSIS — R7309 Other abnormal glucose: Secondary | ICD-10-CM

## 2012-12-01 DIAGNOSIS — M1712 Unilateral primary osteoarthritis, left knee: Secondary | ICD-10-CM

## 2012-12-01 DIAGNOSIS — Z8669 Personal history of other diseases of the nervous system and sense organs: Secondary | ICD-10-CM

## 2012-12-01 DIAGNOSIS — G4733 Obstructive sleep apnea (adult) (pediatric): Secondary | ICD-10-CM

## 2012-12-01 DIAGNOSIS — E559 Vitamin D deficiency, unspecified: Secondary | ICD-10-CM

## 2012-12-01 DIAGNOSIS — R7302 Impaired glucose tolerance (oral): Secondary | ICD-10-CM

## 2012-12-01 DIAGNOSIS — I059 Rheumatic mitral valve disease, unspecified: Secondary | ICD-10-CM

## 2012-12-01 DIAGNOSIS — R030 Elevated blood-pressure reading, without diagnosis of hypertension: Secondary | ICD-10-CM

## 2012-12-01 DIAGNOSIS — M1710 Unilateral primary osteoarthritis, unspecified knee: Secondary | ICD-10-CM

## 2012-12-01 LAB — BASIC METABOLIC PANEL
CO2: 25 mEq/L (ref 19–32)
Calcium: 9 mg/dL (ref 8.4–10.5)
Creat: 0.86 mg/dL (ref 0.50–1.10)
Glucose, Bld: 91 mg/dL (ref 70–99)
Sodium: 140 mEq/L (ref 135–145)

## 2012-12-01 NOTE — Patient Instructions (Addendum)
Monitor blood pressure at home.  CPE due in May. Continue same medications. Check with GYN about bone density study

## 2012-12-01 NOTE — Progress Notes (Signed)
  Subjective:    Patient ID: [name], female    DOB: [birth year], 67 y.o.   MRN: [id number]  HPI In today for six-month recheck on empiric glucose tolerance, osteoarthritis of left knee, mitral valve prolapse, sleep apnea, hyperlipidemia. She has stopped taking estrogen replacement. Libido has been fine off the estrogen replacement. She uses Voltaren gel on her left knee for pain. She saw orthopedist recently and she is not ready to have knee replacement yet. Today her blood pressure is elevated bilaterally at 160/90 in each arm. She's not sure why. She does take Tenormin for history of mitral valve prolapse. I have asked her to obtain a home blood pressure monitor and to call with readings.  Regarding bone density study, says this is done by GYN physician Dr. Audie Box. She has had influenza immunization this season.  Lipid panel liver functions are stable. Hemoglobin A1c is stable. Impaired glucose tolerance controlled by diet.    Review of Systems     Objective:   Physical Exam Neck is supple without thyromegaly JVD or carotid bruits. Chest clear. Cardiac exam regular rate and rhythm. No click appreciated. Extremities without edema. Skin is warm and dry.       Assessment & Plan:  Osteoarthritis left knee followed by Dr. Shelle Iron  Elevated blood pressure-patient to monitor at home with home blood pressure monitor and call with readings  History of mitral valve prolapse treated with beta blocker  Hyperlipidemia-treated with statin therapy  Impaired glucose tolerance treated with diet  History of sleep apnea  History of vitamin D deficiency  Plan: Continue same medications and return in 6 months for physical exam. Is to monitor blood pressure at home and call with results

## 2012-12-09 ENCOUNTER — Telehealth: Payer: Self-pay | Admitting: Internal Medicine

## 2012-12-09 NOTE — Telephone Encounter (Signed)
Last visit patient had elevated blood pressure. She's been taking Tenormin at night for history of mitral valve prolapse. She sends in multiple blood pressure readings by fax today. Readings are 138/75, 135/79, 124/75, 133/70, 126/75, 150/82 and 150/85. Higher readings are in the mornings. I have contacted her today and told her to take Tenormin every morning rather than at bedtime. She will continue to monitor her blood pressure and let me know how it is doing.

## 2013-01-22 ENCOUNTER — Ambulatory Visit: Payer: Medicare Other | Admitting: Internal Medicine

## 2013-01-25 ENCOUNTER — Other Ambulatory Visit: Payer: Self-pay

## 2013-01-25 MED ORDER — ATENOLOL 50 MG PO TABS: 50.0000 mg | ORAL_TABLET | Freq: Every day | ORAL | Status: DC

## 2013-01-25 MED ORDER — CYMBALTA 30 MG PO CPEP: 30.0000 mg | ORAL_CAPSULE | Freq: Every day | ORAL | Status: DC

## 2013-01-25 MED ORDER — SIMVASTATIN 20 MG PO TABS: 20.0000 mg | ORAL_TABLET | Freq: Every day | ORAL | Status: DC

## 2013-01-25 MED ORDER — CYMBALTA 60 MG PO CPEP: 60.0000 mg | ORAL_CAPSULE | Freq: Every day | ORAL | Status: DC

## 2013-02-15 ENCOUNTER — Ambulatory Visit (INDEPENDENT_AMBULATORY_CARE_PROVIDER_SITE_OTHER): Payer: Medicare Other | Admitting: Internal Medicine

## 2013-02-15 ENCOUNTER — Encounter: Payer: Self-pay | Admitting: Internal Medicine

## 2013-02-15 VITALS — BP 118/76 | HR 72 | Temp 97.5°F | Wt 204.0 lb

## 2013-02-15 DIAGNOSIS — L600 Ingrowing nail: Secondary | ICD-10-CM | POA: Diagnosis not present

## 2013-02-15 DIAGNOSIS — I10 Essential (primary) hypertension: Secondary | ICD-10-CM

## 2013-02-15 MED ORDER — LOSARTAN POTASSIUM 25 MG PO TABS: 25.0000 mg | ORAL_TABLET | Freq: Every day | ORAL | Status: DC

## 2013-02-15 NOTE — Patient Instructions (Signed)
Put cotton up under ingrown toe nail. Start Losartan 25 mg daily in addition to Tenormin RTC 4 weeks.

## 2013-02-15 NOTE — Progress Notes (Signed)
   Subjective:    Patient ID: [name], female    DOB: September 10, 1945, 69 y.o.   MRN: [id number]  HPI patient has been having issues with labile hypertension. She brings in multiple blood pressure readings. Has been on Tenormin 50 mg daily for a number of years for history of mitral valve prolapse. Has noticed blood pressure elevated at 169/89 January 11, 160/82 January 14, 154/79 January 17, 149/86 January 27. Other blood pressure readings have ranged from 299  to 242 systolically and from 70 to 81 diastolically. His pressures elevate when she is stressed.  Also complaining of ingrown toenail. It is not infected. However it's painful to wear certain shoes.    Review of Systems     Objective:   Physical Exam Skin is warm and dry. Chest clear to auscultation. Cardiac exam regular rate and rhythm no murmur clicks or gallops. Extremities without edema. Left ingrown toenail medial aspect without infection.           Assessment & Plan:   Labile hypertension  Ingrown toenail  Plan: Is to soak foot in warm soapy water for 20 minutes and then put cotton up under ingrown part of toe nail until it grows out. Add Cozaar 25 mg daily 2 Tenormin 50 mg daily. Return in 4 weeks for basic metabolic panel and office visit.

## 2013-02-20 ENCOUNTER — Other Ambulatory Visit: Payer: Self-pay | Admitting: Internal Medicine

## 2013-03-09 ENCOUNTER — Ambulatory Visit (INDEPENDENT_AMBULATORY_CARE_PROVIDER_SITE_OTHER): Payer: Medicare Other | Admitting: Internal Medicine

## 2013-03-09 ENCOUNTER — Encounter: Payer: Self-pay | Admitting: Internal Medicine

## 2013-03-09 VITALS — BP 128/76 | HR 60 | Temp 98.0°F | Wt 203.0 lb

## 2013-03-09 DIAGNOSIS — N899 Noninflammatory disorder of vagina, unspecified: Secondary | ICD-10-CM

## 2013-03-09 DIAGNOSIS — I10 Essential (primary) hypertension: Secondary | ICD-10-CM

## 2013-03-09 DIAGNOSIS — R300 Dysuria: Secondary | ICD-10-CM | POA: Diagnosis not present

## 2013-03-09 DIAGNOSIS — N898 Other specified noninflammatory disorders of vagina: Secondary | ICD-10-CM

## 2013-03-09 LAB — POCT URINALYSIS DIPSTICK
Bilirubin, UA: NEGATIVE
Blood, UA: NEGATIVE
Glucose, UA: NEGATIVE
KETONES UA: NEGATIVE
Leukocytes, UA: NEGATIVE
Nitrite, UA: NEGATIVE
PH UA: 5.5
Protein, UA: NEGATIVE
SPEC GRAV UA: 1.02
Urobilinogen, UA: NEGATIVE

## 2013-03-09 MED ORDER — CLOTRIMAZOLE-BETAMETHASONE 1-0.05 % EX CREA: 1.0000 "application " | TOPICAL_CREAM | Freq: Two times a day (BID) | CUTANEOUS | Status: DC

## 2013-03-09 NOTE — Patient Instructions (Signed)
Continue Cozaar 25 mg daily along with Tenormin daily. Please check with GYN for further evaluation of GYN symptoms and urge urinary incontinence. Return in the next few days for basic metabolic panel when phlebotomist is here

## 2013-03-09 NOTE — Progress Notes (Signed)
   Subjective:    Patient ID: [name], female    DOB: September 05, 1945, 68 y.o.   MRN: [id number]  HPI At last visit, Cozaar 25 mg daily was added to Tenormin because of labile blood pressure. Blood pressure has improved except for yesterday the patient says it was elevated in the 150 range, she was stressed. Needs to have basic metabolic panel drawn on Cozaar and phlebotomist is here. She has an appointment next week for followup on hypertension but she may cancel that.  She is here today complaining of dysuria. Urinalysis is normal. Has noticed some vaginal burning when she urinates. No frequency. No vaginal discharge. Dr. Phineas Real is GYN physician. She has urge urinary incontinence and wears a pad.    Review of Systems     Objective:   Physical Exam She has several abraded areas inner labia majora and labia minora that her small. I also think she may have a cervical prolapse. Bimanual exam appears to be normal.       Assessment & Plan:  Irritation external genitalia  Hypertension-improved with adding Cozaar  Plan: She will come in for be met in the near future and cancel appointment next week for blood pressure followup. Continue to monitor blood pressure at home on this regimen. For external genitalia irritation, Lotrisone cream to use sparingly twice daily. I have asked her to check with Dr. Phineas Real for GYN exam says she's having urge urinary incontinence and may possibly have a cervical prolapse.

## 2013-03-12 ENCOUNTER — Other Ambulatory Visit: Payer: Medicare Other | Admitting: Internal Medicine

## 2013-03-14 DIAGNOSIS — A6000 Herpesviral infection of urogenital system, unspecified: Secondary | ICD-10-CM

## 2013-03-14 HISTORY — DX: Herpesviral infection of urogenital system, unspecified: A60.00

## 2013-03-15 ENCOUNTER — Other Ambulatory Visit: Payer: Medicare Other | Admitting: Internal Medicine

## 2013-03-15 DIAGNOSIS — I10 Essential (primary) hypertension: Secondary | ICD-10-CM | POA: Diagnosis not present

## 2013-03-16 ENCOUNTER — Ambulatory Visit: Payer: Medicare Other | Admitting: Internal Medicine

## 2013-03-16 ENCOUNTER — Encounter: Payer: Self-pay | Admitting: Gynecology

## 2013-03-16 ENCOUNTER — Ambulatory Visit (INDEPENDENT_AMBULATORY_CARE_PROVIDER_SITE_OTHER): Payer: Medicare Other | Admitting: Gynecology

## 2013-03-16 DIAGNOSIS — N762 Acute vulvitis: Secondary | ICD-10-CM

## 2013-03-16 DIAGNOSIS — N760 Acute vaginitis: Secondary | ICD-10-CM | POA: Diagnosis not present

## 2013-03-16 DIAGNOSIS — N766 Ulceration of vulva: Secondary | ICD-10-CM | POA: Diagnosis not present

## 2013-03-16 LAB — BASIC METABOLIC PANEL
BUN: 22 mg/dL (ref 6–23)
CO2: 28 meq/L (ref 19–32)
Calcium: 9 mg/dL (ref 8.4–10.5)
Chloride: 106 mEq/L (ref 96–112)
Creat: 0.76 mg/dL (ref 0.50–1.10)
GLUCOSE: 90 mg/dL (ref 70–99)
Potassium: 4.4 mEq/L (ref 3.5–5.3)
SODIUM: 143 meq/L (ref 135–145)

## 2013-03-16 MED ORDER — VALACYCLOVIR HCL 1 G PO TABS: 1000.0000 mg | ORAL_TABLET | Freq: Two times a day (BID) | ORAL | Status: DC

## 2013-03-16 MED ORDER — FLUCONAZOLE 200 MG PO TABS: 200.0000 mg | ORAL_TABLET | Freq: Every day | ORAL | Status: DC

## 2013-03-16 MED ORDER — DOXYCYCLINE HYCLATE 100 MG PO CAPS: 100.0000 mg | ORAL_CAPSULE | Freq: Two times a day (BID) | ORAL | Status: DC

## 2013-03-16 NOTE — Addendum Note (Signed)
Addended by: Nelva Nay on: 03/16/2013 03:16 PM   Modules accepted: Orders

## 2013-03-16 NOTE — Progress Notes (Signed)
[name] 1945-03-14 [id number]        67 y.o.  [id number] Patient presents with a one-week history of vulvar irritation. Saw Dr. Renold Genta and there was some question as to whether she had cervical prolapse. She has been using cream externally for the irritation but knows it makes it burn worse. No history of same before. Does use knee pads daily for urinary incontinence but no change in habits or products in the past several years.   Past medical history,surgical history, problem list, medications, allergies, family history and social history were all reviewed and documented in the EPIC chart.  Exam: [name] assistant General appearance  Normal External with desquamating vulvitis primarily right groin region from labia majora to thigh with patches of skin abraded. Multiple small ulcerated desquamated areas over both labia minora. BUS vagina normal without evidence of vaginal involvement. No significant discharge. Cervix grossly normal. Bimanual uterus normal size midline mobile nontender. Adnexa without masses or tenderness. Without evidence of gross prolapse.  Assessment/Plan:  68 y.o. G2P2002  desquamating type vulvitis. Questionable etiology. Regular culture and viral culture taken of several areas. Will cover for differential to include bacterial with Vibramycin 100 mg twice a day x10 days. Viral with Valtrex thousand milligrams twice a day x10 days and fungal with Diflucan 200 mg daily x5 days. Recommend sitz baths 4 times a day with hairdryer drying afterwards. Neosporin to the larger desquamated areas. Followup in one week regardless. Sooner if symptoms appear to be worsening.   Note: This document was prepared with digital dictation and possible smart phrase technology. Any transcriptional errors that result from this process are unintentional.   Anastasio Auerbach MD, 3:08 PM 03/16/2013

## 2013-03-16 NOTE — Patient Instructions (Signed)
Take Vibramycin antibiotic twice daily for 10 days Take Valtrex pill twice daily for 10 days Take Diflucan pill daily for 5 days Sitz baths 3-4 times daily. Followup if symptoms worsens. Otherwise followup in one week.

## 2013-03-18 ENCOUNTER — Telehealth: Payer: Self-pay | Admitting: Gynecology

## 2013-03-18 LAB — WOUND CULTURE: GRAM STAIN: NONE SEEN

## 2013-03-18 NOTE — Telephone Encounter (Signed)
I called patient in followup to see how she is doing. She states that her irritation seems to be improving and getting better. She is to see me next week in followup regardless.

## 2013-03-23 ENCOUNTER — Encounter: Payer: Self-pay | Admitting: Gynecology

## 2013-03-25 ENCOUNTER — Encounter: Payer: Self-pay | Admitting: Gynecology

## 2013-03-25 ENCOUNTER — Ambulatory Visit (INDEPENDENT_AMBULATORY_CARE_PROVIDER_SITE_OTHER): Payer: Medicare Other | Admitting: Gynecology

## 2013-03-25 DIAGNOSIS — A6000 Herpesviral infection of urogenital system, unspecified: Secondary | ICD-10-CM | POA: Diagnosis not present

## 2013-03-25 MED ORDER — SILVER SULFADIAZINE 1 % EX CREA: 1.0000 "application " | TOPICAL_CREAM | Freq: Every day | CUTANEOUS | Status: DC

## 2013-03-25 MED ORDER — VALACYCLOVIR HCL 500 MG PO TABS: 500.0000 mg | ORAL_TABLET | Freq: Two times a day (BID) | ORAL | Status: DC

## 2013-03-25 NOTE — Progress Notes (Signed)
[name] [birth year] [id number]        68 y.o.  G2P2002 presents in followup of her ulcerative vulvitis. Patient notes that she is much improved. Her HSV 1 PCR did return positive.  Past medical history,surgical history, problem list, medications, allergies, family history and social history were all reviewed and documented in the EPIC chart.  Exam: [name] assistant General appearance  Normal Pelvic: External exam with superficial desquamation right groin crease. No evidence of cellulitis. Remainder of the vulvar ulcerations are all resolved. Vaginal exam is normal.  Assessment/Plan:  68 y.o. G2P2002 resolving HSV 1 vulvitis.  Has a superficial ulcerative area right groin. Has been using Neosporin cream. No evidence of superinfection. Will try Silvadene cream and continue sitz baths 3 times a day. She does give an allergy to sulfa with oral antibiotics a number of years ago that caused itching but no hives or respiratory issues. She is willing to go ahead and try the Silvadene cream now. I prescribed Valtrex 500 mg twice a day x5 day prescription to have available if she has any recurrence. At this point we will wait to see what her recurrence rate is before prescribing daily suppression. I did offer this to her now and also reviewed that this may decrease transmission risk but she declines. She does have a new relationship and the only interaction has been oral which would account for her outbreak/HSV 1. Patient is due for her annual exam in May and she can follow up at that time and we'll further discuss her issues with prolapse also.   Note: This document was prepared with digital dictation and possible smart phrase technology. Any transcriptional errors that result from this process are unintentional.   Anastasio Auerbach MD, 9:31 AM 03/25/2013

## 2013-03-25 NOTE — Patient Instructions (Signed)
Try the Silvadene cream. He noticed any issues with this. Take Valtrex pill twice daily for 5 days at the earliest suspicion of a outbreak. Follow up when you're due for your annual exam.

## 2013-04-06 ENCOUNTER — Encounter: Payer: Self-pay | Admitting: Internal Medicine

## 2013-04-06 ENCOUNTER — Ambulatory Visit (INDEPENDENT_AMBULATORY_CARE_PROVIDER_SITE_OTHER): Payer: Medicare Other | Admitting: Internal Medicine

## 2013-04-06 VITALS — BP 142/80 | HR 76 | Temp 98.2°F | Wt 205.5 lb

## 2013-04-06 DIAGNOSIS — G44209 Tension-type headache, unspecified, not intractable: Secondary | ICD-10-CM | POA: Diagnosis not present

## 2013-04-06 DIAGNOSIS — M542 Cervicalgia: Secondary | ICD-10-CM | POA: Diagnosis not present

## 2013-04-06 NOTE — Patient Instructions (Signed)
Apply ice to neck and left scalp area 20 minutes twice daily. Massage therapy is okay. Call if you want prescription medications called in.

## 2013-04-06 NOTE — Progress Notes (Signed)
   Subjective:    Patient ID: [name], female    DOB: [birth year], 68 y.o.   MRN: [id number]  HPI Patient has been doing a lot of yard work recently even trying to cut up a tree that fell in her yard. Has had occipital headache for 2 weeks. No nausea and vomiting. Noticed it when it was painful to lie head on pillow. Other than yard work, cannot recall any specific activity that would've aggravated head and neck pain. Notices a pain left posterior neck. No radiculopathy symptoms in the arms. No weakness in the arms.    Review of Systems     Objective:   Physical Exam Cranial nerves II through XII grossly intact. Funduscopic exam is benign. Deep tendon reflexes upper extremities 2+ and symmetrical. Has palpable muscle spasm left sternocleidomastoid muscle. This tenderness extends into the left occipital scalp area.       Assessment & Plan:  Muscle contraction headache  Spasm sternocleidomastoid muscle on the left  Plan: Patient declines offer for pain medication and muscle relaxant. Says she will get a massage. So suggested she apply ice to left posterior neck and scalp 20 minutes twice daily.

## 2013-04-15 ENCOUNTER — Other Ambulatory Visit: Payer: Self-pay | Admitting: Internal Medicine

## 2013-05-18 ENCOUNTER — Other Ambulatory Visit: Payer: Self-pay | Admitting: Gynecology

## 2013-05-18 DIAGNOSIS — Z1231 Encounter for screening mammogram for malignant neoplasm of breast: Secondary | ICD-10-CM

## 2013-05-20 ENCOUNTER — Other Ambulatory Visit: Payer: Medicare Other | Admitting: Internal Medicine

## 2013-05-20 DIAGNOSIS — Z79899 Other long term (current) drug therapy: Secondary | ICD-10-CM | POA: Diagnosis not present

## 2013-05-20 DIAGNOSIS — Z130 Encounter for screening for diseases of the blood and blood-forming organs and certain disorders involving the immune mechanism: Secondary | ICD-10-CM | POA: Diagnosis not present

## 2013-05-20 DIAGNOSIS — R7301 Impaired fasting glucose: Secondary | ICD-10-CM

## 2013-05-20 DIAGNOSIS — Z1329 Encounter for screening for other suspected endocrine disorder: Secondary | ICD-10-CM | POA: Diagnosis not present

## 2013-05-20 DIAGNOSIS — E559 Vitamin D deficiency, unspecified: Secondary | ICD-10-CM | POA: Diagnosis not present

## 2013-05-20 DIAGNOSIS — E785 Hyperlipidemia, unspecified: Secondary | ICD-10-CM

## 2013-05-20 LAB — CBC WITH DIFFERENTIAL/PLATELET
Basophils Absolute: 0 10*3/uL (ref 0.0–0.1)
Basophils Relative: 1 % (ref 0–1)
EOS ABS: 0.2 10*3/uL (ref 0.0–0.7)
Eosinophils Relative: 5 % (ref 0–5)
HCT: 41.2 % (ref 36.0–46.0)
HEMOGLOBIN: 14 g/dL (ref 12.0–15.0)
LYMPHS ABS: 1.3 10*3/uL (ref 0.7–4.0)
Lymphocytes Relative: 29 % (ref 12–46)
MCH: 30.1 pg (ref 26.0–34.0)
MCHC: 34 g/dL (ref 30.0–36.0)
MCV: 88.6 fL (ref 78.0–100.0)
Monocytes Absolute: 0.5 10*3/uL (ref 0.1–1.0)
Monocytes Relative: 10 % (ref 3–12)
NEUTROS ABS: 2.5 10*3/uL (ref 1.7–7.7)
NEUTROS PCT: 55 % (ref 43–77)
Platelets: 202 10*3/uL (ref 150–400)
RBC: 4.65 MIL/uL (ref 3.87–5.11)
RDW: 13.8 % (ref 11.5–15.5)
WBC: 4.5 10*3/uL (ref 4.0–10.5)

## 2013-05-20 LAB — COMPREHENSIVE METABOLIC PANEL
ALT: 18 U/L (ref 0–35)
AST: 17 U/L (ref 0–37)
Albumin: 4.1 g/dL (ref 3.5–5.2)
Alkaline Phosphatase: 51 U/L (ref 39–117)
BUN: 23 mg/dL (ref 6–23)
CO2: 23 meq/L (ref 19–32)
Calcium: 8.9 mg/dL (ref 8.4–10.5)
Chloride: 107 mEq/L (ref 96–112)
Creat: 0.93 mg/dL (ref 0.50–1.10)
Glucose, Bld: 100 mg/dL — ABNORMAL HIGH (ref 70–99)
POTASSIUM: 4.4 meq/L (ref 3.5–5.3)
SODIUM: 142 meq/L (ref 135–145)
TOTAL PROTEIN: 6.3 g/dL (ref 6.0–8.3)
Total Bilirubin: 0.6 mg/dL (ref 0.2–1.2)

## 2013-05-20 LAB — LIPID PANEL
Cholesterol: 160 mg/dL (ref 0–200)
HDL: 46 mg/dL (ref >39)
LDL Cholesterol: 98 mg/dL (ref 0–99)
Total CHOL/HDL Ratio: 3.5 Ratio
Triglycerides: 79 mg/dL (ref <150)
VLDL: 16 mg/dL (ref 0–40)

## 2013-05-20 LAB — HEMOGLOBIN A1C
HEMOGLOBIN A1C: 5.7 % — AB (ref <5.7)
MEAN PLASMA GLUCOSE: 117 mg/dL — AB (ref <117)

## 2013-05-21 ENCOUNTER — Ambulatory Visit (INDEPENDENT_AMBULATORY_CARE_PROVIDER_SITE_OTHER): Payer: Medicare Other | Admitting: Internal Medicine

## 2013-05-21 ENCOUNTER — Encounter: Payer: Self-pay | Admitting: Internal Medicine

## 2013-05-21 VITALS — BP 110/62 | HR 72 | Temp 97.8°F | Ht 65.5 in | Wt 203.0 lb

## 2013-05-21 DIAGNOSIS — Z8639 Personal history of other endocrine, nutritional and metabolic disease: Secondary | ICD-10-CM

## 2013-05-21 DIAGNOSIS — R7309 Other abnormal glucose: Secondary | ICD-10-CM

## 2013-05-21 DIAGNOSIS — M1712 Unilateral primary osteoarthritis, left knee: Secondary | ICD-10-CM

## 2013-05-21 DIAGNOSIS — I10 Essential (primary) hypertension: Secondary | ICD-10-CM | POA: Diagnosis not present

## 2013-05-21 DIAGNOSIS — Z79890 Hormone replacement therapy (postmenopausal): Secondary | ICD-10-CM

## 2013-05-21 DIAGNOSIS — E669 Obesity, unspecified: Secondary | ICD-10-CM

## 2013-05-21 DIAGNOSIS — Z0000 Encounter for general adult medical examination without abnormal findings: Secondary | ICD-10-CM

## 2013-05-21 DIAGNOSIS — R7302 Impaired glucose tolerance (oral): Secondary | ICD-10-CM

## 2013-05-21 DIAGNOSIS — Z7189 Other specified counseling: Secondary | ICD-10-CM

## 2013-05-21 DIAGNOSIS — Z8679 Personal history of other diseases of the circulatory system: Secondary | ICD-10-CM

## 2013-05-21 DIAGNOSIS — R0989 Other specified symptoms and signs involving the circulatory and respiratory systems: Secondary | ICD-10-CM

## 2013-05-21 DIAGNOSIS — M1710 Unilateral primary osteoarthritis, unspecified knee: Secondary | ICD-10-CM

## 2013-05-21 DIAGNOSIS — M549 Dorsalgia, unspecified: Secondary | ICD-10-CM

## 2013-05-21 DIAGNOSIS — IMO0002 Reserved for concepts with insufficient information to code with codable children: Secondary | ICD-10-CM

## 2013-05-21 DIAGNOSIS — G8929 Other chronic pain: Secondary | ICD-10-CM

## 2013-05-21 DIAGNOSIS — G4733 Obstructive sleep apnea (adult) (pediatric): Secondary | ICD-10-CM

## 2013-05-21 LAB — POCT URINALYSIS DIPSTICK
Bilirubin, UA: NEGATIVE
Glucose, UA: NEGATIVE
Ketones, UA: NEGATIVE
Leukocytes, UA: NEGATIVE
NITRITE UA: NEGATIVE
PROTEIN UA: NEGATIVE
RBC UA: NEGATIVE
SPEC GRAV UA: 1.02
UROBILINOGEN UA: NEGATIVE
pH, UA: 6.5

## 2013-05-21 LAB — TSH: TSH: 2.845 u[IU]/mL (ref 0.350–4.500)

## 2013-05-21 LAB — VITAMIN D 25 HYDROXY (VIT D DEFICIENCY, FRACTURES): VIT D 25 HYDROXY: 53 ng/mL (ref 30–89)

## 2013-05-21 MED ORDER — CYMBALTA 60 MG PO CPEP: 60.0000 mg | ORAL_CAPSULE | Freq: Every day | ORAL | Status: DC

## 2013-05-21 MED ORDER — CYMBALTA 30 MG PO CPEP: 30.0000 mg | ORAL_CAPSULE | Freq: Every day | ORAL | Status: DC

## 2013-05-21 MED ORDER — ATENOLOL 50 MG PO TABS: 50.0000 mg | ORAL_TABLET | Freq: Every day | ORAL | Status: DC

## 2013-05-21 MED ORDER — LOSARTAN POTASSIUM 50 MG PO TABS: 50.0000 mg | ORAL_TABLET | Freq: Every day | ORAL | Status: DC

## 2013-05-21 MED ORDER — SIMVASTATIN 20 MG PO TABS: 20.0000 mg | ORAL_TABLET | Freq: Every day | ORAL | Status: DC

## 2013-05-21 NOTE — Patient Instructions (Addendum)
Take Tenormin and Cozaar 50 mg  q am. Please diet and exercise regularly. Return in 6 months for office visit, blood pressure check, hemoglobin A1c, lipid panel, liver functions. Call with blood pressure readings in a few weeks.

## 2013-05-21 NOTE — Progress Notes (Signed)
Subjective:    Patient ID: [name], female    DOB: July 22, 1945, 68 y.o.   MRN: [id number]  HPI  68 year old White Female for health maintenance and evaluation of medical issues. Has tinnitus and flavinoids have not helped. BP continues to fluctuate at times 938 systolically . Pt. thinks it is stress related.   History of trace mitral regurgitation and mitral valve prolapse diagnosed 1983. History of GE reflux. History of obstructive sleep apnea, obesity, osteoarthritis.  History of L4-L5 microdiscectomy October 2004. History of benign right breast biopsy 1997. Tonsillectomy and adenoidectomy at age 68. Bilateral tubal ligation at age 68. Right oophorectomy D&C and uterine polypectomy 2002.  She has had several knee surgeries including right knee torn meniscus February 2006, left knee arthroscopic surgery September 2010, another left knee arthroscopic surgery October 2011. Underwent total right knee replacement February 2012. Left knee replacement has been advised. Had arthroscopy left knee 2014  Zostavax vaccine April 2011, Pneumovax immunization October 2011, tetanus immunization 2008. Gets annual influenza immunization.  Sulfa causes hives  Uses CPAP for sleep apnea.  Social history: She is a widow. Husband died of brain cancer. She sees an older gentleman frequently and is sexually active. She does not smoke. Social alcohol consumption. 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 2 adult daughters one of whom is a Company secretary in the [employer]. Several grandchildren.  Family history: Parents divorced when she was 41 years old so she does not know father's family history. Mother died at age 78 of heart failure, kidney failure, scleroderma. Patient is an only child. Maternal grandmother died at age 49 of abdominal aneurysm rupture.    Review of Systems  Constitutional: Negative.   Eyes: Negative.   Respiratory:  Negative.   Cardiovascular: Negative.   Gastrointestinal: Negative.   Endocrine: Negative.   Genitourinary: Negative.   Musculoskeletal:       Left knee osteoarthritis. Left knee replacement has been advised.  Skin: Negative.   Allergic/Immunologic: Negative.   Neurological: Negative.   Hematological: Negative.   Psychiatric/Behavioral: Negative.        Objective:   Physical Exam  Vitals reviewed. Constitutional: She is oriented to person, place, and time. She appears well-developed and well-nourished. No distress.  HENT:  Head: Normocephalic and atraumatic.  Right Ear: External ear normal.  Left Ear: External ear normal.  Mouth/Throat: Oropharynx is clear and moist. No oropharyngeal exudate.  Eyes: Conjunctivae and EOM are normal. Pupils are equal, round, and reactive to light. Right eye exhibits no discharge. Left eye exhibits no discharge. No scleral icterus.  Neck: Normal range of motion. Neck supple. No JVD present. No tracheal deviation present. No thyromegaly present.  Cardiovascular: Normal rate, regular rhythm, normal heart sounds and intact distal pulses.   No murmur heard. No click or significant murmur appreciated  Pulmonary/Chest: Effort normal and breath sounds normal. No respiratory distress. She has no wheezes. She has no rales. She exhibits no tenderness.  Breasts normal female  Abdominal: Soft. Bowel sounds are normal. She exhibits no distension and no mass. There is no tenderness. There is no rebound and no guarding.  Genitourinary:  Deferred to GYN  Musculoskeletal: Normal range of motion. She exhibits no edema.  Lymphadenopathy:    She has no cervical adenopathy.  Neurological: She is alert and oriented to person, place, and time. She has normal reflexes. She displays normal reflexes.  No cranial nerve deficit. Coordination normal.  Skin: Skin is warm and dry. No rash noted. She is not diaphoretic. No erythema.  Psychiatric: She has a normal mood and affect.  Her behavior is normal. Judgment and thought content normal.          Assessment & Plan:  Remote history of mitral valve prolapse maintained on beta blocker  Osteoarthritis left knee  History of decreased libido treated with estrogen replacement successfully  Labile hypertension. Patient brings in multiple blood pressure readings sometimes as high as 542 systolically. Increased losartan to 50  mg daily and call me with blood pressure readings in the next few weeks. Continue Tenormin. She needs to take both of these medications in the morning. Has  been taking Tenormin at night.  Obesity. One point she went on paleo diet and lost a good deal of weight. Now weighs 201 pounds and has increased 2 pounds from last physical exam. Needs to be well under 200 pounds. Discussed at length diet and exercise.  History of impaired glucose tolerance-hemoglobin A1c stable  Chronic back pain treated with Cymbalta  History of vitamin D deficiency treated with over-the-counter D3  Sleep apnea-treated with CPAP  Plan: Increase losartan as described above. Return in 6 months or sooner if need be for blood pressure control. Call me with blood pressure readings in a couple of weeks.  Subjective:   Patient presents for Medicare Annual/Subsequent preventive examination.  Review Past Medical/Family/Social:   Risk Factors  Current exercise habits: Works in the yard Dietary issues discussed: Dietary options discussed including paleo, low fat and low car  Cardiac risk factors:  Depression Screen  (Note: if answer to either of the following is "Yes", a more complete depression screening is indicated)   Over the past two weeks, have you felt down, depressed or hopeless? No  Over the past two weeks, have you felt little interest or pleasure in doing things? No Have you lost interest or pleasure in daily life? No Do you often feel hopeless? No Do you cry easily over simple problems? No   Activities  of Daily Living  In your present state of health, do you have any difficulty performing the following activities?:   Driving? No  Managing money? No  Feeding yourself? No  Getting from bed to chair? No  Climbing a flight of stairs? No  Preparing food and eating?: No  Bathing or showering? No  Getting dressed: No  Getting to the toilet? No  Using the toilet:No  Moving around from place to place: No  In the past year have you fallen or had a near fall?:yes-one Are you sexually active? yes Do you have more than one partner? No   Hearing Difficulties: No  Do you often ask people to speak up or repeat themselves? yes Do you experience ringing or noises in your ears? yes  Do you have difficulty understanding soft or whispered voices? yes Do you feel that you have a problem with memory? No Do you often misplace items? No    Home Safety:  Do you have a smoke alarm at your residence? Yes Do you have grab bars in the bathroom?no Do you have throw rugs in your house?yes   Cognitive Testing  Alert? Yes Normal Appearance?Yes  Oriented to person? Yes Place? Yes  Time? Yes  Recall of three objects? Yes  Can perform simple calculations? Yes  Displays appropriate judgment?Yes  Can read the correct time from a watch face?Yes  List the Names of Other Physician/Practitioners you currently use:  See referral list for the physicians patient is currently seeing.   GYN physician,  Eye physician, orthopedist  Review of Systems: See above   Objective:     General appearance: Appears stated age and mildly obese  Head: Normocephalic, without obvious abnormality, atraumatic  Eyes: conj clear, EOMi PEERLA  Ears: normal TM's and external ear canals both ears  Nose: Nares normal. Septum midline. Mucosa normal. No drainage or sinus tenderness.  Throat: lips, mucosa, and tongue normal; teeth and gums normal  Neck: no adenopathy, no carotid bruit, no JVD, supple, symmetrical, trachea midline  and thyroid not enlarged, symmetric, no tenderness/mass/nodules  No CVA tenderness.  Lungs: clear to auscultation bilaterally  Breasts: normal appearance, no masses or tenderness Heart: regular rate and rhythm, S1, S2 normal, no murmur, click, rub or gallop  Abdomen: soft, non-tender; bowel sounds normal; no masses, no organomegaly  Musculoskeletal: ROM normal in all joints, no crepitus, no deformity, Normal muscle strengthen. Back  is symmetric, no curvature. Skin: Skin color, texture, turgor normal. No rashes or lesions  Lymph nodes: Cervical, supraclavicular, and axillary nodes normal.  Neurologic: CN 2 -12 Normal, Normal symmetric reflexes. Normal coordination and gait  Psych: Alert & Oriented x 3, Mood appear stable.    Assessment:    Annual wellness medicare exam   Plan:    During the course of the visit the patient was educated and counseled about appropriate screening and preventive services including:        Patient Instructions (the written plan) was given to the patient.  Medicare Attestation  I have personally reviewed:  The patient's medical and social history  Their use of alcohol, tobacco or illicit drugs  Their current medications and supplements  The patient's functional ability including ADLs,fall risks, home safety risks, cognitive, and hearing and visual impairment  Diet and physical activities  Evidence for depression or mood disorders  The patient's weight, height, BMI, and visual acuity have been recorded in the chart. I have made referrals, counseling, and provided education to the patient based on review of the above and I have provided the patient with a written personalized care plan for preventive services.

## 2013-06-14 ENCOUNTER — Ambulatory Visit (HOSPITAL_COMMUNITY)
Admission: RE | Admit: 2013-06-14 | Discharge: 2013-06-14 | Disposition: A | Discharge status: Home / Self Care | Payer: Medicare Other | Source: Ambulatory Visit | Attending: Gynecology | Admitting: Gynecology

## 2013-06-14 DIAGNOSIS — Z1231 Encounter for screening mammogram for malignant neoplasm of breast: Secondary | ICD-10-CM | POA: Insufficient documentation

## 2013-06-17 ENCOUNTER — Other Ambulatory Visit (HOSPITAL_COMMUNITY)
Admission: RE | Admit: 2013-06-17 | Discharge: 2013-06-17 | Disposition: A | Discharge status: Home / Self Care | Payer: Medicare Other | Source: Ambulatory Visit | Attending: Gynecology | Admitting: Gynecology

## 2013-06-17 ENCOUNTER — Ambulatory Visit (INDEPENDENT_AMBULATORY_CARE_PROVIDER_SITE_OTHER): Payer: Medicare Other | Admitting: Gynecology

## 2013-06-17 ENCOUNTER — Encounter: Payer: Self-pay | Admitting: Gynecology

## 2013-06-17 VITALS — BP 120/76 | Ht 66.0 in | Wt 208.0 lb

## 2013-06-17 DIAGNOSIS — R32 Unspecified urinary incontinence: Secondary | ICD-10-CM | POA: Diagnosis not present

## 2013-06-17 DIAGNOSIS — N952 Postmenopausal atrophic vaginitis: Secondary | ICD-10-CM

## 2013-06-17 DIAGNOSIS — B009 Herpesviral infection, unspecified: Secondary | ICD-10-CM | POA: Diagnosis not present

## 2013-06-17 DIAGNOSIS — Z124 Encounter for screening for malignant neoplasm of cervix: Secondary | ICD-10-CM | POA: Diagnosis not present

## 2013-06-17 DIAGNOSIS — Z780 Asymptomatic menopausal state: Secondary | ICD-10-CM

## 2013-06-17 DIAGNOSIS — A609 Anogenital herpesviral infection, unspecified: Secondary | ICD-10-CM

## 2013-06-17 MED ORDER — MIRABEGRON ER 25 MG PO TB24: 25.0000 mg | ORAL_TABLET | Freq: Every day | ORAL | Status: DC

## 2013-06-17 NOTE — Patient Instructions (Addendum)
Try the samples of the bladder medication. Should he continue to monitor blood pressure. Call me in followup to let me know how you're doing with the medication.    Mirabegron extended-release tablets What is this medicine? MIRABEGRON (MIR a BEG ron) is used to treat overactive bladder. This medicine reduces the amount of bathroom visits. It may also help to control wetting accidents. This medicine may be used for other purposes; ask your health care provider or pharmacist if you have questions. COMMON BRAND NAME(S): Myrbetriq What should I tell my health care provider before I take this medicine? They need to know if you have any of these conditions: -difficulty passing urine -high blood pressure -kidney disease -liver disease -an unusual or allergic reaction to mirabegron, other medicines, foods, dyes, or preservatives -pregnant or trying to get pregnant -breast-feeding How should I use this medicine? Take this medicine by mouth with a glass of water. Follow the directions on the prescription label. Do not cut, crush or chew this medicine. You can take it with or without food. If it upsets your stomach, take it with food. Take your medicine at regular intervals. Do not take it more often than directed. Do not stop taking except on your doctor's advice. Talk to your pediatrician regarding the use of this medicine in children. Special care may be needed. Overdosage: If you think you've taken too much of this medicine contact a poison control center or emergency room at once. Overdosage: If you think you have taken too much of this medicine contact a poison control center or emergency room at once. NOTE: This medicine is only for you. Do not share this medicine with others. What if I miss a dose? If you miss a dose, take it as soon as you can. If it is almost time for your next dose, take only that dose. Do not take double or extra doses. What may interact with this medicine? -certain  medicines for bladder problems like fesoterodine, oxybutynin, solifenacin, tolterodine -desipramine -digoxin -flecainide -ketoconazole -MAOIs like Carbex, Eldepryl, Marplan, Nardil, and Parnate -metoprolol -propafenone -thioridazine -warfarin This list may not describe all possible interactions. Give your health care provider a list of all the medicines, herbs, non-prescription drugs, or dietary supplements you use. Also tell them if you smoke, drink alcohol, or use illegal drugs. Some items may interact with your medicine. What should I watch for while using this medicine? It may take 8 weeks to notice the full benefit from this medicine. You may need to limit your intake tea, coffee, caffeinated sodas, and alcohol. These drinks may make your symptoms worse. Visit your doctor or health care professional for regular checks on your progress. Check your blood pressure as directed. Ask your doctor or health care professional what your blood pressure should be and when you should contact him or her. What side effects may I notice from receiving this medicine? Side effects that you should report to your doctor or health care professional as soon as possible: -allergic reactions like skin rash, itching or hives, swelling of the face, lips, or tongue -chest pain or palpitations -severe or sudden headache -high blood pressure -fast, irregular heartbeat -redness, blistering, peeling or loosening of the skin, including inside the mouth -signs of infection - fever or chills, pain or difficulty passing urine -trouble passing urine or change in the amount of urine Side effects that usually do not require medical attention (Report these to your doctor or health care professional if they continue or are bothersome.): -  constipation -dry eyes -joint pain -mild headache -nausea -runny nose This list may not describe all possible side effects. Call your doctor for medical advice about side effects. You  may report side effects to FDA at 1-800-FDA-1088. Where should I keep my medicine? Keep out of the reach of children. Store at room temperature between 15 and 30 degrees C (59 and 86 degrees F). Throw away any unused medicine after the expiration date. NOTE: This sheet is a summary. It may not cover all possible information. If you have questions about this medicine, talk to your doctor, pharmacist, or health care provider.  2014, Elsevier/Gold Standard. (2011-09-13 15:59:47)

## 2013-06-17 NOTE — Addendum Note (Signed)
Addended by: Nelva Nay on: 06/17/2013 12:18 PM   Modules accepted: Orders

## 2013-06-17 NOTE — Progress Notes (Signed)
[name] Dec 08, 1945 [id number]        68 y.o.  G2P2002 for followup exam. Several issues noted below.   Past medical history,surgical history, problem list, medications, allergies, family history and social history were all reviewed and documented as reviewed in the EPIC chart.  ROS:  12 system ROS performed with pertinent positives and negatives included in the history, assessment and plan.  Included Systems: General, HEENT, Neck, Cardiovascular, Pulmonary, Gastrointestinal, Genitourinary, Musculoskeletal, Dermatologic, Endocrine, Hematological, Neurologic, Psychiatric Additional significant findings :  None   Exam: [name] assistant Filed Vitals:   06/17/13 1125  BP: 120/76  Height: 5\' 6"  (1.676 m)  Weight: 208 lb (94.348 kg)   General appearance:  Normal affect, orientation and appearance. Skin: Grossly normal HEENT: Without gross lesions.  No cervical or supraclavicular adenopathy. Thyroid normal.  Lungs:  Clear without wheezing, rales or rhonchi Cardiac: RR, without RMG Abdominal:  Soft, nontender, without masses, guarding, rebound, organomegaly or hernia Breasts:  Examined lying and sitting without masses, retractions, discharge or axillary adenopathy. Pelvic:  Ext/BUS/vagina with generalized atrophic changes  Cervix with atrophic changes. Pap done  Uterus axial to anteverted, normal size, shape and contour, midline and mobile nontender   Adnexa  Without masses or tenderness    Anus and perineum  Normal   Rectovaginal  Normal sphincter tone without palpated masses or tenderness.    Assessment/Plan:  68 y.o. G75P2002 female for annual exam.   1. Postmenopausal/atrophic genital changes. Patient had previously been on HRT but discontinued. She's doing well without significant hot flushes night sweats vaginal dryness or dyspareunia. No vaginal bleeding. Will continue to monitor. Call if any vaginal bleeding. 2. History of HSV outbreak. No recurrences. Will call if she has  a recurrence. 3. Pap smear 2012. Pap smear done today. No history of significant abnormal Pap smears previously. Options to stop screening altogether she is over the age of 68 versus less frequent screening intervals reviewed. We'll readdress on annual basis. 4. Mammography 06/2013. Continue with annual mammography. SBE monthly reviewed. 5. Urinary incontinence.  Mixed pattern with both stress and urge symptoms. Has been using VESIcare but does not routinely take because of dry mouth. Alternatives to include Myrbetriq reviewed. Patient wants to try. I did discuss possible hypertension exacerbation with this. She does take her blood pressure every morning he should continue to monitor and if she notices her blood pressure rise initial stop. 25 mg samples x3 weeks given. #30 with 2 refills written. Patient will call and I know how she's doing. Urinalysis today. 6. Yeast vulvovaginitis history. Patient has been using Monistat OTC to apply externally when she gets irritated which seems to be doing well. Exam today is normal and she will continue to use this intermittently as needed. 7. DEXA 2010 normal. Recommend repeat now a 5 year interval. Increase calcium vitamin D reviewed. 8. Colonoscopy 2007. Planned repeat at 10 year interval per her history. 9. Health maintenance. No routine blood work done this Dr. Renold Genta does all this for her. Followup with response to Myrbetriq otherwise annually.   Note: This document was prepared with digital dictation and possible smart phrase technology. Any transcriptional errors that result from this process are unintentional.   Anastasio Auerbach MD, 12:00 PM 06/17/2013

## 2013-06-21 LAB — CYTOLOGY - PAP

## 2013-07-08 ENCOUNTER — Other Ambulatory Visit: Payer: Self-pay | Admitting: Gynecology

## 2013-07-08 ENCOUNTER — Ambulatory Visit (INDEPENDENT_AMBULATORY_CARE_PROVIDER_SITE_OTHER): Payer: Medicare Other

## 2013-07-08 DIAGNOSIS — Z780 Asymptomatic menopausal state: Secondary | ICD-10-CM

## 2013-08-03 DIAGNOSIS — M1710 Unilateral primary osteoarthritis, unspecified knee: Secondary | ICD-10-CM | POA: Diagnosis not present

## 2013-08-03 DIAGNOSIS — M25569 Pain in unspecified knee: Secondary | ICD-10-CM | POA: Diagnosis not present

## 2013-08-09 ENCOUNTER — Telehealth: Payer: Self-pay

## 2013-08-09 ENCOUNTER — Other Ambulatory Visit: Payer: Self-pay

## 2013-08-09 MED ORDER — DULOXETINE HCL 60 MG PO CPEP: 60.0000 mg | ORAL_CAPSULE | Freq: Every day | ORAL | Status: DC

## 2013-08-09 MED ORDER — DULOXETINE HCL 30 MG PO CPEP: 30.0000 mg | ORAL_CAPSULE | Freq: Every day | ORAL | Status: DC

## 2013-08-09 NOTE — Telephone Encounter (Signed)
Patient has requested to switch to Cymbalta 30mg  generic but stay on Cymbalta 60mg  brand name. Eventually would like to switch to generic on  Both. Per Dr. Renold Genta,  She should just try both doses of generic. If it is not effective, can always switch back to name brand. Patient voices understanding.

## 2013-09-09 DIAGNOSIS — M1710 Unilateral primary osteoarthritis, unspecified knee: Secondary | ICD-10-CM | POA: Diagnosis not present

## 2013-09-13 ENCOUNTER — Telehealth: Payer: Self-pay

## 2013-09-13 NOTE — Telephone Encounter (Signed)
Patient faxes in some BP readings for the past month. Will be scanned in Epic. Most are within acceptable range, with a few higher readings. Advised to check BP once or twice a week. Advised BP is variable and doesn't run the same all the time. She has an appointment in November.

## 2013-09-17 DIAGNOSIS — M1710 Unilateral primary osteoarthritis, unspecified knee: Secondary | ICD-10-CM | POA: Diagnosis not present

## 2013-09-23 DIAGNOSIS — M1710 Unilateral primary osteoarthritis, unspecified knee: Secondary | ICD-10-CM | POA: Diagnosis not present

## 2013-11-12 ENCOUNTER — Other Ambulatory Visit: Payer: Self-pay | Admitting: Internal Medicine

## 2013-11-15 ENCOUNTER — Encounter: Payer: Self-pay | Admitting: Gynecology

## 2013-11-29 ENCOUNTER — Other Ambulatory Visit: Payer: Medicare Other | Admitting: Internal Medicine

## 2013-11-30 ENCOUNTER — Ambulatory Visit: Payer: Medicare Other | Admitting: Internal Medicine

## 2013-12-06 ENCOUNTER — Other Ambulatory Visit: Payer: Medicare Other | Admitting: Internal Medicine

## 2013-12-06 ENCOUNTER — Other Ambulatory Visit: Payer: Self-pay | Admitting: Internal Medicine

## 2013-12-06 DIAGNOSIS — R7302 Impaired glucose tolerance (oral): Secondary | ICD-10-CM | POA: Diagnosis not present

## 2013-12-06 DIAGNOSIS — I10 Essential (primary) hypertension: Secondary | ICD-10-CM | POA: Diagnosis not present

## 2013-12-06 DIAGNOSIS — R0989 Other specified symptoms and signs involving the circulatory and respiratory systems: Secondary | ICD-10-CM

## 2013-12-06 LAB — BASIC METABOLIC PANEL
BUN: 14 mg/dL (ref 6–23)
CALCIUM: 9.6 mg/dL (ref 8.4–10.5)
CO2: 21 mEq/L (ref 19–32)
Chloride: 106 mEq/L (ref 96–112)
Creat: 0.79 mg/dL (ref 0.50–1.10)
GLUCOSE: 88 mg/dL (ref 70–99)
POTASSIUM: 4.7 meq/L (ref 3.5–5.3)
Sodium: 138 mEq/L (ref 135–145)

## 2013-12-07 ENCOUNTER — Encounter: Payer: Self-pay | Admitting: Internal Medicine

## 2013-12-07 ENCOUNTER — Telehealth: Payer: Self-pay

## 2013-12-07 ENCOUNTER — Ambulatory Visit (INDEPENDENT_AMBULATORY_CARE_PROVIDER_SITE_OTHER): Payer: Medicare Other | Admitting: Internal Medicine

## 2013-12-07 VITALS — BP 128/78 | HR 60 | Temp 97.1°F | Ht 66.0 in | Wt 203.0 lb

## 2013-12-07 DIAGNOSIS — Z23 Encounter for immunization: Secondary | ICD-10-CM

## 2013-12-07 DIAGNOSIS — I10 Essential (primary) hypertension: Secondary | ICD-10-CM

## 2013-12-07 DIAGNOSIS — R7302 Impaired glucose tolerance (oral): Secondary | ICD-10-CM

## 2013-12-07 DIAGNOSIS — L304 Erythema intertrigo: Secondary | ICD-10-CM

## 2013-12-07 LAB — HEMOGLOBIN A1C
Hgb A1c MFr Bld: 5.7 % — ABNORMAL HIGH (ref <5.7)
Mean Plasma Glucose: 117 mg/dL — ABNORMAL HIGH (ref <117)

## 2013-12-07 MED ORDER — KETOCONAZOLE 2 % EX CREA: TOPICAL_CREAM | Freq: Every day | CUTANEOUS | Status: DC

## 2013-12-07 NOTE — Progress Notes (Signed)
   Subjective:    Patient ID: [name], female    DOB: Feb 04, 1945, 68 y.o.   MRN: [id number]  HPI   For 6 month recheck. Hx HTN and impaired glucose tolerance. Eating a lot of chocolate candy. Female friend having heart surgery at [facility] in December. Needs to lose weight. Having issues with intertrigo in groin particular during hot weather.    Review of Systems     Objective:   Physical Exam  Constitutional: She appears well-developed and well-nourished. No distress.  Neck: No JVD present. No thyromegaly present.  Cardiovascular: Normal rate, regular rhythm, normal heart sounds and intact distal pulses.   No murmur heard. Pulmonary/Chest: She has no wheezes. She has no rales.  Musculoskeletal: She exhibits no edema.  Lymphadenopathy:    She has no cervical adenopathy.  Skin: She is not diaphoretic.  Vitals reviewed.         Assessment & Plan:  Hypertension-stable on current regimen  Impaired glucose tolerance-check hemoglobin A1c  Obesity-encouraged diet exercise and weight loss  Intertrigo-use Nizoral cream daily as needed with refills  Plan: Flu vaccine given. Return in 6 months for physical exam.

## 2013-12-07 NOTE — Patient Instructions (Addendum)
Watch diet and exercise. Return in 6 months. Use Nizoral cream daily for intertrigo. Continue same meds. Flu vaccine given.

## 2013-12-07 NOTE — Telephone Encounter (Signed)
A1C added

## 2013-12-08 ENCOUNTER — Telehealth: Payer: Self-pay

## 2013-12-08 NOTE — Telephone Encounter (Signed)
Patient aware of A1C results 

## 2013-12-08 NOTE — Telephone Encounter (Signed)
-----   Message from Elby Showers, MD sent at 12/08/2013 11:59 AM EST ----- Please call pt. AIC is stable and excellent.

## 2014-01-02 ENCOUNTER — Other Ambulatory Visit: Payer: Self-pay | Admitting: Internal Medicine

## 2014-01-26 DIAGNOSIS — D692 Other nonthrombocytopenic purpura: Secondary | ICD-10-CM | POA: Diagnosis not present

## 2014-01-26 DIAGNOSIS — Z85828 Personal history of other malignant neoplasm of skin: Secondary | ICD-10-CM | POA: Diagnosis not present

## 2014-01-26 DIAGNOSIS — L304 Erythema intertrigo: Secondary | ICD-10-CM | POA: Diagnosis not present

## 2014-01-26 DIAGNOSIS — L573 Poikiloderma of Civatte: Secondary | ICD-10-CM | POA: Diagnosis not present

## 2014-01-27 ENCOUNTER — Other Ambulatory Visit: Payer: Self-pay | Admitting: Internal Medicine

## 2014-02-10 DIAGNOSIS — H43811 Vitreous degeneration, right eye: Secondary | ICD-10-CM | POA: Diagnosis not present

## 2014-03-28 ENCOUNTER — Telehealth: Payer: Self-pay | Admitting: Internal Medicine

## 2014-03-28 ENCOUNTER — Ambulatory Visit (INDEPENDENT_AMBULATORY_CARE_PROVIDER_SITE_OTHER): Payer: Medicare Other | Admitting: Internal Medicine

## 2014-03-28 ENCOUNTER — Encounter: Payer: Self-pay | Admitting: Internal Medicine

## 2014-03-28 VITALS — BP 118/62 | HR 63 | Temp 97.4°F | Wt 207.0 lb

## 2014-03-28 DIAGNOSIS — I10 Essential (primary) hypertension: Secondary | ICD-10-CM

## 2014-03-28 DIAGNOSIS — L259 Unspecified contact dermatitis, unspecified cause: Secondary | ICD-10-CM

## 2014-03-28 MED ORDER — PREDNISONE 10 MG PO TABS: ORAL_TABLET | ORAL | Status: DC

## 2014-03-28 NOTE — Telephone Encounter (Signed)
Needs OV.  

## 2014-03-28 NOTE — Telephone Encounter (Signed)
Spoke with patient; appointment given for today @ 3:15 p.m.

## 2014-03-28 NOTE — Telephone Encounter (Signed)
She got into some poison oak last week while planting at her daughters.  She has whelps all over her elbows and arms.  She's itching terrible.  She has tried everything at home possible that she knows to try.  States she has always been sensitive to this and it happens every year when she gets out and starts planting.  Wants to know if you'll call her something in or if she has to be seen.    Please advise.    Pharmacy:  [facility] @ [address]

## 2014-03-28 NOTE — Progress Notes (Signed)
   Subjective:    Patient ID: [name], female    DOB: 08/21/1945, 69 y.o.   MRN: [id number]  HPI Daughter has moved to [place]. Patient was down there  March 10 setting out some fruit trees and working in [employer]. Subsequently developed rash on forearms which is not responded over-the-counter cortisone cream. She did not see any poison ivy.  History of essential hypertension. Blood pressure is excellent today.    Review of Systems     Objective:   Physical Exam  She has macular erythema in the olecranon fossa bilaterally. Has some papular erythematous lesions. No vesicles. Rash involves both forearms with discrete lesions      Assessment & Plan:  Contact dermatitis  Essential hypertension  Plan: Prednisone 10 mg ( #21) going from 60 mg to 0 mg over 7 days with one refill. If not better in 7 days, have prescription refilled.

## 2014-03-28 NOTE — Patient Instructions (Signed)
Take Prednisone in tapering course as directed.

## 2014-04-02 DIAGNOSIS — L237 Allergic contact dermatitis due to plants, except food: Secondary | ICD-10-CM | POA: Diagnosis not present

## 2014-04-07 DIAGNOSIS — L239 Allergic contact dermatitis, unspecified cause: Secondary | ICD-10-CM | POA: Diagnosis not present

## 2014-04-07 DIAGNOSIS — Z85828 Personal history of other malignant neoplasm of skin: Secondary | ICD-10-CM | POA: Diagnosis not present

## 2014-05-03 ENCOUNTER — Other Ambulatory Visit: Payer: Self-pay | Admitting: Internal Medicine

## 2014-05-12 ENCOUNTER — Other Ambulatory Visit: Payer: Self-pay | Admitting: Gynecology

## 2014-05-12 DIAGNOSIS — Z1231 Encounter for screening mammogram for malignant neoplasm of breast: Secondary | ICD-10-CM

## 2014-05-16 ENCOUNTER — Other Ambulatory Visit: Payer: Self-pay | Admitting: Internal Medicine

## 2014-05-25 DIAGNOSIS — M1712 Unilateral primary osteoarthritis, left knee: Secondary | ICD-10-CM | POA: Diagnosis not present

## 2014-06-04 DIAGNOSIS — M1712 Unilateral primary osteoarthritis, left knee: Secondary | ICD-10-CM | POA: Diagnosis not present

## 2014-06-09 ENCOUNTER — Telehealth: Payer: Self-pay | Admitting: Internal Medicine

## 2014-06-09 ENCOUNTER — Encounter: Payer: Self-pay | Admitting: Internal Medicine

## 2014-06-09 ENCOUNTER — Ambulatory Visit (INDEPENDENT_AMBULATORY_CARE_PROVIDER_SITE_OTHER): Payer: Medicare Other | Admitting: Internal Medicine

## 2014-06-09 VITALS — BP 128/64 | HR 62 | Temp 97.2°F | Wt 202.0 lb

## 2014-06-09 DIAGNOSIS — M5417 Radiculopathy, lumbosacral region: Secondary | ICD-10-CM

## 2014-06-09 DIAGNOSIS — M79605 Pain in left leg: Secondary | ICD-10-CM | POA: Diagnosis not present

## 2014-06-09 MED ORDER — HYDROCODONE-ACETAMINOPHEN 5-325 MG PO TABS: 1.0000 | ORAL_TABLET | Freq: Four times a day (QID) | ORAL | Status: DC | PRN

## 2014-06-09 MED ORDER — PREDNISONE 10 MG PO TABS: ORAL_TABLET | ORAL | Status: DC

## 2014-06-09 NOTE — Telephone Encounter (Signed)
Will see today.  

## 2014-06-09 NOTE — Patient Instructions (Signed)
Take Prednisone and Hydrocodone APAP as directed. See neurologist if no better.

## 2014-06-09 NOTE — Telephone Encounter (Signed)
Patient called with C/O back pain (S1 area) that she says she's had for years.  States she has been working in the yard and feels she has probably inflamed that area.  She is not able to lie in the bed and rest at all.  She is having to sleep in the recliner - that's the only place she can get ANY rest or find comfort at all.  She takes Cymbalta 60mg  and 30mg  for this and states that Dr. Erling Cruz had told her a few years ago that she could go to 120mg  if she needed to on the Cymbalta.    Patient has been seeing Dr. Tonita Cong.  She had an MRI in his office this week and has not heard results yet.  I have called over and requested notes/results from her visits.  Her knee was also injected there.  She was a patient of Dr. Tressia Danas (who has now retired).  She called to make an appointment with Dr. Rexene [facility]) and they advised because it has been more than 3 years since she has been seen, she will have to have a referral from you to be seen there.    Have given patient an appointment with you for 6/6 at 2:45.  (she will be out of town leaving Sat. 5/28 and returning 6/4).  She wants to know if she can increase the Cymbalta to 120mg  and see if that makes a difference between now and when she comes in to see you on 6/6?

## 2014-06-12 ENCOUNTER — Encounter: Payer: Self-pay | Admitting: Internal Medicine

## 2014-06-12 NOTE — Progress Notes (Signed)
   Subjective:    Patient ID: [name], female    DOB: [birth year], 69 y.o.   MRN: [id number]  HPI  Patient [place] beach tomorrow for [place] weekend . She's been having some leg pain and thinks she may have a radiculopathy. She has a history of knee pain and osteoarthritis of knees. Had arthroscopy by Dr. Tonita Cong left knee January 2014. She had an MRI of the back in 2009 showing facet arthropathy on the right and showed previous decompressive surgery on the left at L4-L5 without evidence of neural compression in 2009. She had mild disc bulge and facet degeneration L5-S1. EMG in 2005 was mildly abnormal showing mild denervation changes in the low lumbosacral paraspinals from previous surgery with superimposed nerve root irritation S1 but no frank radiculopathy on either side. She was seeing Dr. Erling Cruz at the time. Left L4-L5 microdiscectomy was done in 2004 by Dr. Sherwood Gambler.  History of right knee torn meniscus repaired 2006. In 2011 she was receiving cortisone injections in right knee with only temporary relief. In 2012 she had right knee arthroscopy with partial meniscectomy and debridement. In 2014 she had partial left lateral meniscectomy with debridement of the femoral condyle.  Review of Systems     Objective:   Physical Exam Straight leg raising is negative at 90 bilaterally muscle strength is normal in the lower extremities. She is complaining of left lateral leg pain is very severe and not responding to her usual medications. Cannot rest well at night.       Assessment & Plan:  Left leg pain-possible radiculopathy  History of left knee osteoarthritis status post arthroscopic surgery 2014 for Dr. Tonita Cong.  History of right knee arthroscopic surgeries 2  History of mild denervation changes in lumbosacral paraspinals with S1 irritation on left  Plan: Sterapred DS 10 mg 6 day dosepak. Norco 5/325 one by mouth every 8 hours when necessary pain

## 2014-06-16 ENCOUNTER — Ambulatory Visit (HOSPITAL_COMMUNITY): Payer: BLUE CROSS/BLUE SHIELD

## 2014-06-20 ENCOUNTER — Encounter: Payer: Self-pay | Admitting: Internal Medicine

## 2014-06-20 ENCOUNTER — Ambulatory Visit (INDEPENDENT_AMBULATORY_CARE_PROVIDER_SITE_OTHER): Payer: Medicare Other | Admitting: Internal Medicine

## 2014-06-20 ENCOUNTER — Ambulatory Visit: Payer: Self-pay | Admitting: Internal Medicine

## 2014-06-20 VITALS — BP 124/70 | HR 72 | Temp 99.8°F | Wt 200.0 lb

## 2014-06-20 DIAGNOSIS — J069 Acute upper respiratory infection, unspecified: Secondary | ICD-10-CM | POA: Diagnosis not present

## 2014-06-20 DIAGNOSIS — J9801 Acute bronchospasm: Secondary | ICD-10-CM | POA: Diagnosis not present

## 2014-06-20 MED ORDER — ALBUTEROL SULFATE HFA 108 (90 BASE) MCG/ACT IN AERS: 2.0000 | INHALATION_SPRAY | Freq: Four times a day (QID) | RESPIRATORY_TRACT | Status: DC | PRN

## 2014-06-20 MED ORDER — CEFTRIAXONE SODIUM 1 G IJ SOLR
1.0000 g | INTRAMUSCULAR | Status: DC
  Administered 2014-06-20: 1 g via INTRAMUSCULAR

## 2014-06-20 MED ORDER — LEVOFLOXACIN 500 MG PO TABS: 500.0000 mg | ORAL_TABLET | Freq: Every day | ORAL | Status: DC

## 2014-06-20 MED ORDER — HYDROCODONE-HOMATROPINE 5-1.5 MG/5ML PO SYRP: 5.0000 mL | ORAL_SOLUTION | Freq: Three times a day (TID) | ORAL | Status: DC | PRN

## 2014-06-20 NOTE — Patient Instructions (Signed)
Rocephin 1 g IM given. Levaquin 500 milligrams daily for 10 days. Albuterol inhaler 2 sprays by mouth 4 times daily. Hycodan 1 teaspoon by mouth every 8 hours when necessary cough. Have chest x-ray tomorrow.

## 2014-06-20 NOTE — Progress Notes (Signed)
   Subjective:    Patient ID: [name], female    DOB: 03-28-1945, 69 y.o.   MRN: [id number]  HPI Martin Majestic to the beach for [place] weekend. Still having issues with pain in left leg that she thinks is a radiculopathy/S1 nerve root impingement. Wants to see neurologist and we will make an appointment for that. Came down with acute respiratory infection while at the beach. Has discolored sputum production. No known fever or shaking chills. However has malaise and fatigue. A lot of coughing.    Review of Systems     Objective:   Physical Exam  TMs are full bilaterally but not red. Pharynx slightly injected without exudate. Neck is supple without adenopathy. Chest: Bilateral rhonchi but no wheezing. Rhonchi do not necessarily clear with cough        Assessment & Plan:  Acute bronchitis  Acute bronchospasm  Plan: Rocephin 1 g IM given in the office today. Levaquin 500 milligrams daily for 10 days. Have chest x-ray tomorrow. Albuterol inhaler 2 sprays by mouth 4 times daily. Hycodan 1 teaspoon by mouth every 8 hours when necessary cough.

## 2014-06-21 ENCOUNTER — Telehealth: Payer: Self-pay | Admitting: *Deleted

## 2014-06-21 ENCOUNTER — Ambulatory Visit
Admission: RE | Admit: 2014-06-21 | Discharge: 2014-06-21 | Disposition: A | Discharge status: Home / Self Care | Payer: Medicare Other | Source: Ambulatory Visit | Attending: Internal Medicine | Admitting: Internal Medicine

## 2014-06-21 DIAGNOSIS — J069 Acute upper respiratory infection, unspecified: Secondary | ICD-10-CM

## 2014-06-21 DIAGNOSIS — R05 Cough: Secondary | ICD-10-CM | POA: Diagnosis not present

## 2014-06-21 NOTE — Telephone Encounter (Signed)
Reviewed chest xray results with patient.

## 2014-06-22 ENCOUNTER — Ambulatory Visit (HOSPITAL_COMMUNITY)
Admission: RE | Admit: 2014-06-22 | Discharge: 2014-06-22 | Disposition: A | Discharge status: Home / Self Care | Payer: Medicare Other | Source: Ambulatory Visit | Attending: Gynecology | Admitting: Gynecology

## 2014-06-22 DIAGNOSIS — Z1231 Encounter for screening mammogram for malignant neoplasm of breast: Secondary | ICD-10-CM | POA: Insufficient documentation

## 2014-07-04 ENCOUNTER — Encounter: Payer: Self-pay | Admitting: Internal Medicine

## 2014-07-04 ENCOUNTER — Ambulatory Visit (INDEPENDENT_AMBULATORY_CARE_PROVIDER_SITE_OTHER): Payer: Medicare Other | Admitting: Internal Medicine

## 2014-07-04 VITALS — BP 128/72 | HR 88 | Temp 98.0°F | Wt 201.0 lb

## 2014-07-04 DIAGNOSIS — J45901 Unspecified asthma with (acute) exacerbation: Secondary | ICD-10-CM | POA: Diagnosis not present

## 2014-07-04 MED ORDER — FLUTICASONE-SALMETEROL 250-50 MCG/DOSE IN AEPB: 1.0000 | INHALATION_SPRAY | Freq: Two times a day (BID) | RESPIRATORY_TRACT | Status: DC

## 2014-07-04 MED ORDER — PREDNISONE 10 MG PO TABS: ORAL_TABLET | ORAL | Status: DC

## 2014-07-04 MED ORDER — HYDROCODONE-HOMATROPINE 5-1.5 MG/5ML PO SYRP: 5.0000 mL | ORAL_SOLUTION | Freq: Three times a day (TID) | ORAL | Status: DC | PRN

## 2014-07-04 MED ORDER — CLARITHROMYCIN 500 MG PO TABS: 500.0000 mg | ORAL_TABLET | Freq: Two times a day (BID) | ORAL | Status: DC

## 2014-07-04 NOTE — Progress Notes (Signed)
   Subjective:    Patient ID: [name], female    DOB: 1945-03-23, 69 y.o.   MRN: [id number]  HPI Still having issues with coughing and congestion. Feels that she has head congestion as well as chest congestion. Is out of cough syrup. Not much sputum production. No fever or shaking chills. Recent chest x-ray was negative for pneumonia. Has been back to the beach last week. 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 Hasn't had much time to rest. Became ill during the [facility] Day weekend.  Still having issues with S1 radiculopathy left leg. Appointment/referral made to [facility] neurology but they have not called her with an appointment yet    Review of Systems     Objective:   Physical Exam  Skin warm and dry. Nodes none. TMs and pharynx are clear. Neck is supple. Chest bilateral rhonchi noted particularly in right lateral chest. No wheezing.      Assessment & Plan:  Asthmatic bronchitis  S1 radiculopathy  Plan: Sterapred DS 10 mg 12 day dosepak take as directed. Change Levaquin to Biaxin 500 mg twice daily for 10 days. Refill Hycodan. Advair 250/50 1 spray by mouth every 12 hours when necessary cough. Continue albuterol inhaler 4 times daily. If symptoms do not improve, refer to Pulmonary.

## 2014-07-04 NOTE — Patient Instructions (Signed)
Referral has been made to [facility] neurology regarding leg pain. Take Advair as directed and albuterol as directed. Change Levaquin to Biaxin for 10 days. Take prednisone in tapering course for 12 days.

## 2014-07-05 ENCOUNTER — Encounter: Payer: Medicare Other | Admitting: Gynecology

## 2014-07-06 DIAGNOSIS — M1712 Unilateral primary osteoarthritis, left knee: Secondary | ICD-10-CM | POA: Diagnosis not present

## 2014-07-13 DIAGNOSIS — M1712 Unilateral primary osteoarthritis, left knee: Secondary | ICD-10-CM | POA: Diagnosis not present

## 2014-07-16 ENCOUNTER — Other Ambulatory Visit: Payer: Self-pay | Admitting: Internal Medicine

## 2014-07-22 DIAGNOSIS — M1712 Unilateral primary osteoarthritis, left knee: Secondary | ICD-10-CM | POA: Diagnosis not present

## 2014-07-25 ENCOUNTER — Encounter: Payer: Self-pay | Admitting: Gynecology

## 2014-07-25 ENCOUNTER — Ambulatory Visit (INDEPENDENT_AMBULATORY_CARE_PROVIDER_SITE_OTHER): Payer: Medicare Other | Admitting: Gynecology

## 2014-07-25 VITALS — BP 120/76 | Ht 65.0 in | Wt 193.0 lb

## 2014-07-25 DIAGNOSIS — Z01419 Encounter for gynecological examination (general) (routine) without abnormal findings: Secondary | ICD-10-CM

## 2014-07-25 DIAGNOSIS — R32 Unspecified urinary incontinence: Secondary | ICD-10-CM | POA: Diagnosis not present

## 2014-07-25 DIAGNOSIS — N952 Postmenopausal atrophic vaginitis: Secondary | ICD-10-CM

## 2014-07-25 NOTE — Patient Instructions (Signed)
You may obtain a copy of any labs that were done today by logging onto MyChart as outlined in the instructions provided with your AVS (after visit summary). The office will not call with normal lab results but certainly if there are any significant abnormalities then we will contact you.   Health Maintenance, Female A healthy lifestyle and preventative care can promote health and wellness.  Maintain regular health, dental, and eye exams.  Eat a healthy diet. Foods like vegetables, fruits, whole grains, low-fat dairy products, and lean protein foods contain the nutrients you need without too many calories. Decrease your intake of foods high in solid fats, added sugars, and salt. Get information about a proper diet from your caregiver, if necessary.  Regular physical exercise is one of the most important things you can do for your health. Most adults should get at least 150 minutes of moderate-intensity exercise (any activity that increases your heart rate and causes you to sweat) each week. In addition, most adults need muscle-strengthening exercises on 2 or more days a week.   Maintain a healthy weight. The body mass index (BMI) is a screening tool to identify possible weight problems. It provides an estimate of body fat based on height and weight. Your caregiver can help determine your BMI, and can help you achieve or maintain a healthy weight. For adults 20 years and older:  A BMI below 18.5 is considered underweight.  A BMI of 18.5 to 24.9 is normal.  A BMI of 25 to 29.9 is considered overweight.  A BMI of 30 and above is considered obese.  Maintain normal blood lipids and cholesterol by exercising and minimizing your intake of saturated fat. Eat a balanced diet with plenty of fruits and vegetables. Blood tests for lipids and cholesterol should begin at age 61 and be repeated every 5 years. If your lipid or cholesterol levels are high, you are over 50, or you are a high risk for heart  disease, you may need your cholesterol levels checked more frequently.Ongoing high lipid and cholesterol levels should be treated with medicines if diet and exercise are not effective.  If you smoke, find out from your caregiver how to quit. If you do not use tobacco, do not start.  Lung cancer screening is recommended for adults aged 33 80 years who are at high risk for developing lung cancer because of a history of smoking. Yearly low-dose computed tomography (CT) is recommended for people who have at least a 30-pack-year history of smoking and are a current smoker or have quit within the past 15 years. A pack year of smoking is smoking an average of 1 pack of cigarettes a day for 1 year (for example: 1 pack a day for 30 years or 2 packs a day for 15 years). Yearly screening should continue until the smoker has stopped smoking for at least 15 years. Yearly screening should also be stopped for people who develop a health problem that would prevent them from having lung cancer treatment.  If you are pregnant, do not drink alcohol. If you are breastfeeding, be very cautious about drinking alcohol. If you are not pregnant and choose to drink alcohol, do not exceed 1 drink per day. One drink is considered to be 12 ounces (355 mL) of beer, 5 ounces (148 mL) of wine, or 1.5 ounces (44 mL) of liquor.  Avoid use of street drugs. Do not share needles with anyone. Ask for help if you need support or instructions about stopping  the use of drugs.  High blood pressure causes heart disease and increases the risk of stroke. Blood pressure should be checked at least every 1 to 2 years. Ongoing high blood pressure should be treated with medicines, if weight loss and exercise are not effective.  If you are 59 to 70 years old, ask your caregiver if you should take aspirin to prevent strokes.  Diabetes screening involves taking a blood sample to check your fasting blood sugar level. This should be done once every 3  years, after age 91, if you are within normal weight and without risk factors for diabetes. Testing should be considered at a younger age or be carried out more frequently if you are overweight and have at least 1 risk factor for diabetes.  Breast cancer screening is essential preventative care for women. You should practice "breast self-awareness." This means understanding the normal appearance and feel of your breasts and may include breast self-examination. Any changes detected, no matter how small, should be reported to a caregiver. Women in their 66s and 30s should have a clinical breast exam (CBE) by a caregiver as part of a regular health exam every 1 to 3 years. After age 101, women should have a CBE every year. Starting at age 100, women should consider having a mammogram (breast X-ray) every year. Women who have a family history of breast cancer should talk to their caregiver about genetic screening. Women at a high risk of breast cancer should talk to their caregiver about having an MRI and a mammogram every year.  Breast cancer gene (BRCA)-related cancer risk assessment is recommended for women who have family members with BRCA-related cancers. BRCA-related cancers include breast, ovarian, tubal, and peritoneal cancers. Having family members with these cancers may be associated with an increased risk for harmful changes (mutations) in the breast cancer genes BRCA1 and BRCA2. Results of the assessment will determine the need for genetic counseling and BRCA1 and BRCA2 testing.  The Pap test is a screening test for cervical cancer. Women should have a Pap test starting at age 57. Between ages 25 and 35, Pap tests should be repeated every 2 years. Beginning at age 37, you should have a Pap test every 3 years as long as the past 3 Pap tests have been normal. If you had a hysterectomy for a problem that was not cancer or a condition that could lead to cancer, then you no longer need Pap tests. If you are  between ages 50 and 76, and you have had normal Pap tests going back 10 years, you no longer need Pap tests. If you have had past treatment for cervical cancer or a condition that could lead to cancer, you need Pap tests and screening for cancer for at least 20 years after your treatment. If Pap tests have been discontinued, risk factors (such as a new sexual partner) need to be reassessed to determine if screening should be resumed. Some women have medical problems that increase the chance of getting cervical cancer. In these cases, your caregiver may recommend more frequent screening and Pap tests.  The human papillomavirus (HPV) test is an additional test that may be used for cervical cancer screening. The HPV test looks for the virus that can cause the cell changes on the cervix. The cells collected during the Pap test can be tested for HPV. The HPV test could be used to screen women aged 44 years and older, and should be used in women of any age  who have unclear Pap test results. After the age of 55, women should have HPV testing at the same frequency as a Pap test.  Colorectal cancer can be detected and often prevented. Most routine colorectal cancer screening begins at the age of 44 and continues through age 20. However, your caregiver may recommend screening at an earlier age if you have risk factors for colon cancer. On a yearly basis, your caregiver may provide home test kits to check for hidden blood in the stool. Use of a small camera at the end of a tube, to directly examine the colon (sigmoidoscopy or colonoscopy), can detect the earliest forms of colorectal cancer. Talk to your caregiver about this at age 86, when routine screening begins. Direct examination of the colon should be repeated every 5 to 10 years through age 13, unless early forms of pre-cancerous polyps or small growths are found.  Hepatitis C blood testing is recommended for all people born from [birth year] through 1965 and any  individual with known risks for hepatitis C.  Practice safe sex. Use condoms and avoid high-risk sexual practices to reduce the spread of sexually transmitted infections (STIs). Sexually active women aged 36 and younger should be checked for Chlamydia, which is a common sexually transmitted infection. Older women with new or multiple partners should also be tested for Chlamydia. Testing for other STIs is recommended if you are sexually active and at increased risk.  Osteoporosis is a disease in which the bones lose minerals and strength with aging. This can result in serious bone fractures. The risk of osteoporosis can be identified using a bone density scan. Women ages 20 and over and women at risk for fractures or osteoporosis should discuss screening with their caregivers. Ask your caregiver whether you should be taking a calcium supplement or vitamin D to reduce the rate of osteoporosis.  Menopause can be associated with physical symptoms and risks. Hormone replacement therapy is available to decrease symptoms and risks. You should talk to your caregiver about whether hormone replacement therapy is right for you.  Use sunscreen. Apply sunscreen liberally and repeatedly throughout the day. You should seek shade when your shadow is shorter than you. Protect yourself by wearing long sleeves, pants, a wide-brimmed hat, and sunglasses year round, whenever you are outdoors.  Notify your caregiver of new moles or changes in moles, especially if there is a change in shape or color. Also notify your caregiver if a mole is larger than the size of a pencil eraser.  Stay current with your immunizations. Document Released: 07/16/2010 Document Revised: 04/27/2012 Document Reviewed: 07/16/2010 [facility] Patient Information 2014 [employer].

## 2014-07-25 NOTE — Progress Notes (Signed)
DARRIELLE PFLIEGER May 24, 1945 [id number]        69 y.o.  G2P2002 for breast and pelvic exam. Several issues noted below.  Past medical history,surgical history, problem list, medications, allergies, family history and social history were all reviewed and documented as reviewed in the EPIC chart.  ROS:  Performed with pertinent positives and negatives included in the history, assessment and plan.   Additional significant findings :  none   Exam: [name] Counsellor Vitals:   07/25/14 1459  BP: 120/76  Height: 5\' 5"  (1.651 m)  Weight: 193 lb (87.544 kg)   General appearance:  Normal affect, orientation and appearance. Skin: Grossly normal HEENT: Without gross lesions.  No cervical or supraclavicular adenopathy. Thyroid normal.  Lungs:  Clear without wheezing, rales or rhonchi Cardiac: RR, without RMG Abdominal:  Soft, nontender, without masses, guarding, rebound, organomegaly or hernia Breasts:  Examined lying and sitting without masses, retractions, discharge or axillary adenopathy. Pelvic:  Ext/BUS/vagina with atrophic changes  Cervix with atrophic changes  Uterus grossly normal size midline mobile nontender   Adnexa  Without gross masses or tenderness    Anus and perineum  Normal   Rectovaginal  Normal sphincter tone without palpated masses or tenderness.    Assessment/Plan:  69 y.o. [id number] female for breast and pelvic exam.   1. Postmenopausal/atrophic genital changes. Patient without significant symptoms of hot flushes, night sweats, vaginal dryness or any vaginal bleeding. Continue to monitor and report any issues. 2. History of urinary incontinence with a mixed stress/urgency symptoms. Has tried OAB medications in the past but did not like the side effects. Options for urologic referral and treatment discussed. Patient not interested and is comfortable with following expectantly. Check urinalysis today. 3. Pap smear 2015. No Pap smear done today. No history of significant  abnormal Pap smears. Options to stop screening altogether she is over the age of 69 versus less frequent screening intervals reviewed. Will readdress on annual basis. 4. DEXA 2015 normal. Plan repeat at 5 year interval. Increased calcium vitamin D reviewed. 5. Colonoscopy 2007. Planned repeat next year a 10 year interval. 6. History of HSV. No recent outbreaks. Will call if becomes an issue. 7. Health maintenance. No routine blood work done as this is done at Dr. Verlene Mayer office. Follow up in one year, sooner as needed.   Anastasio Auerbach MD, 3:27 PM 07/25/2014

## 2014-07-26 LAB — URINALYSIS W MICROSCOPIC + REFLEX CULTURE
BACTERIA UA: NONE SEEN
Bilirubin Urine: NEGATIVE
Casts: NONE SEEN
Crystals: NONE SEEN
Glucose, UA: NEGATIVE mg/dL
Hgb urine dipstick: NEGATIVE
Ketones, ur: NEGATIVE mg/dL
Nitrite: NEGATIVE
PH: 7 (ref 5.0–8.0)
Protein, ur: NEGATIVE mg/dL
SPECIFIC GRAVITY, URINE: 1.025 (ref 1.005–1.030)
Urobilinogen, UA: 0.2 mg/dL (ref 0.0–1.0)

## 2014-07-27 LAB — URINE CULTURE
Colony Count: NO GROWTH
Organism ID, Bacteria: NO GROWTH

## 2014-08-05 ENCOUNTER — Ambulatory Visit (INDEPENDENT_AMBULATORY_CARE_PROVIDER_SITE_OTHER): Payer: Medicare Other | Admitting: Neurology

## 2014-08-05 ENCOUNTER — Encounter: Payer: Self-pay | Admitting: Neurology

## 2014-08-05 VITALS — BP 129/70 | HR 49 | Resp 16 | Ht 65.0 in | Wt 191.0 lb

## 2014-08-05 DIAGNOSIS — F40240 Claustrophobia: Secondary | ICD-10-CM | POA: Diagnosis not present

## 2014-08-05 DIAGNOSIS — M5440 Lumbago with sciatica, unspecified side: Secondary | ICD-10-CM

## 2014-08-05 DIAGNOSIS — M79605 Pain in left leg: Secondary | ICD-10-CM | POA: Diagnosis not present

## 2014-08-05 MED ORDER — ALPRAZOLAM 0.5 MG PO TABS: ORAL_TABLET | ORAL | Status: DC

## 2014-08-05 NOTE — Progress Notes (Signed)
Subjective:    Patient ID: LEGACY CARRENDER is a 69 y.o. female.  HPI     Paula Age, MD, PhD [facility] [address], [address] [place], [place] [address]  Dear Dr. Renold Genta,   I saw your patient, [name], upon your kind request in my neurologic clinic today for initial consultation of her radiating back pain. The patient is unaccompanied today. As you know, [name] is a 69 year old right-handed woman with an underlying medical history of asthmatic bronchitis, obesity, osteoarthritis, status post arthroscopic knee surgery on the left in January 2014 under Dr. Tonita Cong, and prior arthroscopic knee surgeries on the right , degenerative lumbar spine disease, status post lumbar spine surgery at L4-L5 in 2009 under Dr. Sherwood Gambler, with evidence of degenerative disc disease in the lower spine and prior abnormal EMG and nerve conduction test in 2005 in keeping with mild denervation in the lumbosacral paraspinal muscles, who reports an approximately two-month history of radiating back pain to the left. Her pain is primarily in the left lateral thigh, radiating to the ankle or midfoot area. Sometimes she has tingling in the left big toe. Of note, she stays physically very active. She lives alone. She has 2 grown daughters. 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 Previously she had epidural steroidal injections which she felt were not helpful. In the past she had seen Dr. Erling Cruz in our office and was started on Cymbalta which she feels has been helpful. She is currently taking 90 mg daily. Sometimes she rubs the Voltaren gel on the areas that are sore and this helps as well.   She had a lumbar spine MRI with and without contrast on 04/28/2007: L3-4:  Disc bulge.  Facet arthropathy on the right that could be symptomatic.  See above discussion. L4-5:   Previous decompressive surgery on the left.  No apparent neural compression at this level. L5-S1:  Mild disc bulge and facet degeneration.  No stenosis. In addition, I personally reviewed the images through the PACS system.  For symptomatic treatment he recently prescribed a 6 day oral steroidal Dosepak as well as Norco 5-325 mg 3 times a day when necessary. She was then treated for asthmatic bronchitis with Biaxin and a longer course of starts which helped her leg pain and back pain as well.  Her Past Medical History Is Significant For: Past Medical History  Diagnosis Date  . Mitral valve prolapse     since [name] 3  . Overactive bladder   . Ringing in ears 2010    interfers with clarity of hearing at times  . Sleep apnea     mild -never tolerated cpap  . Stress incontinence in female     wears a pad  . Genital HSV 03/2013    Positive HSV 1 genital PCR  . Arthritis   . Hypertension     Her Past Surgical History Is Significant For: Past Surgical History  Procedure Laterality Date  . Foot surgery  2013    CORRECTION OF "HAMMER TOES" -LEFT FOOT  . Hand surgery      RIGHT THUMB BONE SPUR  . Knee arthroscopy  2006, 2010, 2012    LEFT KNEE IN  2010/ RIGHT KNEE IN 2006 AND 2012  . Breast surgery      BENIGN RIGHT BR MASS  . Tubal ligation    .  Oophorectomy      RIGHT OVARY REMOVED  . Total knee arthroplasty  2012    Right  . Knee arthroscopy  01/24/2012    Procedure: ARTHROSCOPY KNEE;  Surgeon: Johnn Hai, MD;  Location: [facility];  Service: Orthopedics;  Laterality: Left;  left knee arthroscopy with debridment, partial lateral menisectomy  . Back surgery      Her Family History Is Significant For: Family History  Problem Relation [name] of Onset  . Hypertension Mother   . Heart disease Mother   . Kidney failure Mother   . Other Mother     sclaraderma  . Diabetes Maternal Grandmother     TYPE 2  . Hypertension Maternal Grandmother   . Other Maternal  Grandmother     abdominal anersym  . Heart attack Father     Her Social History Is Significant For: History   Social History  . Marital Status: Widowed    Spouse Name: N/A  . Number of Children: 2  . Years of Education: Clg, RN   Social History Main Topics  . Smoking status: Never Smoker   . Smokeless tobacco: Never Used  . Alcohol Use: 0.0 oz/week    0 Standard drinks or equivalent per week     Comment: wine socially  . Drug Use: No  . Sexual Activity: Not Currently    Birth Control/ Protection: Post-menopausal     Comment: 1st intercourse 49 yo-2 partners   Other Topics Concern  . None   Social History Narrative   Drinks about 2 cups of coffee a day, 1 glass of tea a day     Her Allergies Are:  Allergies  Allergen Reactions  . Sulfa Antibiotics Hives  . Codeine Itching  . Morphine     REACTION: itch  . Sulfonamide Derivatives     REACTION: itch  :   Her Current Medications Are:  Outpatient Encounter Prescriptions as of 08/05/2014  Medication Sig  . atenolol (TENORMIN) 50 MG tablet TAKE 1 TABLET (50 MG TOTAL) BY MOUTH DAILY.  . Calcium-Magnesium-Vitamin D [id number] MG-MG-UNIT TB24 Take by mouth.    . clobetasol cream (TEMOVATE) 0.05 %   . co-enzyme Q-10 50 MG capsule Take 50 mg by mouth 2 (two) times daily.    Marland Kitchen desoximetasone (TOPICORT) 0.25 % cream   . DULoxetine (CYMBALTA) 30 MG capsule TAKE 1 CAPSULE (30 MG TOTAL) BY MOUTH DAILY.  . DULoxetine (CYMBALTA) 30 MG capsule TAKE 1 CAPSULE (30 MG TOTAL) BY MOUTH DAILY.  . DULoxetine (CYMBALTA) 60 MG capsule TAKE 1 CAPSULE (60 MG TOTAL) BY MOUTH DAILY.  Marland Kitchen Flaxseed, Linseed, (EQL FLAX SEED OIL) 1000 MG CAPS Take by mouth.  . fluticasone (CUTIVATE) 0.005 % ointment   . ketoconazole (NIZORAL) 2 % cream Apply topically daily.  Marland Kitchen losartan (COZAAR) 50 MG tablet TAKE 1 TABLET BY MOUTH EVERY DAY  . mirabegron ER (MYRBETRIQ) 25 MG TB24 tablet Take 1 tablet (25 mg total) by mouth daily.  . simvastatin (ZOCOR) 20 MG  tablet TAKE 1 TABLET EVERY DAY  . solifenacin (VESICARE) 5 MG tablet Take 10 mg by mouth daily.  Marland Kitchen triamcinolone ointment (KENALOG) 0.1 %   . TURMERIC PO Take by mouth.    . valACYclovir (VALTREX) 500 MG tablet Take 1 tablet (500 mg total) by mouth 2 (two) times daily.  . VOLTAREN 1 % GEL   . [DISCONTINUED] albuterol (PROVENTIL HFA;VENTOLIN HFA) 108 (90 BASE) MCG/ACT inhaler Inhale 2 puffs into the lungs every  6 (six) hours as needed for wheezing or shortness of breath. (Patient not taking: Reported on 07/25/2014)  . [DISCONTINUED] Fluticasone-Salmeterol (ADVAIR DISKUS) 250-50 MCG/DOSE AEPB Inhale 1 puff into the lungs 2 (two) times daily.   No facility-administered encounter medications on file as of 08/05/2014.  :   Review of Systems:  Out of a complete 14 point review of systems, all are reviewed and negative with the exception of these symptoms as listed below:   Review of Systems  HENT: Positive for tinnitus.   Cardiovascular:       Murmur  Neurological: Positive for weakness.       Snoring, restless legs, H/O S1 damage     Objective:  Neurologic Exam  Physical Exam Physical Examination:   Filed Vitals:   08/05/14 1036  BP: 129/70  Pulse: 49  Resp: 16   General Examination: The patient is a very pleasant 69 y.o. female in no acute distress. She appears well-developed and well-nourished and well groomed.   HEENT: Normocephalic, atraumatic, pupils are equal, round and reactive to light and accommodation. Funduscopic exam is normal with sharp disc margins noted. Extraocular tracking is good without limitation to gaze excursion or nystagmus noted. Normal smooth pursuit is noted. Hearing is grossly intact. Tympanic membranes are clear bilaterally. Face is symmetric with normal facial animation and normal facial sensation. Speech is clear with no dysarthria noted. There is no hypophonia. There is no lip, neck/head, jaw or voice tremor. Neck is supple with full range of passive and  active motion. There are no carotid bruits on auscultation. Oropharynx exam reveals: mild mouth dryness, adequate dental hygiene.  Chest: Clear to auscultation without wheezing, rhonchi or crackles noted.  Heart: S1+S2+0, regular and normal without murmurs, rubs or gallops noted.   Abdomen: Soft, non-tender and non-distended with normal bowel sounds appreciated on auscultation.  Extremities: There is no pitting edema in the distal lower extremities bilaterally. Pedal pulses are intact. She has an unremarkable scar on the right knee from the total knee replacement and scars on the left knee from arthroscopic surgery. She has a slight genu valgus on the left.  Skin: Warm and dry without trophic changes noted. There are no varicose veins.  Musculoskeletal: exam reveals no obvious joint deformities, tenderness or joint swelling or erythema.   Neurologically:  Mental status: The patient is awake, alert and oriented in all 4 spheres. Her immediate and remote memory, attention, language skills and fund of knowledge are appropriate. There is no evidence of aphasia, agnosia, apraxia or anomia. Speech is clear with normal prosody and enunciation. Thought process is linear. Mood is normal and affect is normal.  Cranial nerves II - XII are as described above under HEENT exam. In addition: shoulder shrug is normal with equal shoulder height noted. Motor exam: Normal bulk, strength and tone is noted. There is no drift, tremor or rebound. Romberg is negative. Reflexes are 2+ in the upper extremities and 1+ in the lower extremities. She has preserved ankle reflexes. She has no foot drop. Babinski: Toes are flexor bilaterally. Fine motor skills and coordination: intact with normal finger taps, normal hand movements, normal rapid alternating patting, normal foot taps and normal foot agility.  Cerebellar testing: No dysmetria or intention tremor on finger to nose testing. Heel to shin is unremarkable bilaterally.  There is no truncal or gait ataxia.  Sensory exam: intact to light touch, pinprick, vibration, temperature sense in the upper and lower extremities, with the exception of mild decrease in vibration  sense in the distal lower extremities symmetrically bilaterally.  Gait, station and balance: She stands with mild difficulty. No veering to one side is noted. No leaning to one side is noted. Posture is [name]-appropriate and stance is narrow based. Gait shows a slight limp on the left. She also reports mild pain in the right gluteal area.   Assessment and Plan:   In summary, [name] is a very pleasant 68 y.o.-year old female with an underlying medical history of asthmatic bronchitis, obesity, osteoarthritis, status post arthroscopic knee surgery on the left in January 2014 under Dr. Tonita Cong, and prior arthroscopic knee surgeries on the right , degenerative lumbar spine disease, status post lumbar spine surgery at L4-L5 in 2009 under Dr. Sherwood Gambler, with evidence of degenerative disc disease in the lower spine and prior abnormal EMG and nerve conduction test in 2005 in keeping with mild denervation in the lumbosacral paraspinal muscles, who reports an approximately two-month history of radiating back pain to the left.   On examination, she has a fairly benign exam with preserved reflexes and preserved muscle strength. She has actually improved recently after the course of oral steroids. She also uses Voltaren gel as needed which seems to help. I did not suggest any new medications today. I suggested we can recheck with lumbar spine imaging testing in the form of MRI without contrast as well as a EMG nerve conduction velocity test to the lower extremities. She is requesting something to calm her down as she is claustrophobic. I will prescribe Xanax for her MRI. We will call her with her test results. She can follow-up with me on an as-needed basis. If there are significant findings and the lower back, she may  benefit from seeing her spine specialist again. I answered all her questions today and she was in agreement. Thank you very much for allowing me to participate in the care of this nice patient. If I can be of any further assistance to you please do not hesitate to call me at [phone].  Sincerely,   Paula Age, MD, PhD

## 2014-08-05 NOTE — Patient Instructions (Signed)
We will do a lumbar spine MRI and EMG/nerve conduction velocity test.   We will call you with the results. I will see you back as needed.

## 2014-08-15 ENCOUNTER — Ambulatory Visit (INDEPENDENT_AMBULATORY_CARE_PROVIDER_SITE_OTHER): Payer: Medicare Other | Admitting: Neurology

## 2014-08-15 ENCOUNTER — Ambulatory Visit (INDEPENDENT_AMBULATORY_CARE_PROVIDER_SITE_OTHER): Payer: Self-pay | Admitting: Neurology

## 2014-08-15 ENCOUNTER — Encounter: Payer: Self-pay | Admitting: Neurology

## 2014-08-15 DIAGNOSIS — M79605 Pain in left leg: Secondary | ICD-10-CM | POA: Diagnosis not present

## 2014-08-15 DIAGNOSIS — M1712 Unilateral primary osteoarthritis, left knee: Secondary | ICD-10-CM

## 2014-08-15 DIAGNOSIS — F40240 Claustrophobia: Secondary | ICD-10-CM

## 2014-08-15 DIAGNOSIS — M179 Osteoarthritis of knee, unspecified: Secondary | ICD-10-CM

## 2014-08-15 DIAGNOSIS — M5440 Lumbago with sciatica, unspecified side: Secondary | ICD-10-CM

## 2014-08-15 NOTE — Progress Notes (Signed)
Please refer to EMG and nerve conduction study procedure note. 

## 2014-08-15 NOTE — Procedures (Signed)
     HISTORY:  [name] is a 70 year old patient with a history of prior lumbosacral spine surgery at the L4-5 level. She has fallen in May 2016, the patient now has some discomfort at begins at the left knee and goes down into the foot. She denies any significant back pain at this time. She is being evaluated for possible neuropathy or a lumbosacral radiculopathy.   NERVE CONDUCTION STUDIES:  Nerve conduction studies were performed on both lower extremities. The distal motor latencies and motor amplitudes for the peroneal and posterior tibial nerves were within normal limits. The nerve conduction velocities for these nerves were also normal. The H reflex latencies were absent bilaterally. The sensory latencies for the peroneal nerves were within normal limits.   EMG STUDIES:  EMG study was performed on the left lower extremity:  The tibialis anterior muscle reveals 2 to 5K motor units with slightly decreased recruitment. No fibrillations or positive waves were seen. The peroneus tertius muscle reveals 2 to 6K motor units with decreased recruitment. No fibrillations or positive waves were seen. The medial gastrocnemius muscle reveals 1 to 3K motor units with full recruitment. No fibrillations or positive waves were seen. The vastus lateralis muscle reveals 2 to 4K motor units with full recruitment. No fibrillations or positive waves were seen. The iliopsoas muscle reveals 2 to 4K motor units with full recruitment. No fibrillations or positive waves were seen. The biceps femoris muscle (long head) reveals 2 to 4K motor units with full recruitment. No fibrillations or positive waves were seen. The lumbosacral paraspinal muscles were tested at 3 levels, and revealed no abnormalities of insertional activity at the upper and lower levels tested. Occasional runs of positive waves were noted at the mid-level. There was good relaxation.   IMPRESSION:  Nerve conduction studies done on both  lower extremities showed no clear evidence of a neuropathy. EMG evaluation of the left lower extremity shows chronic stable findings mainly in the left peroneal nerve distribution, this possibly may represent a chronic stable L5 radiculopathy. No evidence of a peroneal neuropathy is noted by nerve conduction studies.  Jill Alexanders MD 08/15/2014 11:03 AM  [facility] [address] [place], [place] [address]  Phone [phone] Fax [phone]

## 2014-08-16 ENCOUNTER — Telehealth: Payer: Self-pay

## 2014-08-16 NOTE — Telephone Encounter (Signed)
-----   Message from [name], MD sent at 08/16/2014  1:16 PM EDT ----- Please call patient regarding her recent EMG and NCV test results: the electrical nerve and muscle test we we performed in the lower extremities thankfully did not show findings concerning for a widespread nerve damage which we would call neuropathy. There is evidence on the muscle testing of the left lower extremity of chronic and stable findings representing pinched nerve coming from the back. Again, findings appear to be stable, however, if she has significant low back pain or radiating pain to the left leg, it may be worthwhile seeing her spine specialist again, as we discussed during our appointment.  Thanks,  [name], MD, PhD

## 2014-08-16 NOTE — Telephone Encounter (Signed)
I spoke to patient and she is aware of results. MRI is next week.

## 2014-08-16 NOTE — Progress Notes (Signed)
Quick Note:  Please call patient regarding her recent EMG and NCV test results: the electrical nerve and muscle test we we performed in the lower extremities thankfully did not show findings concerning for a widespread nerve damage which we would call neuropathy. There is evidence on the muscle testing of the left lower extremity of chronic and stable findings representing pinched nerve coming from the back. Again, findings appear to be stable, however, if she has significant low back pain or radiating pain to the left leg, it may be worthwhile seeing her spine specialist again, as we discussed during our appointment.  Thanks,  [name], MD, PhD   ______

## 2014-08-23 ENCOUNTER — Ambulatory Visit
Admission: RE | Admit: 2014-08-23 | Discharge: 2014-08-23 | Disposition: A | Discharge status: Home / Self Care | Payer: Medicare Other | Source: Ambulatory Visit | Attending: Neurology | Admitting: Neurology

## 2014-08-23 DIAGNOSIS — Z87828 Personal history of other (healed) physical injury and trauma: Secondary | ICD-10-CM | POA: Diagnosis not present

## 2014-08-23 DIAGNOSIS — M5440 Lumbago with sciatica, unspecified side: Secondary | ICD-10-CM

## 2014-08-23 DIAGNOSIS — F40240 Claustrophobia: Secondary | ICD-10-CM

## 2014-08-23 DIAGNOSIS — M79605 Pain in left leg: Secondary | ICD-10-CM

## 2014-08-23 DIAGNOSIS — M5127 Other intervertebral disc displacement, lumbosacral region: Secondary | ICD-10-CM | POA: Diagnosis not present

## 2014-08-23 DIAGNOSIS — M47817 Spondylosis without myelopathy or radiculopathy, lumbosacral region: Secondary | ICD-10-CM | POA: Diagnosis not present

## 2014-08-26 NOTE — Progress Notes (Signed)
Quick Note:  Please call patient back regarding her recent lumbar spine MRI results: Findings are in keeping with postsurgical changes from her prior back surgery but she also has some nerve impingement on the left at L5 level and on the right at a level higher than this which is L4. While I do not see anything that would forward surgical intervention, she may want to touch base with her spine surgeon as well as he knows her imaging results better. Overall, there are milder degenerative changes throughout her lumbar spine, but more prominent changes at L3-4 and L4-5.  Paula Age, MD, PhD [facility] ([facility])  ______

## 2014-08-29 ENCOUNTER — Telehealth: Payer: Self-pay

## 2014-08-29 NOTE — Telephone Encounter (Signed)
I spoke to patient and she is aware of results and recommendation. She states that she will not go back to her surgeon, she did not like him. Patient will touch base with PCP and I will send report to PCP.

## 2014-08-29 NOTE — Telephone Encounter (Signed)
-----   Message from [name], MD sent at 08/26/2014  1:07 PM EDT ----- Please call patient back regarding her recent lumbar spine MRI results: Findings are in keeping with postsurgical changes from her prior back surgery but she also has some nerve impingement on the left at L5 level and on the right at a level higher than this which is L4. While I do not see anything that would forward surgical intervention, she may want to touch base with her spine surgeon as well as he knows her imaging results better. Overall, there are milder degenerative changes throughout her lumbar spine, but more prominent changes at L3-4 and L4-5.  [name], MD, PhD [facility])

## 2014-09-01 ENCOUNTER — Other Ambulatory Visit: Payer: Self-pay | Admitting: Internal Medicine

## 2014-10-25 DIAGNOSIS — H5203 Hypermetropia, bilateral: Secondary | ICD-10-CM | POA: Diagnosis not present

## 2014-10-25 DIAGNOSIS — H2513 Age-related nuclear cataract, bilateral: Secondary | ICD-10-CM | POA: Diagnosis not present

## 2014-11-10 ENCOUNTER — Ambulatory Visit (INDEPENDENT_AMBULATORY_CARE_PROVIDER_SITE_OTHER): Payer: Medicare Other | Admitting: Internal Medicine

## 2014-11-10 ENCOUNTER — Encounter: Payer: Self-pay | Admitting: Internal Medicine

## 2014-11-10 VITALS — HR 57 | Temp 97.9°F | Wt 182.0 lb

## 2014-11-10 DIAGNOSIS — Z23 Encounter for immunization: Secondary | ICD-10-CM | POA: Diagnosis not present

## 2014-11-21 ENCOUNTER — Other Ambulatory Visit: Payer: Medicare Other | Admitting: Internal Medicine

## 2014-11-25 ENCOUNTER — Encounter: Payer: Medicare Other | Admitting: Internal Medicine

## 2014-12-06 ENCOUNTER — Ambulatory Visit (INDEPENDENT_AMBULATORY_CARE_PROVIDER_SITE_OTHER): Payer: Medicare Other | Admitting: Internal Medicine

## 2014-12-06 VITALS — BP 116/68 | HR 74 | Temp 100.1°F | Resp 18 | Ht 65.0 in | Wt 182.0 lb

## 2014-12-06 DIAGNOSIS — J029 Acute pharyngitis, unspecified: Secondary | ICD-10-CM | POA: Diagnosis not present

## 2014-12-06 DIAGNOSIS — J0100 Acute maxillary sinusitis, unspecified: Secondary | ICD-10-CM

## 2014-12-06 LAB — POCT RAPID STREP A (OFFICE): RAPID STREP A SCREEN: NEGATIVE

## 2014-12-06 MED ORDER — AMOXICILLIN 875 MG PO TABS: 875.0000 mg | ORAL_TABLET | Freq: Two times a day (BID) | ORAL | Status: DC

## 2014-12-06 NOTE — Progress Notes (Signed)
Subjective:  This chart was scribed for Tami Lin, MD by Moises Blood, Medical Scribe. This patient was seen in Room 11 and the patient's care was started at 8:00 PM   Patient ID: [name], female    DOB: 23-May-1945, 69 y.o.   MRN: [id number] Chief Complaint  Patient presents with  . Sore Throat    x2 days   . Sinusitis    HPI [name] is a 69 y.o. female who presents to [facility] complaining of sore throat with sinus pressure that started 2 days ago. She's been working in the leaves a lot. She had trouble sleeping last night. She plans on being around family for the holidays.   Patient Active Problem List   Diagnosis Date Noted  . Left knee DJD 01/24/2012  . Decreased libido 11/05/2011  . Osteoarthritis of left knee 11/11/2010  . Impaired glucose tolerance 11/11/2010  . Vitamin D deficiency 11/11/2010  . Obesity 07/14/2010  . Mitral valve prolapse 07/14/2010  . HYPERLIPIDEMIA 10/01/2007  . OBSTRUCTIVE SLEEP APNEA 10/01/2007    Current outpatient prescriptions:  .  atenolol (TENORMIN) 50 MG tablet, TAKE 1 TABLET (50 MG TOTAL) BY MOUTH DAILY., Disp: 90 tablet, Rfl: 1 .  Calcium-Magnesium-Vitamin D 600-40-500 MG-MG-UNIT TB24, Take by mouth.  , Disp: , Rfl:  .  clobetasol cream (TEMOVATE) 0.05 %, , Disp: , Rfl:  .  co-enzyme Q-10 50 MG capsule, Take 50 mg by mouth 2 (two) times daily.  , Disp: , Rfl:  .  desoximetasone (TOPICORT) 0.25 % cream, , Disp: , Rfl:  .  DULoxetine (CYMBALTA) 30 MG capsule, TAKE 1 CAPSULE (30 MG TOTAL) BY MOUTH DAILY., Disp: 30 capsule, Rfl: 3 .  DULoxetine (CYMBALTA) 60 MG capsule, TAKE 1 CAPSULE (60 MG TOTAL) BY MOUTH DAILY., Disp: 30 capsule, Rfl: 3 .  Flaxseed, Linseed, (EQL FLAX SEED OIL) 1000 MG CAPS, Take by mouth., Disp: , Rfl:  .  fluticasone (CUTIVATE) 0.005 % ointment, , Disp: , Rfl:  .  ketoconazole (NIZORAL) 2 % cream, Apply topically daily., Disp: 60 g, Rfl: 5 .  losartan (COZAAR) 50 MG tablet, TAKE 1 TABLET BY MOUTH  EVERY DAY, Disp: 30 tablet, Rfl: 5 .  simvastatin (ZOCOR) 20 MG tablet, TAKE 1 TABLET EVERY DAY, Disp: 90 tablet, Rfl: 1 .  triamcinolone ointment (KENALOG) 0.1 %, , Disp: , Rfl:  .  TURMERIC PO, Take by mouth.  , Disp: , Rfl:  .  VOLTAREN 1 % GEL, , Disp: , Rfl:  .  ALPRAZolam (XANAX) 0.5 MG tablet, Take 1-2 pills on call to MRI, may take a third pill if needed. (Patient not taking: Reported on 12/06/2014), Disp: 3 tablet, Rfl: 0 .  mirabegron ER (MYRBETRIQ) 25 MG TB24 tablet, Take 1 tablet (25 mg total) by mouth daily. (Patient not taking: Reported on 12/06/2014), Disp: 30 tablet, Rfl: 1 .  solifenacin (VESICARE) 5 MG tablet, Take 10 mg by mouth daily., Disp: , Rfl:  .  valACYclovir (VALTREX) 500 MG tablet, Take 1 tablet (500 mg total) by mouth 2 (two) times daily. (Patient not taking: Reported on 12/06/2014), Disp: 10 tablet, Rfl: 2    Review of Systems  Constitutional: Negative for fatigue and unexpected weight change.  HENT: Positive for sinus pressure, sore throat and voice change.   Respiratory: Positive for cough. Negative for chest tightness and shortness of breath.   Cardiovascular: Negative for chest pain, palpitations and leg swelling.  Gastrointestinal: Negative for abdominal pain and blood in  stool.  Neurological: Negative for dizziness, syncope, light-headedness and headaches.       Objective:   Physical Exam  Constitutional: She is oriented to person, place, and time. She appears well-developed and well-nourished. No distress.  HENT:  Head: Normocephalic and atraumatic.  Right Ear: External ear normal.  Left Ear: External ear normal.  Boggy turbinates and purulent discharge draining into back of throat; throat red, no nodes, ears nl  Eyes: Conjunctivae and EOM are normal. Pupils are equal, round, and reactive to light.  Neck: Neck supple.  Cardiovascular: Normal rate.   Pulmonary/Chest: Effort normal and breath sounds normal. No respiratory distress.    Musculoskeletal: Normal range of motion.  Neurological: She is alert and oriented to person, place, and time.  Skin: Skin is warm and dry.  Psychiatric: She has a normal mood and affect. Her behavior is normal.  Nursing note and vitals reviewed.   BP 116/68 mmHg  Pulse 74  Temp(Src) 100.1 F (37.8 C) (Oral)  Resp 18  Ht 5\' 5"  (1.651 m)  Wt 182 lb (82.555 kg)  BMI 30.29 kg/m2  SpO2 96%  Results for orders placed or performed in visit on 12/06/14  POCT rapid strep A  Result Value Ref Range   Rapid Strep A Screen Negative Negative        Assessment & Plan:  Acute pharyngitis, unspecified etiology - Plan: POCT rapid strep A, Culture, Group A Strep  Acute maxillary sinusitis, recurrence not specified  Meds ordered this encounter  Medications  . amoxicillin (AMOXIL) 875 MG tablet    Sig: Take 1 tablet (875 mg total) by mouth 2 (two) times daily.    Dispense:  20 tablet    Refill:  0     By signing my name below, I, Moises Blood, attest that this documentation has been prepared under the direction and in the presence of Tami Lin, MD. Electronically Signed: Moises Blood, [place]. 12/06/2014 , 8:02 PM .  I have completed the patient encounter in its entirety as documented by the scribe, with editing by me where necessary. Javaya [place] P. Laney Pastor, M.D.

## 2014-12-08 LAB — CULTURE, GROUP A STREP: Organism ID, Bacteria: NORMAL

## 2014-12-26 ENCOUNTER — Other Ambulatory Visit: Payer: Medicare Other | Admitting: Internal Medicine

## 2014-12-26 DIAGNOSIS — E6609 Other obesity due to excess calories: Secondary | ICD-10-CM | POA: Diagnosis not present

## 2014-12-26 DIAGNOSIS — Z0000 Encounter for general adult medical examination without abnormal findings: Secondary | ICD-10-CM | POA: Diagnosis not present

## 2014-12-26 DIAGNOSIS — E785 Hyperlipidemia, unspecified: Secondary | ICD-10-CM | POA: Diagnosis not present

## 2014-12-26 DIAGNOSIS — I10 Essential (primary) hypertension: Secondary | ICD-10-CM

## 2014-12-26 DIAGNOSIS — E669 Obesity, unspecified: Secondary | ICD-10-CM | POA: Diagnosis not present

## 2014-12-26 DIAGNOSIS — Z79899 Other long term (current) drug therapy: Secondary | ICD-10-CM

## 2014-12-26 DIAGNOSIS — R7302 Impaired glucose tolerance (oral): Secondary | ICD-10-CM | POA: Diagnosis not present

## 2014-12-26 DIAGNOSIS — IMO0002 Reserved for concepts with insufficient information to code with codable children: Secondary | ICD-10-CM

## 2014-12-26 DIAGNOSIS — Z8639 Personal history of other endocrine, nutritional and metabolic disease: Secondary | ICD-10-CM | POA: Diagnosis not present

## 2014-12-26 DIAGNOSIS — R7309 Other abnormal glucose: Secondary | ICD-10-CM

## 2014-12-26 DIAGNOSIS — E559 Vitamin D deficiency, unspecified: Secondary | ICD-10-CM | POA: Diagnosis not present

## 2014-12-26 LAB — CBC WITH DIFFERENTIAL/PLATELET
BASOS ABS: 0 10*3/uL (ref 0.0–0.1)
Basophils Relative: 1 % (ref 0–1)
EOS ABS: 0.2 10*3/uL (ref 0.0–0.7)
EOS PCT: 5 % (ref 0–5)
HEMATOCRIT: 41.6 % (ref 36.0–46.0)
Hemoglobin: 13.5 g/dL (ref 12.0–15.0)
LYMPHS PCT: 21 % (ref 12–46)
Lymphs Abs: 1 10*3/uL (ref 0.7–4.0)
MCH: 29.3 pg (ref 26.0–34.0)
MCHC: 32.5 g/dL (ref 30.0–36.0)
MCV: 90.4 fL (ref 78.0–100.0)
MPV: 9.9 fL (ref 8.6–12.4)
Monocytes Absolute: 0.5 10*3/uL (ref 0.1–1.0)
Monocytes Relative: 10 % (ref 3–12)
Neutro Abs: 3.1 10*3/uL (ref 1.7–7.7)
Neutrophils Relative %: 63 % (ref 43–77)
PLATELETS: 210 10*3/uL (ref 150–400)
RBC: 4.6 MIL/uL (ref 3.87–5.11)
RDW: 13.2 % (ref 11.5–15.5)
WBC: 4.9 10*3/uL (ref 4.0–10.5)

## 2014-12-26 LAB — COMPLETE METABOLIC PANEL WITH GFR
ALT: 17 U/L (ref 6–29)
AST: 19 U/L (ref 10–35)
Albumin: 3.9 g/dL (ref 3.6–5.1)
Alkaline Phosphatase: 59 U/L (ref 33–130)
BUN: 22 mg/dL (ref 7–25)
CHLORIDE: 107 mmol/L (ref 98–110)
CO2: 25 mmol/L (ref 20–31)
Calcium: 9.1 mg/dL (ref 8.6–10.4)
Creat: 0.77 mg/dL (ref 0.60–0.93)
GFR, EST NON AFRICAN AMERICAN: 78 mL/min (ref >=60)
GLUCOSE: 88 mg/dL (ref 65–99)
POTASSIUM: 4.3 mmol/L (ref 3.5–5.3)
Sodium: 141 mmol/L (ref 135–146)
Total Bilirubin: 0.5 mg/dL (ref 0.2–1.2)
Total Protein: 6.4 g/dL (ref 6.1–8.1)

## 2014-12-26 LAB — LIPID PANEL
CHOL/HDL RATIO: 3.1 ratio (ref <=5.0)
CHOLESTEROL: 152 mg/dL (ref 125–200)
HDL: 49 mg/dL (ref >=46)
LDL Cholesterol: 92 mg/dL (ref <130)
TRIGLYCERIDES: 54 mg/dL (ref <150)
VLDL: 11 mg/dL (ref <30)

## 2014-12-26 LAB — TSH: TSH: 2.23 u[IU]/mL (ref 0.350–4.500)

## 2014-12-27 ENCOUNTER — Other Ambulatory Visit: Payer: Self-pay | Admitting: Internal Medicine

## 2014-12-27 LAB — HEMOGLOBIN A1C
Hgb A1c MFr Bld: 5.5 % (ref <5.7)
MEAN PLASMA GLUCOSE: 111 mg/dL (ref <117)

## 2014-12-27 LAB — VITAMIN D 25 HYDROXY (VIT D DEFICIENCY, FRACTURES): Vit D, 25-Hydroxy: 48 ng/mL (ref 30–100)

## 2014-12-29 ENCOUNTER — Other Ambulatory Visit: Payer: Self-pay | Admitting: Internal Medicine

## 2014-12-30 ENCOUNTER — Encounter: Payer: Self-pay | Admitting: Internal Medicine

## 2014-12-30 ENCOUNTER — Ambulatory Visit (INDEPENDENT_AMBULATORY_CARE_PROVIDER_SITE_OTHER): Payer: Medicare Other | Admitting: Internal Medicine

## 2014-12-30 ENCOUNTER — Other Ambulatory Visit: Payer: Self-pay | Admitting: Internal Medicine

## 2014-12-30 VITALS — BP 132/78 | HR 58 | Temp 97.9°F | Resp 20 | Ht 65.0 in | Wt 177.5 lb

## 2014-12-30 DIAGNOSIS — I10 Essential (primary) hypertension: Secondary | ICD-10-CM | POA: Diagnosis not present

## 2014-12-30 DIAGNOSIS — R7302 Impaired glucose tolerance (oral): Secondary | ICD-10-CM | POA: Diagnosis not present

## 2014-12-30 DIAGNOSIS — Z79890 Hormone replacement therapy (postmenopausal): Secondary | ICD-10-CM | POA: Diagnosis not present

## 2014-12-30 DIAGNOSIS — Z0000 Encounter for general adult medical examination without abnormal findings: Secondary | ICD-10-CM | POA: Diagnosis not present

## 2014-12-30 DIAGNOSIS — M549 Dorsalgia, unspecified: Secondary | ICD-10-CM

## 2014-12-30 DIAGNOSIS — Z23 Encounter for immunization: Secondary | ICD-10-CM | POA: Diagnosis not present

## 2014-12-30 DIAGNOSIS — Z7189 Other specified counseling: Secondary | ICD-10-CM

## 2014-12-30 DIAGNOSIS — I341 Nonrheumatic mitral (valve) prolapse: Secondary | ICD-10-CM | POA: Diagnosis not present

## 2014-12-30 DIAGNOSIS — E669 Obesity, unspecified: Secondary | ICD-10-CM

## 2014-12-30 DIAGNOSIS — M1712 Unilateral primary osteoarthritis, left knee: Secondary | ICD-10-CM | POA: Diagnosis not present

## 2014-12-30 DIAGNOSIS — E8881 Metabolic syndrome: Secondary | ICD-10-CM | POA: Diagnosis not present

## 2014-12-30 DIAGNOSIS — E785 Hyperlipidemia, unspecified: Secondary | ICD-10-CM | POA: Diagnosis not present

## 2014-12-30 DIAGNOSIS — G8929 Other chronic pain: Secondary | ICD-10-CM

## 2015-01-03 ENCOUNTER — Other Ambulatory Visit: Payer: Self-pay

## 2015-01-04 ENCOUNTER — Telehealth: Payer: Self-pay

## 2015-01-04 MED ORDER — METRONIDAZOLE 500 MG PO TABS: 500.0000 mg | ORAL_TABLET | Freq: Two times a day (BID) | ORAL | Status: DC

## 2015-01-04 NOTE — Telephone Encounter (Signed)
Call in Flagyl 500 mg twice daily x 7 days. No alcohol while taking it.

## 2015-01-04 NOTE — Telephone Encounter (Signed)
Patient contacted office and states that she is still having diarrhea without fever and without nausea. She states that you and her had discussed flagyl. She states that she would like to try it to see if it will help as her children have requested her not come for Christmas with diarrhea and she is due to be out of town all next week and leaving Saturday.

## 2015-01-04 NOTE — Telephone Encounter (Signed)
Prescription sent electronically to pharmacy. Patient notified. 

## 2015-01-14 ENCOUNTER — Encounter: Payer: Self-pay | Admitting: Internal Medicine

## 2015-01-14 NOTE — Patient Instructions (Signed)
It was a pleasure to see you today. Continue same medications. Return in 6 months. Watch diet and exercise. Try to lose weight.

## 2015-01-14 NOTE — Progress Notes (Signed)
Subjective:    Patient ID: [name], female    DOB: 1945/07/10, 69 y.o.   MRN: [id number]  HPI 69 year old Female in today for health maintenance exam and evaluation of medical issues. His been on a cruise recently with her boyfriend and brings in many pictures. She has a history of hypertension well-controlled on current regimen. History of mitral valve prolapse and has been on beta blocker for many years. History of vitamin D deficiency. History of sleep apnea, hyperlipidemia, impaired glucose tolerance. Trace mitral regurgitation and mitral valve prolapse diagnosed in 1983. History of GE reflux. History of obesity and osteoarthritis.  History of L4-L5 microdiscectomy October 2004. Benign right breast biopsy 1997. Tonsillectomy and adenoidectomy at age 89. Bilateral tubal ligation at age 87. Right oophorectomy D&C and uterine polypectomy 2002.  She has had several knee surgeries including right knee torn meniscus February 2006, left knee arthroscopic surgery September 2010, another left knee arthroscopic surgery October 2011. Underwent total right knee replacement February 2012. Left knee replacement has been advised. Had arthroscopy left knee 2014.  Zostavax vaccine April 2011. Pneumovax 05 November 2009. Tetanus immunization 2008. Gets annual flu vaccine. Needs Prevnar.  Sulfa causes hives.   Uses C Pap for sleep apnea.  Social history: She is a widow. Husband died of brain cancer. She sees an older gentleman frequently and is sexually active. They enjoy traveling together and other activities. Social alcohol consumption. She does not smoke. 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 2 adult daughters, one of whom is a Company secretary and the other daughter teaches music. Several grandchildren.  Family history: Parents divorced when she was 7 years also she does not know father's family history. Mother died at age 52 of heart failure,  kidney failure, scleroderma. Patient is an only child. Maternal grandmother died at age 71 of abdominal aneurysm rupture.  History of impaired glucose tolerance treated with diet. History of vitamin D deficiency treated with over-the-counter D3. Chronic back pain treated with Cymbalta. History of decreased libido treated with estrogen replacement.  Review of Systems musculoskeletal pain left knee and back     Objective:   Physical Exam  Constitutional: She is oriented to person, place, and time. She appears well-developed and well-nourished. No distress.  HENT:  Head: Normocephalic and atraumatic.  Right Ear: External ear normal.  Left Ear: External ear normal.  Mouth/Throat: Oropharynx is clear and moist. No oropharyngeal exudate.  Eyes: Conjunctivae and EOM are normal. Pupils are equal, round, and reactive to light. Right eye exhibits no discharge. Left eye exhibits no discharge. No scleral icterus.  Neck: Neck supple. No JVD present. No thyromegaly present.  Cardiovascular: Normal rate, regular rhythm, normal heart sounds and intact distal pulses.   No murmur heard. Pulmonary/Chest: Effort normal and breath sounds normal. No respiratory distress. She has no wheezes. She has no rales.  Breasts normal female  Abdominal: Soft. Bowel sounds are normal. She exhibits no distension and no mass. There is no tenderness. There is no rebound and no guarding.  Genitourinary:  Deferred  Musculoskeletal: She exhibits no edema.  Left knee osteoarthritis  Lymphadenopathy:    She has no cervical adenopathy.  Neurological: She is alert and oriented to person, place, and time. She has normal reflexes. Coordination normal.  Skin: Skin is warm and dry. No rash noted. She is not diaphoretic.  Psychiatric: She has a normal mood and affect. Her behavior is normal. Judgment  and thought content normal.  Vitals reviewed.         Assessment & Plan:  Essential hypertension  Hyperlipidemia  History  of vitamin D deficiency  Chronic back pain treated with Cymbalta  Impaired glucose tolerance  Mitral valve prolapse  Obesity  History of sleep apnea  Left knee osteoarthritis  Plan: Return in 6 months for follow-up.  Subjective:   Patient presents for Medicare Annual/Subsequent preventive examination.  Review Past Medical/Family/Social: See above   Risk Factors  Current exercise habits: Works in yard Dietary issues discussed: Low fat low carbohydrate advised  Cardiac risk factors: Hyperlipidemia, and impaired glucose tolerance, obesity  Depression Screen  (Note: if answer to either of the following is "Yes", a more complete depression screening is indicated)   Over the past two weeks, have you felt down, depressed or hopeless? No  Over the past two weeks, have you felt little interest or pleasure in doing things? No Have you lost interest or pleasure in daily life? No Do you often feel hopeless? No Do you cry easily over simple problems? No   Activities of Daily Living  In your present state of health, do you have any difficulty performing the following activities?:   Driving? No  Managing money? No  Feeding yourself? No  Getting from bed to chair? No  Climbing a flight of stairs? No  Preparing food and eating?: No  Bathing or showering? No  Getting dressed: No  Getting to the toilet? No  Using the toilet:No  Moving around from place to place: No  In the past year have you fallen or had a near fall?: Tripped on steps. No injury Are you sexually active? Yes Do you have more than one partner? No   Hearing Difficulties: No  Do you often ask people to speak up or repeat themselves? No  Do you experience ringing or noises in your ears? Tinnitus Do you have difficulty understanding soft or whispered voices? No  Do you feel that you have a problem with memory? No Do you often misplace items? No    Home Safety:  Do you have a smoke alarm at your residence?  Yes Do you have grab bars in the bathroom? No Do you have throw rugs in your house? Yes   Cognitive Testing  Alert? Yes Normal Appearance?Yes  Oriented to person? Yes Place? Yes  Time? Yes  Recall of three objects? Yes  Can perform simple calculations? Yes  Displays appropriate judgment?Yes  Can read the correct time from a watch face?Yes   List the Names of Other Physician/Practitioners you currently use:  See referral list for the physicians patient is currently seeing.  GYN physician  Orthopedist   Review of Systems: See above   Objective:     General appearance: Appears stated age and mildly obese  Head: Normocephalic, without obvious abnormality, atraumatic  Eyes: conj clear, EOMi PEERLA  Ears: normal TM's and external ear canals both ears  Nose: Nares normal. Septum midline. Mucosa normal. No drainage or sinus tenderness.  Throat: lips, mucosa, and tongue normal; teeth and gums normal  Neck: no adenopathy, no carotid bruit, no JVD, supple, symmetrical, trachea midline and thyroid not enlarged, symmetric, no tenderness/mass/nodules  No CVA tenderness.  Lungs: clear to auscultation bilaterally  Breasts: normal appearance, no masses or tenderness,  Heart: regular rate and rhythm, S1, S2 normal, no murmur, click, rub or gallop  Abdomen: soft, non-tender; bowel sounds normal; no masses, no organomegaly  Musculoskeletal: ROM normal  in all joints, no crepitus, no deformity, Normal muscle strengthen. Back  is symmetric, no curvature. Skin: Skin color, texture, turgor normal. No rashes or lesions  Lymph nodes: Cervical, supraclavicular, and axillary nodes normal.  Neurologic: CN 2 -12 Normal, Normal symmetric reflexes. Normal coordination and gait  Psych: Alert & Oriented x 3, Mood appear stable.    Assessment:    Annual wellness medicare exam   Plan:    During the course of the visit the patient was educated and counseled about appropriate screening and preventive  services including:   Annual mammogram  Prevnar   annual flu vaccine     Patient Instructions (the written plan) was given to the patient.  Medicare Attestation  I have personally reviewed:  The patient's medical and social history  Their use of alcohol, tobacco or illicit drugs  Their current medications and supplements  The patient's functional ability including ADLs,fall risks, home safety risks, cognitive, and hearing and visual impairment  Diet and physical activities  Evidence for depression or mood disorders  The patient's weight, height, BMI, and visual acuity have been recorded in the chart. I have made referrals, counseling, and provided education to the patient based on review of the above and I have provided the patient with a written personalized care plan for preventive services.

## 2015-02-02 ENCOUNTER — Telehealth: Payer: Self-pay

## 2015-02-02 NOTE — Telephone Encounter (Signed)
Patient contacted office inquiring about referral to hand surgeon: Dr. Amedeo Plenty. Last appt 12/30/14 for her physical, there is no mention in her note about her hand. I do not see where a referral was to be made. Please advise if you recall.

## 2015-02-02 NOTE — Telephone Encounter (Signed)
I thought pt was going to contact him herself

## 2015-02-03 NOTE — Telephone Encounter (Signed)
Patient notified

## 2015-02-06 DIAGNOSIS — M1712 Unilateral primary osteoarthritis, left knee: Secondary | ICD-10-CM | POA: Diagnosis not present

## 2015-03-10 DIAGNOSIS — M65332 Trigger finger, left middle finger: Secondary | ICD-10-CM | POA: Diagnosis not present

## 2015-03-10 DIAGNOSIS — M1812 Unilateral primary osteoarthritis of first carpometacarpal joint, left hand: Secondary | ICD-10-CM | POA: Diagnosis not present

## 2015-03-13 DIAGNOSIS — M1712 Unilateral primary osteoarthritis, left knee: Secondary | ICD-10-CM | POA: Diagnosis not present

## 2015-03-20 DIAGNOSIS — M1712 Unilateral primary osteoarthritis, left knee: Secondary | ICD-10-CM | POA: Diagnosis not present

## 2015-03-21 ENCOUNTER — Ambulatory Visit (INDEPENDENT_AMBULATORY_CARE_PROVIDER_SITE_OTHER): Payer: Medicare Other | Admitting: Family Medicine

## 2015-03-21 ENCOUNTER — Ambulatory Visit (INDEPENDENT_AMBULATORY_CARE_PROVIDER_SITE_OTHER): Payer: Medicare Other

## 2015-03-21 VITALS — BP 106/64 | HR 56 | Temp 98.3°F | Resp 16 | Ht 65.0 in | Wt 178.0 lb

## 2015-03-21 DIAGNOSIS — R0789 Other chest pain: Secondary | ICD-10-CM

## 2015-03-21 DIAGNOSIS — R079 Chest pain, unspecified: Secondary | ICD-10-CM

## 2015-03-21 MED ORDER — GI COCKTAIL ~~LOC~~
30.0000 mL | Freq: Once | ORAL | Status: AC
  Administered 2015-03-21: 30 mL via ORAL

## 2015-03-21 NOTE — Progress Notes (Signed)
Subjective:  This chart was scribed for [name], by [name], at [facility].  This patient was seen in room 4 and the patient's care was started at 1:01 PM.    Patient ID: [name], female    DOB: [birth year], 70 y.o.   MRN: [id number] Chief Complaint  Patient presents with  . Chest Pain    upper, several days, off and on, burning sensation, Tums have not worked    HPI HPI Comments: [name] is a 70 y.o. female who presents to the [facility] complaining of intermittent upper chest pain/burning sensation onset four days ago.  Patient states that it lasts a few-10 seconds and states that she just had one as she is talking in the office. She does not associate it with activity or eating.   She states that she has not changed her level of activity since it has started and has been doing gardening and raking in her yard recently.  She took Tums to see if it would alleviate her symptoms but states that it did not change anything. She has been belching in order to get rid of gas (on purpose) but states that it does not change the pain.  She denies any radiation from the pain in her chest, history of heart attacks, calf pain or swelling/history of blood clots. Denies coughing, sweating, nausea/vomitting, rashes, numbness in her arms. Her father had multiple heart attacks but is unsure of his age when he had his first one.  Her mother had high blood pressure and was an alcoholic. Denies any recent long travelling.  1 week ago, she ate a piece of chicken and states that she thinks she may have swallowed a whole piece on accident.   Patient has a history of high cholesterol and is being treated for "pre- high blood pressure" as it runs in her family.  She is on two different blood pressure medications and cholesterol medication.  She states that she has lost 25 pounds recently from weight watchers and has been eating  fruit,vegetables and meats.  Patient owns car washes around town.   2:00 PM Patient denies any change in her symptoms after her GI cocktail. She states that she takes ibuprofen occasionally when she is feeling aches or tired but has not taken any this week.      Patient Active Problem List   Diagnosis Date Noted  . Left knee DJD 01/24/2012  . Decreased libido 11/05/2011  . Osteoarthritis of left knee 11/11/2010  . Impaired glucose tolerance 11/11/2010  . Vitamin D deficiency 11/11/2010  . Obesity 07/14/2010  . Mitral valve prolapse 07/14/2010  . HYPERLIPIDEMIA 10/01/2007  . OBSTRUCTIVE SLEEP APNEA 10/01/2007   Past Medical History  Diagnosis Date  . Mitral valve prolapse     since age 13  . Overactive bladder   . Ringing in ears 2010    interfers with clarity of hearing at times  . Sleep apnea     mild -never tolerated cpap  . Stress incontinence in female     wears a pad  . Genital HSV 03/2013    Positive HSV 1 genital PCR  . Arthritis   . Hypertension   . Heart murmur   . Hyperlipidemia    Past Surgical History  Procedure Laterality Date  . Foot surgery  2013    CORRECTION OF "HAMMER TOES" -LEFT FOOT  . Hand surgery  RIGHT THUMB BONE SPUR  . Knee arthroscopy  2006, 2010, 2012    LEFT KNEE IN  2010/ RIGHT KNEE IN 2006 AND 2012  . Breast surgery      BENIGN RIGHT BR MASS  . Tubal ligation    . Oophorectomy      RIGHT OVARY REMOVED  . Total knee arthroplasty  2012    Right  . Knee arthroscopy  01/24/2012    Procedure: ARTHROSCOPY KNEE;  Surgeon: Johnn Hai, [name];  Location: [facility];  Service: Orthopedics;  Laterality: Left;  left knee arthroscopy with debridment, partial lateral menisectomy  . Back surgery    . Joint replacement    . Spine surgery     Allergies  Allergen Reactions  . Sulfa Antibiotics Hives  . Codeine Itching  . Morphine     REACTION: itch  . Sulfonamide Derivatives     REACTION: itch   Prior to Admission  medications   Medication Sig Start Date End Date Taking? Authorizing Provider  atenolol (TENORMIN) 50 MG tablet TAKE 1 TABLET BY MOUTH EVERY DAY 12/29/14  Yes Elby Showers, [name]  clobetasol cream (TEMOVATE) 0.05 %  04/07/14  Yes Historical Provider, [name]  co-enzyme Q-10 50 MG capsule Take 50 mg by mouth 2 (two) times daily.     Yes Historical Provider, [name]  desoximetasone (TOPICORT) 0.25 % cream  09/08/12  Yes Historical Provider, [name]  DULoxetine (CYMBALTA) 30 MG capsule TAKE ONE CAPSULE BY MOUTH EVERY DAY 01/01/15  Yes Elby Showers, [name]  DULoxetine (CYMBALTA) 60 MG capsule TAKE 1 CAPSULE (60 MG TOTAL) BY MOUTH DAILY. 07/18/14  Yes Elby Showers, [name]  Flaxseed, Linseed, (EQL FLAX SEED OIL) 1000 MG CAPS Take by mouth. Reported on 03/21/2015   Yes Historical Provider, [name]  fluticasone (CUTIVATE) 0.005 % ointment  09/08/12  Yes Historical Provider, [name]  ketoconazole (NIZORAL) 2 % cream Apply topically daily. 12/07/13  Yes Elby Showers, [name]  losartan (COZAAR) 50 MG tablet TAKE 1 TABLET BY MOUTH EVERY DAY 05/16/14  Yes Elby Showers, [name]  simvastatin (ZOCOR) 20 MG tablet TAKE 1 TABLET BY MOUTH EVERY DAY 12/29/14  Yes Elby Showers, [name]  triamcinolone ointment (KENALOG) 0.1 %  04/02/14  Yes Historical Provider, [name]  TURMERIC PO Take by mouth.     Yes Historical Provider, [name]  VOLTAREN 1 % GEL  11/05/12  Yes Historical Provider, [name]  valACYclovir (VALTREX) 500 MG tablet Take 1 tablet (500 mg total) by mouth 2 (two) times daily. 03/25/13   Anastasio Auerbach, [name]   Social History   Social History  . Marital Status: Widowed    Spouse Name: N/A  . Number of Children: 2  . 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 Not on file.   Social History Main Topics  . Smoking status: Never Smoker   . Smokeless tobacco: Never Used  . Alcohol Use: 0.0 oz/week    0 Standard drinks or equivalent per week     Comment: wine socially  . Drug Use: No  . Sexual Activity: Not Currently    Birth Control/ Protection:  Post-menopausal     Comment: 1st intercourse 68 yo-2 partners   Other Topics Concern  . Not on file   Social History Narrative   Drinks about 2 cups of coffee a day, 1 glass of tea a day        Review of Systems  Constitutional: Negative for fever  and chills.  Eyes: Negative for pain, redness and itching.  Respiratory: Negative for cough, choking and shortness of breath.   Cardiovascular: Positive for chest pain. Negative for leg swelling.  Gastrointestinal: Negative for nausea and vomiting.  Musculoskeletal: Negative for back pain, neck pain and neck stiffness.  Neurological: Negative for seizures, speech difficulty, weakness and numbness.       Objective:   Physical Exam  Constitutional: She is oriented to person, place, and time. She appears well-developed and well-nourished. No distress.  HENT:  Head: Normocephalic and atraumatic.  Eyes: Conjunctivae and EOM are normal.  Neck: Neck supple.  Cardiovascular: Normal rate.   No calf tenderness or lower extremity edema.   Pulmonary/Chest: Effort normal and breath sounds normal. No respiratory distress.  Musculoskeletal: Normal range of motion.  Neurological: She is alert and oriented to person, place, and time.  Skin: Skin is warm and dry.  Psychiatric: She has a normal mood and affect. Her behavior is normal.  Nursing note and vitals reviewed.   Filed Vitals:   03/21/15 1225  BP: 106/64  Pulse: 56  Temp: 98.3 F (36.8 C)  Resp: 16  Height: 5\' 5"  (1.651 m)  Weight: 178 lb (80.74 kg)  SpO2: 99%   EKG: sinus bradycardia no acute findings Dg Chest 2 View  03/21/2015  CLINICAL DATA:  Chest pain with burning sensation for 4 days EXAM: CHEST  2 VIEW COMPARISON:  06/21/2014 FINDINGS: Normal heart size. Clear lungs. No pneumothorax. No pleural effusion. IMPRESSION: No active cardiopulmonary disease. Electronically Signed   By: Marybelle Killings M.D.   On: 03/21/2015 14:11        Assessment & Plan:   MYANNA KELLY is  a 70 y.o. female Burning chest pain - Plan: gi cocktail (Maalox,Lidocaine,Donnatal)  Right-sided chest pain - Plan: EKG 12-Lead, DG Chest 2 View  Some component reproducible with palpation, possible chest wall pain from recent activity and gardening. Burning sensation more likely reflux, along with some belching. Minimal to no improvement with GI cocktail. No apparent concerning findings on EKG or CXR.   -Trial of Advil or Aleve over-the-counter for chest wall pain, information given in AVS, and Prilosec over-the-counter daily for possible reflux. If not improving into next week, return for recheck, sooner if worse.  -No rash, no dermatomal distribution of her pain, but advised if any spread of pain or rash, consider shingles. Less likely at this point  Meds ordered this encounter  Medications  . gi cocktail (Maalox,Lidocaine,Donnatal)    Sig:    Patient Instructions  With burning pain, may be due to heartburn, but as able to reproduce pain with pressing on area - may be chest wall pain. See information this below.  Your EKG and chest x-ray overall looked okay.  Can try Advil or Aleve over-the-counter as needed for the next few days, Prilosec over-the-counter once per day in case this may be from heartburn, but if pain is not improving into this next week, return for recheck and other possible testing or referral to specialist. . Sooner or to emergency room if worsening symptoms.  If burning pain moves across chest or back, or any rash noted, be seen right away, as this can be a sign of shingles. This is unlikely at this point.  Return to the clinic or go to the nearest emergency room if any of your symptoms worsen or new symptoms occur.   Nonspecific Chest Pain  Chest pain can be caused by many different conditions. There  is always a chance that your pain could be related to something serious, such as a heart attack or a blood clot in your lungs. Chest pain can also be caused by conditions  that are not life-threatening. If you have chest pain, it is very important to follow up with your health care provider. CAUSES  Chest pain can be caused by:  Heartburn.  Pneumonia or bronchitis.  Anxiety or stress.  Inflammation around your heart (pericarditis) or lung (pleuritis or pleurisy).  A blood clot in your lung.  A collapsed lung (pneumothorax). It can develop suddenly on its own (spontaneous pneumothorax) or from trauma to the chest.  Shingles infection (varicella-zoster virus).  Heart attack.  Damage to the bones, muscles, and cartilage that make up your chest wall. This can include:  Bruised bones due to injury.  Strained muscles or cartilage due to frequent or repeated coughing or overwork.  Fracture to one or more ribs.  Sore cartilage due to inflammation (costochondritis). RISK FACTORS  Risk factors for chest pain may include:  Activities that increase your risk for trauma or injury to your chest.  Respiratory infections or conditions that cause frequent coughing.  Medical conditions or overeating that can cause heartburn.  Heart disease or family history of heart disease.  Conditions or health behaviors that increase your risk of developing a blood clot.  Having had chicken pox (varicella zoster). SIGNS AND SYMPTOMS Chest pain can feel like:  Burning or tingling on the surface of your chest or deep in your chest.  Crushing, pressure, aching, or squeezing pain.  Dull or sharp pain that is worse when you move, cough, or take a deep breath.  Pain that is also felt in your back, neck, shoulder, or arm, or pain that spreads to any of these areas. Your chest pain may come and go, or it may stay constant. DIAGNOSIS Lab tests or other studies may be needed to find the cause of your pain. Your health care provider may have you take a test called an ambulatory ECG (electrocardiogram). An ECG records your heartbeat patterns at the time the test is  performed. You may also have other tests, such as:  Transthoracic echocardiogram (TTE). During echocardiography, sound waves are used to create a picture of all of the heart structures and to look at how blood flows through your heart.  Transesophageal echocardiogram (TEE).This is a more advanced imaging test that obtains images from inside your body. It allows your health care provider to see your heart in finer detail.  Cardiac monitoring. This allows your health care provider to monitor your heart rate and rhythm in real time.  Holter monitor. This is a portable device that records your heartbeat and can help to diagnose abnormal heartbeats. It allows your health care provider to track your heart activity for several days, if needed.  Stress tests. These can be done through exercise or by taking medicine that makes your heart beat more quickly.  Blood tests.  Imaging tests. TREATMENT  Your treatment depends on what is causing your chest pain. Treatment may include:  Medicines. These may include:  Acid blockers for heartburn.  Anti-inflammatory medicine.  Pain medicine for inflammatory conditions.  Antibiotic medicine, if an infection is present.  Medicines to dissolve blood clots.  Medicines to treat coronary artery disease.  Supportive care for conditions that do not require medicines. This may include:  Resting.  Applying heat or cold packs to injured areas.  Limiting activities until pain decreases. HOME  CARE INSTRUCTIONS  If you were prescribed an antibiotic medicine, finish it all even if you start to feel better.  Avoid any activities that bring on chest pain.  Do not use any tobacco products, including cigarettes, chewing tobacco, or electronic cigarettes. If you need help quitting, ask your health care provider.  Do not drink alcohol.  Take medicines only as directed by your health care provider.  Keep all follow-up visits as directed by your health care  provider. This is important. This includes any further testing if your chest pain does not go away.  If heartburn is the cause for your chest pain, you may be told to keep your head raised (elevated) while sleeping. This reduces the chance that acid will go from your stomach into your esophagus.  Make lifestyle changes as directed by your health care provider. These may include:  Getting regular exercise. Ask your health care provider to suggest some activities that are safe for you.  Eating a heart-healthy diet. A registered dietitian can help you to learn healthy eating options.  Maintaining a healthy weight.  Managing diabetes, if necessary.  Reducing stress. SEEK MEDICAL CARE IF:  Your chest pain does not go away after treatment.  You have a rash with blisters on your chest.  You have a fever. SEEK IMMEDIATE MEDICAL CARE IF:   Your chest pain is worse.  You have an increasing cough, or you cough up blood.  You have severe abdominal pain.  You have severe weakness.  You faint.  You have chills.  You have sudden, unexplained chest discomfort.  You have sudden, unexplained discomfort in your arms, back, neck, or jaw.  You have shortness of breath at any time.  You suddenly start to sweat, or your skin gets clammy.  You feel nauseous or you vomit.  You suddenly feel light-headed or dizzy.  Your heart begins to beat quickly, or it feels like it is skipping beats. These symptoms may represent a serious problem that is an emergency. Do not wait to see if the symptoms will go away. Get medical help right away. Call your local emergency services (911 in the [place] Do not drive yourself to the hospital.   This information is not intended to replace advice given to you by your health care provider. Make sure you discuss any questions you have with your health care provider.   Document Released: 10/10/2004 Document Revised: 01/21/2014 Document Reviewed:  08/06/2013 Elsevier Interactive Patient Education 2016 Else[employer]   Chest Wall Pain Chest wall pain is pain in or around the bones and muscles of your chest. Sometimes, an injury causes this pain. Sometimes, the cause may not be known. This pain may take several weeks or longer to get better. HOME CARE INSTRUCTIONS  Pay attention to any changes in your symptoms. Take these actions to help with your pain:   Rest as told by your health care provider.   Avoid activities that cause pain. These include any activities that use your chest muscles or your abdominal and side muscles to lift heavy items.   If directed, apply ice to the painful area:  Put ice in a plastic bag.  Place a towel between your skin and the bag.  Leave the ice on for 20 minutes, 2-3 times per day.  Take over-the-counter and prescription medicines only as told by your health care provider.  Do not use tobacco products, including cigarettes, chewing tobacco, and e-cigarettes. If you need help quitting, ask your health  care provider.  Keep all follow-up visits as told by your health care provider. This is important. SEEK MEDICAL CARE IF:  You have a fever.  Your chest pain becomes worse.  You have new symptoms. SEEK IMMEDIATE MEDICAL CARE IF:  You have nausea or vomiting.  You feel sweaty or light-headed.  You have a cough with phlegm (sputum) or you cough up blood.  You develop shortness of breath.   This information is not intended to replace advice given to you by your health care provider. Make sure you discuss any questions you have with your health care provider.   Document Released: 12/31/2004 Document Revised: 09/21/2014 Document Reviewed: 03/28/2014 Elsevier Interactive Patient Education [employer].   Because you received an x-ray today, you will receive an invoice from [facility]. Please contact [facility] at [phone] with questions or concerns regarding  your invoice. Our billing staff will not be able to assist you with those questions.     I personally performed the services described in this documentation, which was scribed in my presence. The recorded information has been reviewed and considered, and addended by me as needed.

## 2015-03-21 NOTE — Patient Instructions (Addendum)
With burning pain, may be due to heartburn, but as able to reproduce pain with pressing on area - may be chest wall pain. See information this below.  Your EKG and chest x-ray overall looked okay.  Can try Advil or Aleve over-the-counter as needed for the next few days, Prilosec over-the-counter once per day in case this may be from heartburn, but if pain is not improving into this next week, return for recheck and other possible testing or referral to specialist. . Sooner or to emergency room if worsening symptoms.  If burning pain moves across chest or back, or any rash noted, be seen right away, as this can be a sign of shingles. This is unlikely at this point.  Return to the clinic or go to the nearest emergency room if any of your symptoms worsen or new symptoms occur.   Nonspecific Chest Pain  Chest pain can be caused by many different conditions. There is always a chance that your pain could be related to something serious, such as a heart attack or a blood clot in your lungs. Chest pain can also be caused by conditions that are not life-threatening. If you have chest pain, it is very important to follow up with your health care provider. CAUSES  Chest pain can be caused by:  Heartburn.  Pneumonia or bronchitis.  Anxiety or stress.  Inflammation around your heart (pericarditis) or lung (pleuritis or pleurisy).  A blood clot in your lung.  A collapsed lung (pneumothorax). It can develop suddenly on its own (spontaneous pneumothorax) or from trauma to the chest.  Shingles infection (varicella-zoster virus).  Heart attack.  Damage to the bones, muscles, and cartilage that make up your chest wall. This can include:  Bruised bones due to injury.  Strained muscles or cartilage due to frequent or repeated coughing or overwork.  Fracture to one or more ribs.  Sore cartilage due to inflammation (costochondritis). RISK FACTORS  Risk factors for chest pain may  include:  Activities that increase your risk for trauma or injury to your chest.  Respiratory infections or conditions that cause frequent coughing.  Medical conditions or overeating that can cause heartburn.  Heart disease or family history of heart disease.  Conditions or health behaviors that increase your risk of developing a blood clot.  Having had chicken pox (varicella zoster). SIGNS AND SYMPTOMS Chest pain can feel like:  Burning or tingling on the surface of your chest or deep in your chest.  Crushing, pressure, aching, or squeezing pain.  Dull or sharp pain that is worse when you move, cough, or take a deep breath.  Pain that is also felt in your back, neck, shoulder, or arm, or pain that spreads to any of these areas. Your chest pain may come and go, or it may stay constant. DIAGNOSIS Lab tests or other studies may be needed to find the cause of your pain. Your health care provider may have you take a test called an ambulatory ECG (electrocardiogram). An ECG records your heartbeat patterns at the time the test is performed. You may also have other tests, such as:  Transthoracic echocardiogram (TTE). During echocardiography, sound waves are used to create a picture of all of the heart structures and to look at how blood flows through your heart.  Transesophageal echocardiogram (TEE).This is a more advanced imaging test that obtains images from inside your body. It allows your health care provider to see your heart in finer detail.  Cardiac monitoring. This allows  your health care provider to monitor your heart rate and rhythm in real time.  Holter monitor. This is a portable device that records your heartbeat and can help to diagnose abnormal heartbeats. It allows your health care provider to track your heart activity for several days, if needed.  Stress tests. These can be done through exercise or by taking medicine that makes your heart beat more quickly.  Blood  tests.  Imaging tests. TREATMENT  Your treatment depends on what is causing your chest pain. Treatment may include:  Medicines. These may include:  Acid blockers for heartburn.  Anti-inflammatory medicine.  Pain medicine for inflammatory conditions.  Antibiotic medicine, if an infection is present.  Medicines to dissolve blood clots.  Medicines to treat coronary artery disease.  Supportive care for conditions that do not require medicines. This may include:  Resting.  Applying heat or cold packs to injured areas.  Limiting activities until pain decreases. HOME CARE INSTRUCTIONS  If you were prescribed an antibiotic medicine, finish it all even if you start to feel better.  Avoid any activities that bring on chest pain.  Do not use any tobacco products, including cigarettes, chewing tobacco, or electronic cigarettes. If you need help quitting, ask your health care provider.  Do not drink alcohol.  Take medicines only as directed by your health care provider.  Keep all follow-up visits as directed by your health care provider. This is important. This includes any further testing if your chest pain does not go away.  If heartburn is the cause for your chest pain, you may be told to keep your head raised (elevated) while sleeping. This reduces the chance that acid will go from your stomach into your esophagus.  Make lifestyle changes as directed by your health care provider. These may include:  Getting regular exercise. Ask your health care provider to suggest some activities that are safe for you.  Eating a heart-healthy diet. A registered dietitian can help you to learn healthy eating options.  Maintaining a healthy weight.  Managing diabetes, if necessary.  Reducing stress. SEEK MEDICAL CARE IF:  Your chest pain does not go away after treatment.  You have a rash with blisters on your chest.  You have a fever. SEEK IMMEDIATE MEDICAL CARE IF:   Your chest  pain is worse.  You have an increasing cough, or you cough up blood.  You have severe abdominal pain.  You have severe weakness.  You faint.  You have chills.  You have sudden, unexplained chest discomfort.  You have sudden, unexplained discomfort in your arms, back, neck, or jaw.  You have shortness of breath at any time.  You suddenly start to sweat, or your skin gets clammy.  You feel nauseous or you vomit.  You suddenly feel light-headed or dizzy.  Your heart begins to beat quickly, or it feels like it is skipping beats. These symptoms may represent a serious problem that is an emergency. Do not wait to see if the symptoms will go away. Get medical help right away. Call your local emergency services (911 in the [place] Do not drive yourself to the hospital.   This information is not intended to replace advice given to you by your health care provider. Make sure you discuss any questions you have with your health care provider.   Document Released: 10/10/2004 Document Revised: 01/21/2014 Document Reviewed: 08/06/2013 Elsevier Interactive Patient Education 2016 Elsevier Inc.   Chest Wall Pain Chest wall pain is pain in or  around the bones and muscles of your chest. Sometimes, an injury causes this pain. Sometimes, the cause may not be known. This pain may take several weeks or longer to get better. HOME CARE INSTRUCTIONS  Pay attention to any changes in your symptoms. Take these actions to help with your pain:   Rest as told by your health care provider.   Avoid activities that cause pain. These include any activities that use your chest muscles or your abdominal and side muscles to lift heavy items.   If directed, apply ice to the painful area:  Put ice in a plastic bag.  Place a towel between your skin and the bag.  Leave the ice on for 20 minutes, 2-3 times per day.  Take over-the-counter and prescription medicines only as told by your health care  provider.  Do not use tobacco products, including cigarettes, chewing tobacco, and e-cigarettes. If you need help quitting, ask your health care provider.  Keep all follow-up visits as told by your health care provider. This is important. SEEK MEDICAL CARE IF:  You have a fever.  Your chest pain becomes worse.  You have new symptoms. SEEK IMMEDIATE MEDICAL CARE IF:  You have nausea or vomiting.  You feel sweaty or light-headed.  You have a cough with phlegm (sputum) or you cough up blood.  You develop shortness of breath.   This information is not intended to replace advice given to you by your health care provider. Make sure you discuss any questions you have with your health care provider.   Document Released: 12/31/2004 Document Revised: 09/21/2014 Document Reviewed: 03/28/2014 Elsevier Interactive Patient Education [employer].   Because you received an x-ray today, you will receive an invoice from [facility]. Please contact [facility] at [phone] with questions or concerns regarding your invoice. Our billing staff will not be able to assist you with those questions.

## 2015-03-27 DIAGNOSIS — M1712 Unilateral primary osteoarthritis, left knee: Secondary | ICD-10-CM | POA: Diagnosis not present

## 2015-04-03 DIAGNOSIS — M65332 Trigger finger, left middle finger: Secondary | ICD-10-CM | POA: Diagnosis not present

## 2015-04-03 DIAGNOSIS — M1812 Unilateral primary osteoarthritis of first carpometacarpal joint, left hand: Secondary | ICD-10-CM | POA: Diagnosis not present

## 2015-04-17 ENCOUNTER — Ambulatory Visit (INDEPENDENT_AMBULATORY_CARE_PROVIDER_SITE_OTHER): Payer: Medicare Other | Admitting: Internal Medicine

## 2015-04-17 ENCOUNTER — Encounter: Payer: Self-pay | Admitting: Internal Medicine

## 2015-04-17 VITALS — BP 104/70 | HR 55 | Temp 98.2°F | Resp 20 | Wt 188.0 lb

## 2015-04-17 DIAGNOSIS — J029 Acute pharyngitis, unspecified: Secondary | ICD-10-CM

## 2015-04-17 DIAGNOSIS — J069 Acute upper respiratory infection, unspecified: Secondary | ICD-10-CM | POA: Diagnosis not present

## 2015-04-17 DIAGNOSIS — H6502 Acute serous otitis media, left ear: Secondary | ICD-10-CM | POA: Diagnosis not present

## 2015-04-17 LAB — POCT RAPID STREP A (OFFICE): Rapid Strep A Screen: NEGATIVE

## 2015-04-17 MED ORDER — AMOXICILLIN-POT CLAVULANATE 875-125 MG PO TABS: 1.0000 | ORAL_TABLET | Freq: Two times a day (BID) | ORAL | Status: DC

## 2015-04-17 NOTE — Progress Notes (Signed)
   Subjective:    Patient ID: [name], female    DOB: 1945-07-15, 70 y.o.   MRN: [id number]  HPI She went to a horse show recently and came down with sore throat symptoms. She was around a lot of young people. Has had congestion, fever and malaise.    Review of Systems     Objective:   Physical Exam  Rapid strep screen is negative. Pharynx is red without exudate. Right TM clear. Left TM slightly full and centrally injected. Neck supple. No adenopathy. Chest clear to auscultation. Sounds nasally congested.      Assessment & Plan:  Acute left serous otitis media  Acute pharyngitis  Acute URI  Plan: Augmentin 875 mg twice daily for 10 days

## 2015-04-17 NOTE — Patient Instructions (Signed)
Take Augmentin 875 mg twice a day for 10 days.

## 2015-04-18 ENCOUNTER — Telehealth: Payer: Self-pay | Admitting: Internal Medicine

## 2015-04-18 MED ORDER — ONDANSETRON HCL 4 MG PO TABS: 4.0000 mg | ORAL_TABLET | Freq: Three times a day (TID) | ORAL | Status: DC | PRN

## 2015-04-18 NOTE — Telephone Encounter (Signed)
Patient states that she is vomiting and still has diarrhea; she states that as long as she keeps herself empty food wise. She is going to try once more with food. Will call in the am with a report.

## 2015-04-18 NOTE — Telephone Encounter (Signed)
Call in Zofran 4 mg tablet. #20 one po q 8 hours prn nausea.  Must take Augmentin with a meal. It can cause diarrhea and nausea.  May need to switch if cannot take.

## 2015-04-18 NOTE — Telephone Encounter (Signed)
Patient states that she took one dose of Augmentin last night and vomited after dinner for at least 2 hours with diarrhea.  She did take with food.  She said she DID take a dose this morning with gatorade ONLY because she couldn't stand the thoughts of food this a.m.   States that she hasn't vomited any more today and no diarrhea today.  However she's VERY nauseaous.  And, she has low grade fever today.  States she's VERY weak and feels much worse today.   Wants to know if she could possibly have the flu?  Says that she did go to [place] to care for her daughter who had the flu.  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    Do you think she needs something for nausea?  Or, please advise further instruction for patient.    Pharmacy:  [facility] [address]

## 2015-04-22 ENCOUNTER — Other Ambulatory Visit: Payer: Self-pay | Admitting: Internal Medicine

## 2015-04-25 ENCOUNTER — Ambulatory Visit: Payer: Medicare Other | Admitting: Internal Medicine

## 2015-05-14 ENCOUNTER — Other Ambulatory Visit: Payer: Self-pay | Admitting: Internal Medicine

## 2015-06-05 ENCOUNTER — Other Ambulatory Visit: Payer: Self-pay

## 2015-06-05 DIAGNOSIS — Z1231 Encounter for screening mammogram for malignant neoplasm of breast: Secondary | ICD-10-CM

## 2015-06-16 DIAGNOSIS — M1712 Unilateral primary osteoarthritis, left knee: Secondary | ICD-10-CM | POA: Diagnosis not present

## 2015-06-23 ENCOUNTER — Ambulatory Visit
Admission: RE | Admit: 2015-06-23 | Discharge: 2015-06-23 | Disposition: A | Discharge status: Home / Self Care | Payer: Medicare Other | Source: Ambulatory Visit

## 2015-06-23 ENCOUNTER — Other Ambulatory Visit: Payer: Self-pay | Admitting: Internal Medicine

## 2015-06-23 ENCOUNTER — Other Ambulatory Visit: Payer: Self-pay | Admitting: Gynecology

## 2015-06-23 DIAGNOSIS — Z1231 Encounter for screening mammogram for malignant neoplasm of breast: Secondary | ICD-10-CM | POA: Diagnosis not present

## 2015-06-26 ENCOUNTER — Other Ambulatory Visit: Payer: Medicare Other | Admitting: Internal Medicine

## 2015-06-28 ENCOUNTER — Other Ambulatory Visit: Payer: Self-pay | Admitting: Gynecology

## 2015-06-28 DIAGNOSIS — R928 Other abnormal and inconclusive findings on diagnostic imaging of breast: Secondary | ICD-10-CM

## 2015-06-30 ENCOUNTER — Ambulatory Visit: Payer: Medicare Other | Admitting: Internal Medicine

## 2015-07-05 ENCOUNTER — Other Ambulatory Visit: Payer: Self-pay | Admitting: Gynecology

## 2015-07-05 ENCOUNTER — Ambulatory Visit
Admission: RE | Admit: 2015-07-05 | Discharge: 2015-07-05 | Disposition: A | Discharge status: Home / Self Care | Payer: Medicare Other | Source: Ambulatory Visit | Attending: Gynecology | Admitting: Gynecology

## 2015-07-05 DIAGNOSIS — R928 Other abnormal and inconclusive findings on diagnostic imaging of breast: Secondary | ICD-10-CM

## 2015-07-05 DIAGNOSIS — N6489 Other specified disorders of breast: Secondary | ICD-10-CM | POA: Diagnosis not present

## 2015-07-05 DIAGNOSIS — R921 Mammographic calcification found on diagnostic imaging of breast: Secondary | ICD-10-CM

## 2015-07-11 ENCOUNTER — Other Ambulatory Visit: Payer: Self-pay | Admitting: Gynecology

## 2015-07-11 DIAGNOSIS — R921 Mammographic calcification found on diagnostic imaging of breast: Secondary | ICD-10-CM

## 2015-07-12 ENCOUNTER — Ambulatory Visit
Admission: RE | Admit: 2015-07-12 | Discharge: 2015-07-12 | Disposition: A | Discharge status: Home / Self Care | Payer: Medicare Other | Source: Ambulatory Visit | Attending: Gynecology | Admitting: Gynecology

## 2015-07-12 ENCOUNTER — Other Ambulatory Visit: Payer: Self-pay | Admitting: Gynecology

## 2015-07-12 DIAGNOSIS — R921 Mammographic calcification found on diagnostic imaging of breast: Secondary | ICD-10-CM

## 2015-07-12 DIAGNOSIS — N6320 Unspecified lump in the left breast, unspecified quadrant: Secondary | ICD-10-CM

## 2015-07-12 DIAGNOSIS — D241 Benign neoplasm of right breast: Secondary | ICD-10-CM | POA: Diagnosis not present

## 2015-07-12 DIAGNOSIS — R920 Mammographic microcalcification found on diagnostic imaging of breast: Secondary | ICD-10-CM | POA: Diagnosis not present

## 2015-07-13 ENCOUNTER — Ambulatory Visit: Payer: Medicare Other

## 2015-07-31 ENCOUNTER — Other Ambulatory Visit: Payer: Medicare Other | Admitting: Internal Medicine

## 2015-07-31 DIAGNOSIS — Z79899 Other long term (current) drug therapy: Secondary | ICD-10-CM | POA: Diagnosis not present

## 2015-07-31 DIAGNOSIS — E785 Hyperlipidemia, unspecified: Secondary | ICD-10-CM

## 2015-07-31 DIAGNOSIS — E119 Type 2 diabetes mellitus without complications: Secondary | ICD-10-CM

## 2015-07-31 LAB — HEPATIC FUNCTION PANEL
ALBUMIN: 4.2 g/dL (ref 3.6–5.1)
ALT: 18 U/L (ref 6–29)
AST: 18 U/L (ref 10–35)
Alkaline Phosphatase: 66 U/L (ref 33–130)
BILIRUBIN TOTAL: 0.6 mg/dL (ref 0.2–1.2)
Bilirubin, Direct: 0.1 mg/dL (ref <=0.2)
Indirect Bilirubin: 0.5 mg/dL (ref 0.2–1.2)
Total Protein: 6.3 g/dL (ref 6.1–8.1)

## 2015-07-31 LAB — HEMOGLOBIN A1C
HEMOGLOBIN A1C: 5.5 % (ref <5.7)
Mean Plasma Glucose: 111 mg/dL

## 2015-07-31 LAB — LIPID PANEL
CHOL/HDL RATIO: 2.6 ratio (ref <=5.0)
Cholesterol: 161 mg/dL (ref 125–200)
HDL: 61 mg/dL (ref >=46)
LDL Cholesterol: 88 mg/dL (ref <130)
TRIGLYCERIDES: 59 mg/dL (ref <150)
VLDL: 12 mg/dL (ref <30)

## 2015-08-01 ENCOUNTER — Other Ambulatory Visit: Payer: Self-pay | Admitting: Gynecology

## 2015-08-01 DIAGNOSIS — N6320 Unspecified lump in the left breast, unspecified quadrant: Secondary | ICD-10-CM

## 2015-08-04 ENCOUNTER — Encounter: Payer: Self-pay | Admitting: Internal Medicine

## 2015-08-04 ENCOUNTER — Ambulatory Visit (INDEPENDENT_AMBULATORY_CARE_PROVIDER_SITE_OTHER): Payer: Medicare Other | Admitting: Internal Medicine

## 2015-08-04 VITALS — BP 116/68 | HR 68 | Temp 97.6°F | Resp 16 | Ht 65.0 in | Wt 175.0 lb

## 2015-08-04 DIAGNOSIS — E8881 Metabolic syndrome: Secondary | ICD-10-CM

## 2015-08-04 DIAGNOSIS — I10 Essential (primary) hypertension: Secondary | ICD-10-CM | POA: Diagnosis not present

## 2015-08-04 DIAGNOSIS — R7302 Impaired glucose tolerance (oral): Secondary | ICD-10-CM

## 2015-08-04 DIAGNOSIS — E669 Obesity, unspecified: Secondary | ICD-10-CM | POA: Diagnosis not present

## 2015-08-04 DIAGNOSIS — M5417 Radiculopathy, lumbosacral region: Secondary | ICD-10-CM | POA: Diagnosis not present

## 2015-08-04 DIAGNOSIS — M1712 Unilateral primary osteoarthritis, left knee: Secondary | ICD-10-CM | POA: Diagnosis not present

## 2015-08-04 DIAGNOSIS — G4733 Obstructive sleep apnea (adult) (pediatric): Secondary | ICD-10-CM | POA: Diagnosis not present

## 2015-08-07 ENCOUNTER — Ambulatory Visit
Admission: RE | Admit: 2015-08-07 | Discharge: 2015-08-07 | Disposition: A | Discharge status: Home / Self Care | Payer: Medicare Other | Source: Ambulatory Visit | Attending: Gynecology | Admitting: Gynecology

## 2015-08-07 DIAGNOSIS — R928 Other abnormal and inconclusive findings on diagnostic imaging of breast: Secondary | ICD-10-CM | POA: Diagnosis not present

## 2015-08-07 DIAGNOSIS — N6320 Unspecified lump in the left breast, unspecified quadrant: Secondary | ICD-10-CM

## 2015-08-07 DIAGNOSIS — N6012 Diffuse cystic mastopathy of left breast: Secondary | ICD-10-CM | POA: Diagnosis not present

## 2015-08-07 HISTORY — PX: BREAST BIOPSY: SHX20

## 2015-08-09 ENCOUNTER — Encounter: Payer: Self-pay | Admitting: Gynecology

## 2015-08-09 ENCOUNTER — Ambulatory Visit (INDEPENDENT_AMBULATORY_CARE_PROVIDER_SITE_OTHER): Payer: Medicare Other | Admitting: Gynecology

## 2015-08-09 VITALS — BP 122/76 | Ht 66.0 in | Wt 177.0 lb

## 2015-08-09 DIAGNOSIS — Z01419 Encounter for gynecological examination (general) (routine) without abnormal findings: Secondary | ICD-10-CM | POA: Diagnosis not present

## 2015-08-09 DIAGNOSIS — N952 Postmenopausal atrophic vaginitis: Secondary | ICD-10-CM | POA: Diagnosis not present

## 2015-08-09 NOTE — Patient Instructions (Signed)

## 2015-08-09 NOTE — Progress Notes (Signed)
    EMAHNI MEINHART [birth year] [id number]        70 y.o.  [id number]  for breast pelvic exam  Past medical history,surgical history, problem list, medications, allergies, family history and social history were all reviewed and documented as reviewed in the EPIC chart.  ROS:  Performed with pertinent positives and negatives included in the history, assessment and plan.   Additional significant findings :  None   Exam: [name] assistant Vitals:   08/09/15 1151  BP: 122/76  Weight: 177 lb (80.3 kg)  Height: 5\' 6"  (1.676 m)   General appearance:  Normal affect, orientation and appearance. Skin: Grossly normal HEENT: Without gross lesions.  No cervical or supraclavicular adenopathy. Thyroid normal.  Lungs:  Clear without wheezing, rales or rhonchi Cardiac: RR, without RMG Abdominal:  Soft, nontender, without masses, guarding, rebound, organomegaly or hernia Breasts:  Examined lying and sitting without masses, retractions, discharge or axillary adenopathy. Pelvic:  Ext/BUS/Vagina With atrophic changes  Cervix with atrophic changes  Uterus anteverted, normal size, shape and contour, midline and mobile nontender   Adnexa without masses or tenderness    Anus and perineum normal   Rectovaginal normal sphincter tone without palpated masses or tenderness.    Assessment/Plan:  70 y.o. [id number] female for breast and pelvic exam.   1. Postmenopausal/atrophic genital changes. Doing well without significant hot flushes, night sweats, vaginal dryness or any vaginal bleeding. Continue monitor report any issues. 2. Pap smear 2015. No Pap smear done today. No history of abnormal Pap smears previously. Options to stop screening altogether or less frequent screening current screening guidelines reviewed. Will readdress on an annual basis. 3. DEXA 2015. Plan repeat at 5 year interval. Increase calcium vitamin D reviewed. 4. Colonoscopy due now I reminded patient to schedule and she agrees to do  so. 5. Mammography 06/2015. Had follow up biopsy of questionable area which turned out to be benign. Follow up mammogram per radiology recommendations. SBE monthly reviewed. 6. Health maintenance. No routine lab work done as patient reports this done elsewhere. Follow up 1 year, sooner as needed.   Anastasio Auerbach MD, 12:22 PM 08/09/2015

## 2015-08-14 NOTE — Patient Instructions (Signed)
It was pleasure to see you today. Lab work is entirely within normal limits. Please continue diet and exercise and weight loss efforts. Return in 6 months for physical exam.

## 2015-08-14 NOTE — Progress Notes (Signed)
   Subjective:    Patient ID: [name], female    DOB: [birth year], 70 y.o.   MRN: [id number]  HPI 70 year old Female in today for six-month recheck. History of hyperlipidemia, hypertension, and impaired glucose tolerance. History of chronic back pain treated with Cymbalta.   Is to have a needle biopsy of breast lesion in the near future  Review of Systems see above no new complaints     Objective:   Physical Exam Skin warm and dry. Nodes none. Neck is supple without JVD thyromegaly or carotid bruits. Chest clear. Cardiac exam regular rate and rhythm normal S1 and S2. Extremities without edema Hemoglobin A1c 5.5%. Lipid panel and liver functions are normal      Assessment & Plan:  Impaired glucose tolerance  Essential hypertension  Hyperlipidemia  Chronic back pain  Obstructive sleep apnea  Osteoarthritis of left knee  History of vitamin D deficiency  Plan: Continue same medications and return in 6 months for physical examination

## 2015-08-18 ENCOUNTER — Other Ambulatory Visit: Payer: Self-pay | Admitting: Internal Medicine

## 2015-09-15 ENCOUNTER — Other Ambulatory Visit: Payer: Self-pay

## 2015-09-29 ENCOUNTER — Encounter: Payer: Self-pay | Admitting: Internal Medicine

## 2015-09-29 ENCOUNTER — Ambulatory Visit (INDEPENDENT_AMBULATORY_CARE_PROVIDER_SITE_OTHER): Payer: Medicare Other | Admitting: Internal Medicine

## 2015-09-29 VITALS — BP 120/80 | HR 51 | Wt 179.0 lb

## 2015-09-29 DIAGNOSIS — N898 Other specified noninflammatory disorders of vagina: Secondary | ICD-10-CM | POA: Diagnosis not present

## 2015-09-29 DIAGNOSIS — R350 Frequency of micturition: Secondary | ICD-10-CM

## 2015-09-29 LAB — POC URINALSYSI DIPSTICK (AUTOMATED)

## 2015-09-29 MED ORDER — CLOTRIMAZOLE-BETAMETHASONE 1-0.05 % EX CREA: TOPICAL_CREAM | Freq: Two times a day (BID) | CUTANEOUS | Status: DC

## 2015-10-02 ENCOUNTER — Telehealth: Payer: Self-pay | Admitting: Internal Medicine

## 2015-10-02 ENCOUNTER — Encounter: Payer: Self-pay | Admitting: Internal Medicine

## 2015-10-02 LAB — URINE CULTURE

## 2015-10-02 MED ORDER — CIPROFLOXACIN HCL 500 MG PO TABS: 500.0000 mg | ORAL_TABLET | Freq: Two times a day (BID) | ORAL | 0 refills | Status: DC

## 2015-10-02 NOTE — Telephone Encounter (Signed)
Urine culture 50-100,000 colonies per milliliter Escherichia coli sensitive to Cipro. Call in Cipro 500 mg twice daily for 7 days. Patient still having urinary frequency and irritation.

## 2015-10-09 NOTE — Progress Notes (Signed)
   Subjective:    Patient ID: [name], female    DOB: 1945/04/02, 70 y.o.   MRN: [id number]  HPI patient saw her gynecologist July 26, Dr. Phineas Real for regular GYN exam. History of postmenopausal atrophic vaginitis.  Now complaining of frequent urination and vaginal irritation.  Is sexually active.    Review of Systems as above     Objective:   Physical Exam Redness about introitus       Assessment & Plan:  Possible UTI. Urinalysis by dipstick mildly abnormal. Culture sent  Vaginal irritation  Plan: Lotrisone cream to introital area twice a day. Await urine culture results.  Addendum: Urine culture 50-100,000 colonies per milliliter Escherichia coli sensitive to Cipro. Treat with Cipro 500 mg twice daily for 7 days. Patient continued to have urinary frequency and irritation despite Lotrisone cream.

## 2015-10-09 NOTE — Patient Instructions (Signed)
Lotrisone cream to introital area twice a day. Await urine culture results.

## 2015-10-16 DIAGNOSIS — K5730 Diverticulosis of large intestine without perforation or abscess without bleeding: Secondary | ICD-10-CM | POA: Diagnosis not present

## 2015-10-16 DIAGNOSIS — Z1211 Encounter for screening for malignant neoplasm of colon: Secondary | ICD-10-CM | POA: Diagnosis not present

## 2015-10-20 ENCOUNTER — Ambulatory Visit (INDEPENDENT_AMBULATORY_CARE_PROVIDER_SITE_OTHER): Payer: Medicare Other | Admitting: Physician Assistant

## 2015-10-20 VITALS — BP 124/72 | HR 63 | Temp 98.2°F | Resp 16 | Ht 66.0 in | Wt 182.6 lb

## 2015-10-20 DIAGNOSIS — R030 Elevated blood-pressure reading, without diagnosis of hypertension: Secondary | ICD-10-CM | POA: Insufficient documentation

## 2015-10-20 DIAGNOSIS — Z23 Encounter for immunization: Secondary | ICD-10-CM | POA: Diagnosis not present

## 2015-10-20 DIAGNOSIS — M25562 Pain in left knee: Secondary | ICD-10-CM

## 2015-10-20 NOTE — Progress Notes (Signed)
Patient ID: [name], female    DOB: Nov 16, 1945, 70 y.o.   MRN: [id number]  PCP: Elby Showers, MD  Subjective:   Chief Complaint  Patient presents with  . Knee Pain    chronic knee pain, sees Dr. Georgana Curio at [facility], has appt with [place] Monday at 1pm   . Knee Pain    hit left foot this morning on curve at work, and pain has gotten increasely worse    HPI Presents for evaluation of LEFT knee pain.  She has chronic knee pain due to OA and DJD.  Today she mistepped and tripped over a curb, and developed increased pain in the LEFT knee. She took ibuprofen 600 mg with some relief. Pain is increased with weight bearing, and relieved at rest. She has noted no swelling, giving way, paresthesias. She presents here concerned that she may have injured herself more significantly than she thinks, desiring reassurance that she can self-treat until she sees her orthopedic specialist on 10/23/15.    Review of Systems As above.    Patient Active Problem List   Diagnosis Date Noted  . Left knee DJD 01/24/2012  . Decreased libido 11/05/2011  . Osteoarthritis of left knee 11/11/2010  . Impaired glucose tolerance 11/11/2010  . Vitamin D deficiency 11/11/2010  . Obesity 07/14/2010  . Mitral valve prolapse 07/14/2010  . HYPERLIPIDEMIA 10/01/2007  . OBSTRUCTIVE SLEEP APNEA 10/01/2007     Prior to Admission medications   Medication Sig Start Date End Date Taking? Authorizing Provider  atenolol (TENORMIN) 50 MG tablet TAKE 1 TABLET BY MOUTH EVERY DAY 06/23/15  Yes Elby Showers, MD  cholecalciferol (VITAMIN D) 1000 units tablet Take 1,000 Units by mouth daily.   Yes Historical Provider, MD  clobetasol cream (TEMOVATE) 0.05 %  04/07/14  Yes Historical Provider, MD  co-enzyme Q-10 50 MG capsule Take 50 mg by mouth 2 (two) times daily.     Yes Historical Provider, MD  desoximetasone (TOPICORT) 0.25 % cream  09/08/12  Yes Historical Provider, MD  DULoxetine (CYMBALTA) 30 MG capsule TAKE ONE  CAPSULE BY MOUTH EVERY DAY 01/01/15  Yes Elby Showers, MD  DULoxetine (CYMBALTA) 60 MG capsule TAKE ONE CAPSULE BY MOUTH EVERY DAY 08/18/15  Yes Elby Showers, MD  Flaxseed, Linseed, (EQL FLAX SEED OIL) 1000 MG CAPS Take by mouth. Reported on 03/21/2015   Yes Historical Provider, MD  fluticasone (CUTIVATE) 0.005 % ointment  09/08/12  Yes Historical Provider, MD  ketoconazole (NIZORAL) 2 % cream Apply topically daily. 12/07/13  Yes Elby Showers, MD  losartan (COZAAR) 50 MG tablet TAKE 1 TABLET BY MOUTH EVERY DAY 05/14/15  Yes Elby Showers, MD  Omega-3 Fatty Acids (FISH OIL) 1000 MG CAPS Take by mouth.   Yes Historical Provider, MD  simvastatin (ZOCOR) 20 MG tablet TAKE 1 TABLET BY MOUTH EVERY DAY 06/23/15  Yes Elby Showers, MD  triamcinolone ointment (KENALOG) 0.1 %  04/02/14  Yes Historical Provider, MD  TURMERIC PO Take by mouth.     Yes Historical Provider, MD  VOLTAREN 1 % GEL  11/05/12  Yes Historical Provider, MD     Allergies  Allergen Reactions  . Sulfa Antibiotics Hives  . Codeine Itching  . Morphine     REACTION: itch  . Sulfonamide Derivatives     REACTION: itch       Objective:  Physical Exam  Constitutional: She is oriented to person, place, and time. She appears well-developed and well-nourished.  She is active and cooperative. No distress.  BP 124/72 (BP Location: Right Arm, Patient Position: Sitting, Cuff Size: Normal)   Pulse 63   Temp 98.2 F (36.8 C) (Oral)   Resp 16   Ht 5\' 6"  (1.676 m)   Wt 182 lb 9.6 oz (82.8 kg)   SpO2 98%   BMI 29.47 kg/m    Eyes: Conjunctivae are normal.  Pulmonary/Chest: Effort normal.  Musculoskeletal:       Right knee: She exhibits normal range of motion and no swelling. No tenderness found.       Left knee: She exhibits decreased range of motion (unable to fully extend, due to pain). She exhibits no swelling, no effusion, no ecchymosis, no deformity, no laceration, no erythema, normal alignment, no LCL laxity and normal patellar  mobility. Tenderness found. Medial joint line and lateral joint line tenderness noted. No patellar tendon tenderness noted.  Neurological: She is alert and oriented to person, place, and time. She has normal strength. No sensory deficit.  Psychiatric: She has a normal mood and affect. Her speech is normal and behavior is normal.           Assessment & Plan:   1. Acute pain of left knee Possible meniscal tear. She has a walker and cane at home, continue ibuprofen with food. Proceed to visit with Dr. Tonita Cong on 10/09 for additional evaluation.  2. Need for prophylactic vaccination and inoculation against influenza - Flu Vaccine QUAD 36+ mos IM   Fara Chute, PA-C Physician Assistant-Certified [facility]

## 2015-10-20 NOTE — Progress Notes (Signed)
Subjective:    Patient ID: [name], female    DOB: [birth year], 70 y.o.   MRN: [id number] Chief Complaint  Patient presents with  . Knee Pain    chronic knee pain, sees Dr. Georgana Curio at [facility], has appt with [place] Monday at 1pm   . Knee Pain    hit left foot this morning on curve at work, and pain has gotten increasely worse    HPI Patient is a 70 yo female with a hx of osteoarthritis and left knee DJD, who presents today with left knee pain x 4 hours. 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 She reports that as she tripped she adjusted her footing so she wouldn't fall. After adjusting her footing she felt immediate sever pain in her left knee. As she made her way to her car she took 600mg  of Ibuprofen; which provided little relief. Patient reports pain with knee extension and weight bearing, and relief with sitting and rest. Patient denies swelling, erythema, joint instability, or crepitus.   Patient Active Problem List   Diagnosis Date Noted  . Left knee DJD 01/24/2012  . Decreased libido 11/05/2011  . Osteoarthritis of left knee 11/11/2010  . Impaired glucose tolerance 11/11/2010  . Vitamin D deficiency 11/11/2010  . Obesity 07/14/2010  . Mitral valve prolapse 07/14/2010  . HYPERLIPIDEMIA 10/01/2007  . OBSTRUCTIVE SLEEP APNEA 10/01/2007   Past Medical History:  Diagnosis Date  . Arthritis   . Genital HSV 03/2013   Positive HSV 1 genital PCR  . Heart murmur   . Hyperlipidemia   . Hypertension   . Mitral valve prolapse    since age 61  . Overactive bladder   . Ringing in ears 2010   interfers with clarity of hearing at times  . Sleep apnea    mild -never tolerated cpap  . Stress incontinence in female    wears a pad   Family History  Problem Relation Age of Onset  . Hypertension Mother   . Heart disease Mother   . Kidney failure Mother   . Other Mother     sclaraderma  .  Diabetes Maternal Grandmother     TYPE 2  . Hypertension Maternal Grandmother   . Other Maternal Grandmother     abdominal anersym  . Heart attack Father    Social History   Social History  . Marital status: Widowed    Spouse name: N/A  . Number of children: 2  . Years of education: Clg, RN   Occupational History  . Not on file.   Social History Main Topics  . Smoking status: Never Smoker  . Smokeless tobacco: Never Used  . Alcohol use 0.0 oz/week     Comment: wine socially  . Drug use: No  . Sexual activity: Not Currently    Birth control/ protection: Post-menopausal     Comment: 1st intercourse 59 yo-2 partners   Other Topics Concern  . Not on file   Social History Narrative   Drinks about 2 cups of coffee a day, 1 glass of tea a day    Current Outpatient Prescriptions on File Prior to Visit  Medication Sig Dispense Refill  . atenolol (TENORMIN) 50 MG tablet TAKE 1 TABLET BY MOUTH EVERY DAY 90 tablet 1  . clobetasol cream (TEMOVATE) 0.05 %     . co-enzyme Q-10 50 MG capsule Take 50 mg by mouth  2 (two) times daily.      Marland Kitchen desoximetasone (TOPICORT) 0.25 % cream     . DULoxetine (CYMBALTA) 30 MG capsule TAKE ONE CAPSULE BY MOUTH EVERY DAY 30 capsule 11  . DULoxetine (CYMBALTA) 60 MG capsule TAKE ONE CAPSULE BY MOUTH EVERY DAY 30 capsule 5  . Flaxseed, Linseed, (EQL FLAX SEED OIL) 1000 MG CAPS Take by mouth. Reported on 03/21/2015    . fluticasone (CUTIVATE) 0.005 % ointment     . ketoconazole (NIZORAL) 2 % cream Apply topically daily. 60 g 5  . losartan (COZAAR) 50 MG tablet TAKE 1 TABLET BY MOUTH EVERY DAY 30 tablet 5  . simvastatin (ZOCOR) 20 MG tablet TAKE 1 TABLET BY MOUTH EVERY DAY 90 tablet 1  . triamcinolone ointment (KENALOG) 0.1 %     . TURMERIC PO Take by mouth.      . VOLTAREN 1 % GEL      Current Facility-Administered Medications on File Prior to Visit  Medication Dose Route Frequency Provider Last Rate Last Dose  . clotrimazole-betamethasone (LOTRISONE)  cream   Topical BID Elby Showers, MD        Review of Systems  Constitutional: Negative for chills, diaphoresis, fatigue and fever.  HENT: Negative for ear pain, rhinorrhea, sore throat and tinnitus.   Respiratory: Negative for cough, chest tightness and shortness of breath.   Cardiovascular: Negative for chest pain, palpitations and leg swelling.  Gastrointestinal: Negative for diarrhea, nausea and vomiting.  Endocrine: Negative for polyuria.  Genitourinary: Negative for dysuria, frequency and urgency.  Musculoskeletal: Positive for arthralgias (Left knee pain ). Negative for joint swelling.  Skin: Negative for rash.  Neurological: Negative for dizziness, weakness, light-headedness and headaches.  Psychiatric/Behavioral: Negative for agitation and behavioral problems.       Objective:   Physical Exam  Constitutional: She appears well-developed and well-nourished. No distress.  HENT:  Head: Normocephalic and atraumatic.  Right Ear: External ear normal.  Left Ear: External ear normal.  Mouth/Throat: No oropharyngeal exudate.  Eyes: Pupils are equal, round, and reactive to light. Right eye exhibits no discharge. Left eye exhibits no discharge.  Neck: Neck supple. No thyromegaly present.  Cardiovascular: Normal rate, regular rhythm, normal heart sounds and intact distal pulses.  Exam reveals no gallop and no friction rub.   No murmur heard. Pulmonary/Chest: Effort normal and breath sounds normal. No respiratory distress. She has no wheezes.  Abdominal: Soft. She exhibits no distension. There is no tenderness.  Musculoskeletal: She exhibits tenderness (Tenderness to palpation around medial and lateral border of proximal tibia ). She exhibits no edema or deformity.       Left knee: Tenderness found.  Decreased ROM of left knee with extension   Lymphadenopathy:    She has no cervical adenopathy.  Neurological: She is alert. She displays normal reflexes.  Skin: Skin is warm and dry.  No rash noted. She is not diaphoretic.  Psychiatric: She has a normal mood and affect. Her behavior is normal.       Assessment & Plan:  1. Acute pain of left knee Most likely soft tissue injury due to jarring of knee. OTC treatment with NSAID, ice and rest  2. Need for prophylactic vaccination and inoculation against influenza  - Flu Vaccine QUAD 36+ mos IM

## 2015-10-20 NOTE — Patient Instructions (Addendum)
Use a cane or walker to limit the weight on the LEFT knee.  Use the ibuprofen that you have. Apply ice tonight and tomorrow. Proceed with the visit with Dr. Tonita Cong on Monday.    IF you received an x-ray today, you will receive an invoice from [facility]. Please contact [facility] at [phone] with questions or concerns regarding your invoice.   IF you received labwork today, you will receive an invoice from [employer]. Please contact [facility] at [phone] with questions or concerns regarding your invoice.   Our billing staff will not be able to assist you with questions regarding bills from these companies.  You will be contacted with the lab results as soon as they are available. The fastest way to get your results is to activate your My Chart account. Instructions are located on the last page of this paperwork. If you have not heard from [place] regarding the results in 2 weeks, please contact this office.

## 2015-10-23 DIAGNOSIS — M1712 Unilateral primary osteoarthritis, left knee: Secondary | ICD-10-CM | POA: Diagnosis not present

## 2015-10-27 DIAGNOSIS — H5203 Hypermetropia, bilateral: Secondary | ICD-10-CM | POA: Diagnosis not present

## 2015-10-27 DIAGNOSIS — H524 Presbyopia: Secondary | ICD-10-CM | POA: Diagnosis not present

## 2015-10-27 DIAGNOSIS — H2513 Age-related nuclear cataract, bilateral: Secondary | ICD-10-CM | POA: Diagnosis not present

## 2015-11-12 ENCOUNTER — Other Ambulatory Visit: Payer: Self-pay | Admitting: Internal Medicine

## 2015-11-15 DIAGNOSIS — M1712 Unilateral primary osteoarthritis, left knee: Secondary | ICD-10-CM | POA: Diagnosis not present

## 2015-11-16 ENCOUNTER — Encounter: Payer: Self-pay | Admitting: Internal Medicine

## 2015-12-05 ENCOUNTER — Ambulatory Visit: Payer: Self-pay | Admitting: Orthopedic Surgery

## 2015-12-11 ENCOUNTER — Other Ambulatory Visit: Payer: Self-pay | Admitting: Internal Medicine

## 2015-12-17 ENCOUNTER — Other Ambulatory Visit: Payer: Self-pay | Admitting: Internal Medicine

## 2015-12-25 DIAGNOSIS — M1712 Unilateral primary osteoarthritis, left knee: Secondary | ICD-10-CM | POA: Diagnosis not present

## 2016-01-01 ENCOUNTER — Ambulatory Visit: Payer: Self-pay | Admitting: Orthopedic Surgery

## 2016-01-01 DIAGNOSIS — M24661 Ankylosis, right knee: Secondary | ICD-10-CM | POA: Diagnosis not present

## 2016-01-01 DIAGNOSIS — M1712 Unilateral primary osteoarthritis, left knee: Secondary | ICD-10-CM | POA: Diagnosis not present

## 2016-01-01 NOTE — H&P (Signed)
[name] DOB: October 09, 1945 Married / Language: [name] / Race: White Female  H&P Date: 01/01/16  Chief Complaint: Left knee pain; Right knee stiffness  History of Present Illness The patient is a 70 year old female who comes in today for a preoperative History and Physical. The patient is scheduled for a left total knee arthroplasty to be performed by Dr. Johnn Hai, MD at [facility] on 01/25/2016. [name] reports chronic pain for years in her left knee which are refractory to conservative tx including steroid injections, viscosupplementation, NSAIDs, pain medications, activity modifications, quad strengthening and relative rest. Her pain is interfering with ADLs and quality of life at this point and she desires to proceed with surgery. She also is c/o stiffness in the right knee, 5 years s/p TKA. She states she never regained full ROM post-op and is wondering if anything can be done for the right knee while she is under anesthesia for the left. The right knee causes difficulty getting in and out of the car, out of a chair, and with steps.  Dr. Tonita Cong and the patient mutually agreed to proceed with a total knee replacement. Risks and benefits of the procedure were discussed including stiffness, suboptimal range of motion, persistent pain, infection requiring removal of prosthesis and reinsertion, need for prophylactic antibiotics in the future, for example, dental procedures, possible need for manipulation, revision in the future and also anesthetic complications including DVT, PE, etc. We discussed the perioperative course, time in the hospital, postoperative recovery and the need for elevation to control swelling. We also discussed the predicted range of motion and the probability that squatting and kneeling would be unobtainable in the future. In addition, postoperative anticoagulation was discussed. We have obtained preoperative medical clearance as necessary. Provided her  illustrated handout and discussed it in detail. They will enroll in the total joint replacement educational forum at the hospital.  She is not yet scheduled for her pre-op with [facility] hospital. She has been cleared by Dr. Renold Genta. She had to stop taking ibuprofen previously for bleeding in her eye. She did have Xarelto s/p her R TKA 5 years ago. She is requesting a CPM as it was helpful last time. She went to [facility] for rehab last time and is planning to go to [facility] this time. She lives alone.  Problem List/Past Medical Hx  Primary osteoarthritis of first carpometacarpal joint of left hand (M18.12)  Foot pain (M79.673)  Trigger finger, left middle finger (M65.332)  Sleep Apnea  uses CPAP Oophorectomy  right Osteoarthritis  Heart murmur  MVP Hypercholesterolemia  Tinnitus  Dentures  2 partial Bronchitis  in past Urinary Incontinence  stress incontinence  Allergies  Mobic *ANALGESICS - ANTI-INFLAMMATORY*  Morphine Sulfate (Concentrate) *ANALGESICS - OPIOID*  SULFA [12/02/2001]: VERIFY WITH PATIENT NSAID INTOLERANCE [04/06/2007]: Allergies Reconciled   Family History  Liver Disease, Chronic  mother Kidney disease  mother Congestive Heart Failure  mother First Degree Relatives  Heart disease in female family member before age 71  Heart Disease  mother Hypertension  mother and grandmother mothers side- aneurysm Drug / Alcohol Addiction  mother Diabetes Mellitus  grandmother mothers side Other medical problems  DAUGHTER WITH KIDNEY STONES Father  MI x 5 Mother  kidney and heart failure, autoimmune disorder  Social History  Tobacco use  Never smoker. never smoker  Medication History Voltaren (1% Gel, 4 gram(s) Transdermal four times daily, Taken starting 10/09/2015) Active. (5 100g tubes- jcb/jmb/acw) Fish Oil (Oral) Specific strength unknown -  Active. Losartan Potassium (50MG  Tablet, Oral) Active. Flaxseed Oil Specific strength  unknown - Active. Atenolol (50MG  Tablet, Oral) Active. CoQ10 (Oral) Specific strength unknown - Active. Cymbalta (Oral) Specific strength unknown - Active. Zocor (Oral) Specific strength unknown - Active. Alpha Lipoic Acid (Oral) Specific strength unknown - Active. Vitamin D3 (2000UNIT Tablet, Oral) Active. Acetyl L-Carnitine (Oral) Specific strength unknown - Active. Medications Reconciled  Past Surgical History Breast Biopsy  right Arthroscopy of Knee  bilateral Breast Mass; Local Excision  right Foot Surgery  left Tubal Ligation  Spinal Surgery  Other Surgery  THUMB BONE SPUR Tonsillectomy  Total Knee Replacement  right, 2012, Dr. Tonita Cong, general anesthesia Ovary Removal - Right   Review of Systems General Not Present- Chills, Fatigue, Fever, Memory Loss, Night Sweats, Weight Gain and Weight Loss. Skin Not Present- Eczema, Hives, Itching, Lesions and Rash. HEENT Present- Dentures and Tinnitus. Not Present- Double Vision, Headache, Hearing Loss and Visual Loss. Respiratory Not Present- Allergies, Chronic Cough, Coughing up blood, Shortness of breath at rest and Shortness of breath with exertion. Cardiovascular Present- Murmur. Not Present- Chest Pain, Difficulty Breathing Lying Down, Palpitations, Racing/skipping heartbeats and Swelling. Gastrointestinal Not Present- Abdominal Pain, Bloody Stool, Constipation, Diarrhea, Difficulty Swallowing, Heartburn, Jaundice, Loss of appetitie, Nausea and Vomiting. Female Genitourinary Present- Incontinence (stress) and Urinating at Night. Not Present- Blood in Urine, Discharge, Flank Pain, Painful Urination, Urgency, Urinary frequency, Urinary Retention and Weak urinary stream. Musculoskeletal Present- Back Pain, Joint Pain, Muscle Pain and Spasms. Not Present- Joint Swelling, Morning Stiffness and Muscle Weakness. Neurological Not Present- Blackout spells, Difficulty with balance, Dizziness, Paralysis, Tremor and  Weakness. Psychiatric Not Present- Insomnia.  Physical Exam General Mental Status -Alert, cooperative and good historian. General Appearance-pleasant, Not in acute distress. Orientation-Oriented X3. Build & Nutrition-Well nourished and Well developed.  Head and Neck Head-normocephalic, atraumatic . Neck Global Assessment - supple, no bruit auscultated on the right, no bruit auscultated on the left.  Eye Pupil - Bilateral-Regular and Round. Motion - Bilateral-EOMI.  Chest and Lung Exam Auscultation Breath sounds - clear at anterior chest wall and clear at posterior chest wall. Adventitious sounds - No Adventitious sounds.  Cardiovascular Auscultation Rhythm - Regular rate and rhythm. Heart Sounds - S1 WNL and S2 WNL. Murmurs & Other Heart Sounds - Auscultation of the heart reveals - No Murmurs.  Abdomen Palpation/Percussion Tenderness - Abdomen is non-tender to palpation. Rigidity (guarding) - Abdomen is soft. Auscultation Auscultation of the abdomen reveals - Bowel sounds normal.  Female Genitourinary Not done, not pertinent to present illness  Musculoskeletal On exam, she is well-nourished, well-developed, awake, alert, and oriented x3, in mild distress due to pain, has an antalgic gait. Left knee, mild effusion. Tender to palpation medial joint line diffusely in the posterior aspect of the knee and positive patellofemoral pain. Nontender lateral joint space, patellar tendon, quad tendon, fibular head, peroneal nerve, range of motion approximately 0 to 90 degrees. Positive patellofemoral crepitus.  Right knee anterior midline incision from TKA well-healed with no sign of infection. ROM approx -5-70 degrees. No calf pain or sign of DVT. Nontender. No instability noted. No pain or laxity with varus/valgus stress.  Imaging Prior x-rays reviewed today by Dr. Tonita Cong, bone-on-bone patellofemoral space, moderate narrowing medial joint space, periarticular osteophytes  noted. Right knee xrays with TKA in excellent aligment with no sign of osteolysis or loosening. No fx, subluxation, dislocation, lytic or blastic lesions.  Assessment & Plan 1. Unilateral primary osteoarthritis, left knee (M17.12) 2. Arthrofibrosis of knee joint, right (M24.661)  Pt with end-stage Left knee DJD, bone-on-bone, refractory to conservative tx, scheduled for Left total knee replacement by Dr. Tonita Cong, will also add EUA/MUA right knee for arthrofibrosis to the procedure due to her ongoing stiffness. We again discussed the procedure itself as well as risks, complications and alternatives, including but not limited to DVT, PE, infx, bleeding, failure of procedure, need for secondary procedure including manipulation, nerve injury, ongoing pain/symptoms, anesthesia risk, even stroke or death. Also discussed typical post-op protocols, activity restrictions, need for PT, flexion/extension exercises, time out of work. Discussed need for DVT ppx post-op with Xarelto then ASA per protocol. Discussed dental ppx. Also discussed limitations post-operatively such as kneeling and squatting. All questions were answered. Patient desires to proceed with surgery as scheduled. Will hold ASA, supplements and NSAIDs accordingly. Will remain NPO after MN night before surgery. Will present to [facility] for pre-op testing. Plan ASA 325mg  BID 2 weeks post-op for DVT ppx then ASA 325mg  daily. Plan Norco, Robaxin, Colace. Plan rehab initially post-op to [facility] as she lives alone, then home with HHPT vs. directly to outpt PT. Will follow up 10-14 days post-op for suture removal and xrays.  Signed electronically by Cecilie Kicks, PA-C for Dr. Tonita Cong

## 2016-01-18 NOTE — Patient Instructions (Addendum)
[name]  01/18/2016   Your procedure is scheduled on: 01-25-16  Report to [facility] Main  Entrance take [facility]  elevators to 3rd floor to  [place] at  [place] AM.  Call this number if you have problems the morning of surgery [phone]   Remember: ONLY 1 PERSON MAY GO WITH YOU TO SHORT STAY TO GET  READY MORNING OF YOUR SURGERY.  Do not eat food or drink liquids :After Midnight.     Take these medicines the morning of surgery with A SIP OF WATER: Atenolol. Duloxetine.                               You may not have any metal on your body including hair pins and              piercings  Do not wear jewelry, make-up, lotions, powders or perfumes, deodorant             Do not wear nail polish.  Do not shave  48 hours prior to surgery.              Men may shave face and neck.   Do not bring valuables to the hospital. [place].  Contacts, dentures or bridgework may not be worn into surgery.  Leave suitcase in the car. After surgery it may be brought to your room.              Please read over the following fact sheets you were given: _____________________________________________________________________             [facility] - Preparing for Surgery Before surgery, you can play an important role.  Because skin is not sterile, your skin needs to be as free of germs as possible.  You can reduce the number of germs on your skin by washing with CHG (chlorahexidine gluconate) soap before surgery.  CHG is an antiseptic cleaner which kills germs and bonds with the skin to continue killing germs even after washing. Please DO NOT use if you have an allergy to CHG or antibacterial soaps.  If your skin becomes reddened/irritated stop using the CHG and inform your nurse when you arrive at Short Stay. Do not shave (including legs and underarms) for at least 48 hours prior to the first CHG shower.  You may shave  your face/neck. Please follow these instructions carefully:  1.  Shower with CHG Soap the night before surgery and the  morning of Surgery.  2.  If you choose to wash your hair, wash your hair first as usual with your  normal  shampoo.  3.  After you shampoo, rinse your hair and body thoroughly to remove the  shampoo.                           4.  Use CHG as you would any other liquid soap.  You can apply chg directly  to the skin and wash                       Gently with a scrungie or clean washcloth.  5.  Apply the CHG Soap to your body  ONLY FROM THE NECK DOWN.   Do not use on face/ open                           Wound or open sores. Avoid contact with eyes, ears mouth and genitals (private parts).                       Wash face,  Genitals (private parts) with your normal soap.             6.  Wash thoroughly, paying special attention to the area where your surgery  will be performed.  7.  Thoroughly rinse your body with warm water from the neck down.  8.  DO NOT shower/wash with your normal soap after using and rinsing off  the CHG Soap.                9.  Pat yourself dry with a clean towel.            10.  Wear clean pajamas.            11.  Place clean sheets on your bed the night of your first shower and do not  sleep with pets. Day of Surgery : Do not apply any lotions/deodorants the morning of surgery.  Please wear clean clothes to the hospital/surgery center.  FAILURE TO FOLLOW THESE INSTRUCTIONS MAY RESULT IN THE CANCELLATION OF YOUR SURGERY PATIENT SIGNATURE_________________________________  NURSE SIGNATURE__________________________________  ________________________________________________________________________   Adam Phenix  An incentive spirometer is a tool that can help keep your lungs clear and active. This tool measures how well you are filling your lungs with each breath. Taking long deep breaths may help reverse or decrease the chance of developing  breathing (pulmonary) problems (especially infection) following:  A long period of time when you are unable to move or be active. BEFORE THE PROCEDURE   If the spirometer includes an indicator to show your best effort, your nurse or respiratory therapist will set it to a desired goal.  If possible, sit up straight or lean slightly forward. Try not to slouch.  Hold the incentive spirometer in an upright position. INSTRUCTIONS FOR USE  1. Sit on the edge of your bed if possible, or sit up as far as you can in bed or on a chair. 2. Hold the incentive spirometer in an upright position. 3. Breathe out normally. 4. Place the mouthpiece in your mouth and seal your lips tightly around it. 5. Breathe in slowly and as deeply as possible, raising the piston or the ball toward the top of the column. 6. Hold your breath for 3-5 seconds or for as long as possible. Allow the piston or ball to fall to the bottom of the column. 7. Remove the mouthpiece from your mouth and breathe out normally. 8. Rest for a few seconds and repeat Steps 1 through 7 at least 10 times every 1-2 hours when you are awake. Take your time and take a few normal breaths between deep breaths. 9. The spirometer may include an indicator to show your best effort. Use the indicator as a goal to work toward during each repetition. 10. After each set of 10 deep breaths, practice coughing to be sure your lungs are clear. If you have an incision (the cut made at the time of surgery), support your incision when coughing by placing a pillow or rolled up towels firmly against it. Once  you are able to get out of bed, walk around indoors and cough well. You may stop using the incentive spirometer when instructed by your caregiver.  RISKS AND COMPLICATIONS  Take your time so you do not get dizzy or light-headed.  If you are in pain, you may need to take or ask for pain medication before doing incentive spirometry. It is harder to take a deep  breath if you are having pain. AFTER USE  Rest and breathe slowly and easily.  It can be helpful to keep track of a log of your progress. Your caregiver can provide you with a simple table to help with this. If you are using the spirometer at home, follow these instructions: [place] IF:   You are having difficultly using the spirometer.  You have trouble using the spirometer as often as instructed.  Your pain medication is not giving enough relief while using the spirometer.  You develop fever of 100.5 F (38.1 C) or higher. SEEK IMMEDIATE MEDICAL CARE IF:   You cough up bloody sputum that had not been present before.  You develop fever of 102 F (38.9 C) or greater.  You develop worsening pain at or near the incision site. MAKE SURE YOU:   Understand these instructions.  Will watch your condition.  Will get help right away if you are not doing well or get worse. Document Released: 05/13/2006 Document Revised: 03/25/2011 Document Reviewed: 07/14/2006 [facility] Patient Information 2014 [place], [place].   ________________________________________________________________________

## 2016-01-19 ENCOUNTER — Encounter (HOSPITAL_COMMUNITY)
Admission: RE | Admit: 2016-01-19 | Discharge: 2016-01-19 | Disposition: A | Discharge status: Home / Self Care | Payer: Medicare Other | Source: Ambulatory Visit | Attending: Specialist | Admitting: Specialist

## 2016-01-19 ENCOUNTER — Encounter (INDEPENDENT_AMBULATORY_CARE_PROVIDER_SITE_OTHER): Payer: Self-pay

## 2016-01-19 ENCOUNTER — Encounter (HOSPITAL_COMMUNITY): Payer: Self-pay

## 2016-01-19 DIAGNOSIS — Z8249 Family history of ischemic heart disease and other diseases of the circulatory system: Secondary | ICD-10-CM | POA: Diagnosis not present

## 2016-01-19 DIAGNOSIS — M79673 Pain in unspecified foot: Secondary | ICD-10-CM | POA: Insufficient documentation

## 2016-01-19 DIAGNOSIS — R011 Cardiac murmur, unspecified: Secondary | ICD-10-CM | POA: Diagnosis not present

## 2016-01-19 DIAGNOSIS — M1712 Unilateral primary osteoarthritis, left knee: Secondary | ICD-10-CM | POA: Diagnosis not present

## 2016-01-19 DIAGNOSIS — Z01812 Encounter for preprocedural laboratory examination: Secondary | ICD-10-CM | POA: Diagnosis not present

## 2016-01-19 DIAGNOSIS — M25661 Stiffness of right knee, not elsewhere classified: Secondary | ICD-10-CM | POA: Diagnosis not present

## 2016-01-19 DIAGNOSIS — Z79899 Other long term (current) drug therapy: Secondary | ICD-10-CM | POA: Insufficient documentation

## 2016-01-19 LAB — URINALYSIS, ROUTINE W REFLEX MICROSCOPIC
Bilirubin Urine: NEGATIVE
Glucose, UA: NEGATIVE mg/dL
Hgb urine dipstick: NEGATIVE
KETONES UR: NEGATIVE mg/dL
Nitrite: NEGATIVE
PROTEIN: NEGATIVE mg/dL
Specific Gravity, Urine: 1.019 (ref 1.005–1.030)
pH: 6 (ref 5.0–8.0)

## 2016-01-19 LAB — SURGICAL PCR SCREEN
MRSA, PCR: NEGATIVE
STAPHYLOCOCCUS AUREUS: NEGATIVE

## 2016-01-19 LAB — PROTIME-INR
INR: 0.97
Prothrombin Time: 12.9 seconds (ref 11.4–15.2)

## 2016-01-19 LAB — BASIC METABOLIC PANEL
Anion gap: 7 (ref 5–15)
BUN: 20 mg/dL (ref 6–20)
CO2: 27 mmol/L (ref 22–32)
Calcium: 9.3 mg/dL (ref 8.9–10.3)
Chloride: 107 mmol/L (ref 101–111)
Creatinine, Ser: 0.88 mg/dL (ref 0.44–1.00)
Glucose, Bld: 101 mg/dL — ABNORMAL HIGH (ref 65–99)
POTASSIUM: 4.6 mmol/L (ref 3.5–5.1)
SODIUM: 141 mmol/L (ref 135–145)

## 2016-01-19 LAB — CBC
HEMATOCRIT: 40.1 % (ref 36.0–46.0)
Hemoglobin: 13.4 g/dL (ref 12.0–15.0)
MCH: 29.6 pg (ref 26.0–34.0)
MCHC: 33.4 g/dL (ref 30.0–36.0)
MCV: 88.5 fL (ref 78.0–100.0)
PLATELETS: 217 10*3/uL (ref 150–400)
RBC: 4.53 MIL/uL (ref 3.87–5.11)
RDW: 12.7 % (ref 11.5–15.5)
WBC: 6.2 10*3/uL (ref 4.0–10.5)

## 2016-01-19 LAB — APTT: APTT: 30 s (ref 24–36)

## 2016-01-19 NOTE — Pre-Procedure Instructions (Signed)
EKG 03-21-15 epic CXR 03-21-15 epic

## 2016-01-25 ENCOUNTER — Encounter (HOSPITAL_COMMUNITY)
Admission: RE | Disposition: A | Discharge status: Skilled Nursing Facility | Payer: Self-pay | Source: Ambulatory Visit | Attending: Specialist

## 2016-01-25 ENCOUNTER — Inpatient Hospital Stay (HOSPITAL_COMMUNITY)
Admission: RE | Admit: 2016-01-25 | Discharge: 2016-01-27 | DRG: 470 | Disposition: A | Discharge status: Skilled Nursing Facility | Payer: Medicare Other | Source: Ambulatory Visit | Attending: Specialist | Admitting: Specialist

## 2016-01-25 ENCOUNTER — Encounter (HOSPITAL_COMMUNITY): Payer: Self-pay | Admitting: *Deleted

## 2016-01-25 ENCOUNTER — Inpatient Hospital Stay (HOSPITAL_COMMUNITY): Payer: Medicare Other | Admitting: Certified Registered Nurse Anesthetist

## 2016-01-25 ENCOUNTER — Inpatient Hospital Stay (HOSPITAL_COMMUNITY): Payer: Medicare Other

## 2016-01-25 DIAGNOSIS — M1990 Unspecified osteoarthritis, unspecified site: Secondary | ICD-10-CM | POA: Diagnosis not present

## 2016-01-25 DIAGNOSIS — Z4789 Encounter for other orthopedic aftercare: Secondary | ICD-10-CM | POA: Diagnosis not present

## 2016-01-25 DIAGNOSIS — N393 Stress incontinence (female) (male): Secondary | ICD-10-CM | POA: Diagnosis present

## 2016-01-25 DIAGNOSIS — Z96652 Presence of left artificial knee joint: Secondary | ICD-10-CM | POA: Diagnosis not present

## 2016-01-25 DIAGNOSIS — R2689 Other abnormalities of gait and mobility: Secondary | ICD-10-CM | POA: Diagnosis not present

## 2016-01-25 DIAGNOSIS — M1712 Unilateral primary osteoarthritis, left knee: Principal | ICD-10-CM | POA: Diagnosis present

## 2016-01-25 DIAGNOSIS — Z471 Aftercare following joint replacement surgery: Secondary | ICD-10-CM | POA: Diagnosis not present

## 2016-01-25 DIAGNOSIS — M6281 Muscle weakness (generalized): Secondary | ICD-10-CM | POA: Diagnosis not present

## 2016-01-25 DIAGNOSIS — M24661 Ankylosis, right knee: Secondary | ICD-10-CM | POA: Diagnosis present

## 2016-01-25 DIAGNOSIS — F331 Major depressive disorder, recurrent, moderate: Secondary | ICD-10-CM | POA: Diagnosis not present

## 2016-01-25 DIAGNOSIS — Z79899 Other long term (current) drug therapy: Secondary | ICD-10-CM | POA: Diagnosis not present

## 2016-01-25 DIAGNOSIS — E785 Hyperlipidemia, unspecified: Secondary | ICD-10-CM | POA: Diagnosis present

## 2016-01-25 DIAGNOSIS — Z96651 Presence of right artificial knee joint: Secondary | ICD-10-CM | POA: Diagnosis present

## 2016-01-25 DIAGNOSIS — I341 Nonrheumatic mitral (valve) prolapse: Secondary | ICD-10-CM | POA: Diagnosis present

## 2016-01-25 DIAGNOSIS — I10 Essential (primary) hypertension: Secondary | ICD-10-CM | POA: Diagnosis present

## 2016-01-25 DIAGNOSIS — M25569 Pain in unspecified knee: Secondary | ICD-10-CM | POA: Diagnosis not present

## 2016-01-25 DIAGNOSIS — R531 Weakness: Secondary | ICD-10-CM | POA: Diagnosis not present

## 2016-01-25 DIAGNOSIS — M25562 Pain in left knee: Secondary | ICD-10-CM | POA: Diagnosis not present

## 2016-01-25 DIAGNOSIS — Z96659 Presence of unspecified artificial knee joint: Secondary | ICD-10-CM

## 2016-01-25 HISTORY — PX: TOTAL KNEE ARTHROPLASTY: SHX125

## 2016-01-25 LAB — TYPE AND SCREEN
ABO/RH(D): O POS
ANTIBODY SCREEN: NEGATIVE

## 2016-01-25 SURGERY — ARTHROPLASTY, KNEE, TOTAL
Anesthesia: General | Site: Knee | Laterality: Left

## 2016-01-25 MED ORDER — HYDROMORPHONE HCL 1 MG/ML IJ SOLN
INTRAMUSCULAR | Status: AC
  Filled 2016-01-25: qty 1

## 2016-01-25 MED ORDER — ACETAMINOPHEN 325 MG PO TABS
650.0000 mg | ORAL_TABLET | Freq: Four times a day (QID) | ORAL | Status: DC | PRN
  Administered 2016-01-25 – 2016-01-26 (×2): 650 mg via ORAL
  Filled 2016-01-25 (×2): qty 2

## 2016-01-25 MED ORDER — LACTATED RINGERS IV SOLN
INTRAVENOUS | Status: DC
  Administered 2016-01-25 (×2): via INTRAVENOUS

## 2016-01-25 MED ORDER — PROPOFOL 10 MG/ML IV BOLUS
INTRAVENOUS | Status: AC
  Filled 2016-01-25: qty 20

## 2016-01-25 MED ORDER — FLUTICASONE PROPIONATE 0.005 % EX OINT
1.0000 "application " | TOPICAL_OINTMENT | Freq: Two times a day (BID) | CUTANEOUS | Status: DC | PRN
  Filled 2016-01-25: qty 30

## 2016-01-25 MED ORDER — FENTANYL CITRATE (PF) 100 MCG/2ML IJ SOLN
INTRAMUSCULAR | Status: DC | PRN
  Administered 2016-01-25 (×4): 50 ug via INTRAVENOUS

## 2016-01-25 MED ORDER — LIDOCAINE-EPINEPHRINE 2 %-1:100000 IJ SOLN
INTRAMUSCULAR | Status: DC | PRN
  Administered 2016-01-25: 20 mL

## 2016-01-25 MED ORDER — ASPIRIN EC 325 MG PO TBEC
325.0000 mg | DELAYED_RELEASE_TABLET | Freq: Two times a day (BID) | ORAL | Status: DC
  Administered 2016-01-26 – 2016-01-27 (×2): 325 mg via ORAL
  Filled 2016-01-25 (×3): qty 1

## 2016-01-25 MED ORDER — DULOXETINE HCL 30 MG PO CPEP
30.0000 mg | ORAL_CAPSULE | Freq: Every day | ORAL | Status: DC
  Administered 2016-01-26 – 2016-01-27 (×2): 30 mg via ORAL
  Filled 2016-01-25 (×2): qty 1

## 2016-01-25 MED ORDER — METHOCARBAMOL 500 MG PO TABS
500.0000 mg | ORAL_TABLET | Freq: Four times a day (QID) | ORAL | Status: DC | PRN
  Administered 2016-01-26 – 2016-01-27 (×3): 500 mg via ORAL
  Filled 2016-01-25 (×3): qty 1

## 2016-01-25 MED ORDER — SUGAMMADEX SODIUM 200 MG/2ML IV SOLN
INTRAVENOUS | Status: AC
  Filled 2016-01-25: qty 2

## 2016-01-25 MED ORDER — OXYCODONE-ACETAMINOPHEN 5-325 MG PO TABS: 1.0000 | ORAL_TABLET | ORAL | 0 refills | Status: DC | PRN

## 2016-01-25 MED ORDER — ONDANSETRON HCL 4 MG/2ML IJ SOLN: 4.0000 mg | Freq: Four times a day (QID) | INTRAMUSCULAR | Status: DC | PRN

## 2016-01-25 MED ORDER — METHOCARBAMOL 1000 MG/10ML IJ SOLN
500.0000 mg | Freq: Four times a day (QID) | INTRAVENOUS | Status: DC | PRN
  Administered 2016-01-25: 500 mg via INTRAVENOUS
  Filled 2016-01-25: qty 550
  Filled 2016-01-25: qty 5

## 2016-01-25 MED ORDER — PHENOL 1.4 % MT LIQD
1.0000 | OROMUCOSAL | Status: DC | PRN
  Filled 2016-01-25: qty 177

## 2016-01-25 MED ORDER — ACETAMINOPHEN 650 MG RE SUPP
650.0000 mg | Freq: Four times a day (QID) | RECTAL | Status: DC | PRN
Start: 2016-01-25 — End: 2016-01-27
  Filled 2016-01-25: qty 1

## 2016-01-25 MED ORDER — INSULIN ASPART 100 UNIT/ML ~~LOC~~ SOLN: 0.0000 [IU] | Freq: Three times a day (TID) | SUBCUTANEOUS | Status: DC

## 2016-01-25 MED ORDER — POLYETHYLENE GLYCOL 3350 17 G PO PACK
17.0000 g | PACK | Freq: Every day | ORAL | 0 refills | Status: DC
Start: 2016-01-25 — End: 2016-07-15

## 2016-01-25 MED ORDER — HYDROMORPHONE HCL 2 MG/ML IJ SOLN
INTRAMUSCULAR | Status: AC
  Filled 2016-01-25: qty 1

## 2016-01-25 MED ORDER — BUPIVACAINE HCL (PF) 0.25 % IJ SOLN
INTRAMUSCULAR | Status: AC
  Filled 2016-01-25: qty 60

## 2016-01-25 MED ORDER — ATENOLOL 25 MG PO TABS
50.0000 mg | ORAL_TABLET | Freq: Every day | ORAL | Status: DC
  Administered 2016-01-26 – 2016-01-27 (×2): 50 mg via ORAL
  Filled 2016-01-25 (×2): qty 2

## 2016-01-25 MED ORDER — SUCCINYLCHOLINE CHLORIDE 200 MG/10ML IV SOSY
PREFILLED_SYRINGE | INTRAVENOUS | Status: DC | PRN
  Administered 2016-01-25: 120 mg via INTRAVENOUS

## 2016-01-25 MED ORDER — BUPIVACAINE HCL (PF) 0.25 % IJ SOLN
INTRAMUSCULAR | Status: DC | PRN
  Administered 2016-01-25: 20 mL

## 2016-01-25 MED ORDER — ONDANSETRON HCL 4 MG/2ML IJ SOLN
INTRAMUSCULAR | Status: AC
  Filled 2016-01-25: qty 2

## 2016-01-25 MED ORDER — OXYCODONE HCL 5 MG/5ML PO SOLN: 5.0000 mg | Freq: Once | ORAL | Status: DC | PRN

## 2016-01-25 MED ORDER — CLOBETASOL PROPIONATE 0.05 % EX CREA
1.0000 "application " | TOPICAL_CREAM | Freq: Two times a day (BID) | CUTANEOUS | Status: DC | PRN
  Filled 2016-01-25: qty 15

## 2016-01-25 MED ORDER — LIDOCAINE-EPINEPHRINE (PF) 2 %-1:200000 IJ SOLN
INTRAMUSCULAR | Status: AC
  Filled 2016-01-25: qty 20

## 2016-01-25 MED ORDER — SUCCINYLCHOLINE CHLORIDE 200 MG/10ML IV SOSY
PREFILLED_SYRINGE | INTRAVENOUS | Status: AC
  Filled 2016-01-25: qty 10

## 2016-01-25 MED ORDER — LIDOCAINE 2% (20 MG/ML) 5 ML SYRINGE
INTRAMUSCULAR | Status: DC | PRN
  Administered 2016-01-25: 80 mg via INTRAVENOUS

## 2016-01-25 MED ORDER — DEXAMETHASONE SODIUM PHOSPHATE 10 MG/ML IJ SOLN
INTRAMUSCULAR | Status: DC | PRN
  Administered 2016-01-25: 10 mg via INTRAVENOUS

## 2016-01-25 MED ORDER — CEFAZOLIN SODIUM-DEXTROSE 2-4 GM/100ML-% IV SOLN
2.0000 g | INTRAVENOUS | Status: AC
  Administered 2016-01-25: 2 g via INTRAVENOUS

## 2016-01-25 MED ORDER — ASPIRIN EC 325 MG PO TBEC: 325.0000 mg | DELAYED_RELEASE_TABLET | Freq: Two times a day (BID) | ORAL | 1 refills | Status: DC

## 2016-01-25 MED ORDER — MAGNESIUM CITRATE PO SOLN: 1.0000 | Freq: Once | ORAL | Status: DC | PRN

## 2016-01-25 MED ORDER — HYDROMORPHONE HCL 1 MG/ML IJ SOLN
1.0000 mg | INTRAMUSCULAR | Status: DC | PRN
  Administered 2016-01-27: 02:00:00 1 mg via INTRAVENOUS
  Filled 2016-01-25: qty 1

## 2016-01-25 MED ORDER — RISAQUAD PO CAPS
1.0000 | ORAL_CAPSULE | Freq: Every day | ORAL | Status: DC
  Administered 2016-01-25 – 2016-01-27 (×3): 1 via ORAL
  Filled 2016-01-25 (×3): qty 1

## 2016-01-25 MED ORDER — SODIUM CHLORIDE 0.9 % IR SOLN
Status: DC | PRN
  Administered 2016-01-25: 3000 mL

## 2016-01-25 MED ORDER — ALUM & MAG HYDROXIDE-SIMETH 200-200-20 MG/5ML PO SUSP: 30.0000 mL | ORAL | Status: DC | PRN

## 2016-01-25 MED ORDER — METOCLOPRAMIDE HCL 5 MG PO TABS: 5.0000 mg | ORAL_TABLET | Freq: Three times a day (TID) | ORAL | Status: DC | PRN

## 2016-01-25 MED ORDER — LOSARTAN POTASSIUM 50 MG PO TABS
50.0000 mg | ORAL_TABLET | Freq: Every day | ORAL | Status: DC
  Administered 2016-01-27: 50 mg via ORAL
  Filled 2016-01-25: qty 1

## 2016-01-25 MED ORDER — SODIUM CHLORIDE 0.9 % IR SOLN
Status: AC
  Filled 2016-01-25: qty 500000

## 2016-01-25 MED ORDER — PROPOFOL 10 MG/ML IV BOLUS
INTRAVENOUS | Status: DC | PRN
  Administered 2016-01-25: 130 mg via INTRAVENOUS

## 2016-01-25 MED ORDER — HYDROMORPHONE HCL 1 MG/ML IJ SOLN
INTRAMUSCULAR | Status: DC | PRN
  Administered 2016-01-25 (×2): 0.5 mg via INTRAVENOUS

## 2016-01-25 MED ORDER — ONDANSETRON HCL 4 MG/2ML IJ SOLN
INTRAMUSCULAR | Status: DC | PRN
  Administered 2016-01-25: 4 mg via INTRAVENOUS

## 2016-01-25 MED ORDER — OXYCODONE HCL 5 MG PO TABS
5.0000 mg | ORAL_TABLET | ORAL | Status: DC | PRN
  Administered 2016-01-25: 5 mg via ORAL
  Administered 2016-01-26 (×5): 10 mg via ORAL
  Administered 2016-01-26: 01:00:00 5 mg via ORAL
  Administered 2016-01-27 (×2): 10 mg via ORAL
  Filled 2016-01-25 (×4): qty 2
  Filled 2016-01-25: qty 1
  Filled 2016-01-25 (×2): qty 2
  Filled 2016-01-25: qty 1
  Filled 2016-01-25: qty 2

## 2016-01-25 MED ORDER — DOCUSATE SODIUM 100 MG PO CAPS
100.0000 mg | ORAL_CAPSULE | Freq: Two times a day (BID) | ORAL | Status: DC
  Administered 2016-01-25 – 2016-01-27 (×4): 100 mg via ORAL
  Filled 2016-01-25 (×4): qty 1

## 2016-01-25 MED ORDER — DIPHENHYDRAMINE HCL 12.5 MG/5ML PO ELIX
12.5000 mg | ORAL_SOLUTION | ORAL | Status: DC | PRN
  Filled 2016-01-25: qty 10

## 2016-01-25 MED ORDER — FENTANYL CITRATE (PF) 100 MCG/2ML IJ SOLN
INTRAMUSCULAR | Status: AC
  Filled 2016-01-25: qty 2

## 2016-01-25 MED ORDER — DEXAMETHASONE SODIUM PHOSPHATE 10 MG/ML IJ SOLN
INTRAMUSCULAR | Status: AC
  Filled 2016-01-25: qty 1

## 2016-01-25 MED ORDER — DULOXETINE HCL 60 MG PO CPEP
60.0000 mg | ORAL_CAPSULE | Freq: Every day | ORAL | Status: DC
Start: 2016-01-26 — End: 2016-01-27
  Administered 2016-01-26 – 2016-01-27 (×2): 60 mg via ORAL
  Filled 2016-01-25 (×2): qty 1

## 2016-01-25 MED ORDER — ONDANSETRON HCL 4 MG PO TABS
4.0000 mg | ORAL_TABLET | Freq: Four times a day (QID) | ORAL | Status: DC | PRN
Start: 2016-01-25 — End: 2016-01-27
  Filled 2016-01-25: qty 1

## 2016-01-25 MED ORDER — LIDOCAINE 2% (20 MG/ML) 5 ML SYRINGE
INTRAMUSCULAR | Status: AC
  Filled 2016-01-25: qty 5

## 2016-01-25 MED ORDER — LIDOCAINE-EPINEPHRINE (PF) 1 %-1:200000 IJ SOLN
INTRAMUSCULAR | Status: AC
  Filled 2016-01-25: qty 30

## 2016-01-25 MED ORDER — KCL IN DEXTROSE-NACL 20-5-0.45 MEQ/L-%-% IV SOLN
INTRAVENOUS | Status: AC
Start: 2016-01-25 — End: 2016-01-26
  Administered 2016-01-25: 12:00:00 via INTRAVENOUS
  Filled 2016-01-25: qty 1000

## 2016-01-25 MED ORDER — CEFAZOLIN SODIUM-DEXTROSE 2-4 GM/100ML-% IV SOLN
INTRAVENOUS | Status: AC
  Filled 2016-01-25: qty 100

## 2016-01-25 MED ORDER — METOCLOPRAMIDE HCL 5 MG/ML IJ SOLN: 5.0000 mg | Freq: Three times a day (TID) | INTRAMUSCULAR | Status: DC | PRN

## 2016-01-25 MED ORDER — VITAMIN D3 25 MCG (1000 UNIT) PO TABS
1000.0000 [IU] | ORAL_TABLET | Freq: Two times a day (BID) | ORAL | Status: DC
  Administered 2016-01-25 – 2016-01-27 (×4): 1000 [IU] via ORAL
  Filled 2016-01-25 (×4): qty 1

## 2016-01-25 MED ORDER — TRANEXAMIC ACID 1000 MG/10ML IV SOLN
1000.0000 mg | INTRAVENOUS | Status: AC
  Administered 2016-01-25: 1000 mg via INTRAVENOUS
  Filled 2016-01-25: qty 1100

## 2016-01-25 MED ORDER — ROCURONIUM BROMIDE 50 MG/5ML IV SOSY
PREFILLED_SYRINGE | INTRAVENOUS | Status: AC
  Filled 2016-01-25: qty 5

## 2016-01-25 MED ORDER — MENTHOL 3 MG MT LOZG: 1.0000 | LOZENGE | OROMUCOSAL | Status: DC | PRN

## 2016-01-25 MED ORDER — POLYETHYLENE GLYCOL 3350 17 G PO PACK
17.0000 g | PACK | Freq: Every day | ORAL | Status: DC | PRN
Start: 2016-01-25 — End: 2016-01-27
  Administered 2016-01-26: 17 g via ORAL
  Filled 2016-01-25: qty 1

## 2016-01-25 MED ORDER — SODIUM CHLORIDE 0.9 % IR SOLN
Status: DC | PRN
  Administered 2016-01-25: 500 mL

## 2016-01-25 MED ORDER — HYDROMORPHONE HCL 1 MG/ML IJ SOLN
0.2500 mg | INTRAMUSCULAR | Status: DC | PRN
  Administered 2016-01-25 (×4): 0.5 mg via INTRAVENOUS

## 2016-01-25 MED ORDER — MAGNESIUM GLUCONATE 500 MG PO TABS
500.0000 mg | ORAL_TABLET | Freq: Every day | ORAL | Status: DC
Start: 2016-01-25 — End: 2016-01-27
  Administered 2016-01-25 – 2016-01-26 (×2): 500 mg via ORAL
  Filled 2016-01-25 (×2): qty 1

## 2016-01-25 MED ORDER — CEFAZOLIN SODIUM-DEXTROSE 2-4 GM/100ML-% IV SOLN
2.0000 g | Freq: Four times a day (QID) | INTRAVENOUS | Status: AC
  Administered 2016-01-25 – 2016-01-26 (×3): 2 g via INTRAVENOUS
  Filled 2016-01-25 (×2): qty 100

## 2016-01-25 MED ORDER — ROCURONIUM BROMIDE 10 MG/ML (PF) SYRINGE
PREFILLED_SYRINGE | INTRAVENOUS | Status: DC | PRN
  Administered 2016-01-25: 40 mg via INTRAVENOUS

## 2016-01-25 MED ORDER — KCL IN DEXTROSE-NACL 20-5-0.45 MEQ/L-%-% IV SOLN
INTRAVENOUS | Status: AC
  Filled 2016-01-25: qty 1000

## 2016-01-25 MED ORDER — BISACODYL 5 MG PO TBEC: 5.0000 mg | DELAYED_RELEASE_TABLET | Freq: Every day | ORAL | Status: DC | PRN

## 2016-01-25 MED ORDER — OXYCODONE HCL 5 MG PO TABS: 5.0000 mg | ORAL_TABLET | Freq: Once | ORAL | Status: DC | PRN

## 2016-01-25 MED ORDER — DOCUSATE SODIUM 100 MG PO CAPS: 100.0000 mg | ORAL_CAPSULE | Freq: Two times a day (BID) | ORAL | 1 refills | Status: DC | PRN

## 2016-01-25 SURGICAL SUPPLY — 61 items
AGENT HMST SPONGE THK3/8 (HEMOSTASIS) ×1
BAG SPEC THK2 15X12 ZIP CLS (MISCELLANEOUS)
BAG ZIPLOCK 12X15 (MISCELLANEOUS) IMPLANT
BANDAGE ACE 4X5 VEL STRL LF (GAUZE/BANDAGES/DRESSINGS) ×3 IMPLANT
BANDAGE ACE 6X5 VEL STRL LF (GAUZE/BANDAGES/DRESSINGS) ×3 IMPLANT
BLADE SAG 18X100X1.27 (BLADE) ×3 IMPLANT
BLADE SAW SGTL 13.0X1.19X90.0M (BLADE) ×3 IMPLANT
CAPT KNEE TOTAL 3 ATTUNE ×2 IMPLANT
CEMENT HV SMART SET (Cement) ×6 IMPLANT
CLOSURE WOUND 1/2 X4 (GAUZE/BANDAGES/DRESSINGS) ×1
CLOTH 2% CHLOROHEXIDINE 3PK (PERSONAL CARE ITEMS) ×3 IMPLANT
CUFF TOURN SGL QUICK 34 (TOURNIQUET CUFF) ×3
CUFF TRNQT CYL 34X4X40X1 (TOURNIQUET CUFF) ×1 IMPLANT
DECANTER SPIKE VIAL GLASS SM (MISCELLANEOUS) ×3 IMPLANT
DRAPE INCISE IOBAN 66X45 STRL (DRAPES) IMPLANT
DRAPE ORTHO SPLIT 77X108 STRL (DRAPES) ×6
DRAPE SHEET LG 3/4 BI-LAMINATE (DRAPES) ×3 IMPLANT
DRAPE SURG ORHT 6 SPLT 77X108 (DRAPES) ×2 IMPLANT
DRAPE U-SHAPE 47X51 STRL (DRAPES) ×3 IMPLANT
DRSG AQUACEL AG ADV 3.5X10 (GAUZE/BANDAGES/DRESSINGS) IMPLANT
DRSG TEGADERM 4X4.75 (GAUZE/BANDAGES/DRESSINGS) IMPLANT
DURAPREP 26ML APPLICATOR (WOUND CARE) ×3 IMPLANT
ELECT REM PT RETURN 9FT ADLT (ELECTROSURGICAL) ×3
ELECTRODE REM PT RTRN 9FT ADLT (ELECTROSURGICAL) ×1 IMPLANT
EVACUATOR 1/8 PVC DRAIN (DRAIN) IMPLANT
GAUZE SPONGE 2X2 8PLY STRL LF (GAUZE/BANDAGES/DRESSINGS) IMPLANT
GLOVE BIOGEL PI IND STRL 7.0 (GLOVE) ×1 IMPLANT
GLOVE BIOGEL PI IND STRL 8 (GLOVE) ×1 IMPLANT
GLOVE BIOGEL PI INDICATOR 7.0 (GLOVE) ×10
GLOVE BIOGEL PI INDICATOR 8 (GLOVE) ×2
GLOVE SURG SS PI 7.0 STRL IVOR (GLOVE) ×9 IMPLANT
GLOVE SURG SS PI 7.5 STRL IVOR (GLOVE) ×3 IMPLANT
GLOVE SURG SS PI 8.0 STRL IVOR (GLOVE) ×6 IMPLANT
GOWN STRL REUS W/TWL XL LVL3 (GOWN DISPOSABLE) ×12 IMPLANT
HANDPIECE INTERPULSE COAX TIP (DISPOSABLE) ×3
HEMOSTAT SPONGE AVITENE ULTRA (HEMOSTASIS) ×3 IMPLANT
IMMOBILIZER KNEE 20 (SOFTGOODS) ×3
IMMOBILIZER KNEE 20 THIGH 36 (SOFTGOODS) ×1 IMPLANT
MANIFOLD NEPTUNE II (INSTRUMENTS) ×3 IMPLANT
NS IRRIG 1000ML POUR BTL (IV SOLUTION) IMPLANT
PACK TOTAL KNEE CUSTOM (KITS) ×3 IMPLANT
POSITIONER SURGICAL ARM (MISCELLANEOUS) ×3 IMPLANT
SET HNDPC FAN SPRY TIP SCT (DISPOSABLE) ×1 IMPLANT
SPONGE GAUZE 2X2 STER 10/PKG (GAUZE/BANDAGES/DRESSINGS)
SPONGE SURGIFOAM ABS GEL 100 (HEMOSTASIS) IMPLANT
STAPLER VISISTAT (STAPLE) IMPLANT
STRIP CLOSURE SKIN 1/2X4 (GAUZE/BANDAGES/DRESSINGS) ×1 IMPLANT
SUT BONE WAX W31G (SUTURE) ×2 IMPLANT
SUT MNCRL AB 4-0 PS2 18 (SUTURE) IMPLANT
SUT STRATAFIX 0 PDS 27 VIOLET (SUTURE) ×3
SUT VIC AB 1 CT1 27 (SUTURE) ×6
SUT VIC AB 1 CT1 27XBRD ANTBC (SUTURE) ×2 IMPLANT
SUT VIC AB 2-0 CT1 27 (SUTURE) ×9
SUT VIC AB 2-0 CT1 TAPERPNT 27 (SUTURE) ×3 IMPLANT
SUTURE STRATFX 0 PDS 27 VIOLET (SUTURE) ×1 IMPLANT
SYR 50ML LL SCALE MARK (SYRINGE) ×3 IMPLANT
TOWER CARTRIDGE SMART MIX (DISPOSABLE) ×3 IMPLANT
TRAY FOLEY W/METER SILVER 16FR (SET/KITS/TRAYS/PACK) ×3 IMPLANT
WATER STERILE IRR 1500ML POUR (IV SOLUTION) ×5 IMPLANT
WRAP KNEE MAXI GEL POST OP (GAUZE/BANDAGES/DRESSINGS) ×3 IMPLANT
YANKAUER SUCT BULB TIP 10FT TU (MISCELLANEOUS) ×3 IMPLANT

## 2016-01-25 NOTE — Transfer of Care (Signed)
Immediate Anesthesia Transfer of Care Note  Patient: [name]  Procedure(s) Performed: Procedure(s): LEFT TOTAL KNEE ARTHROPLASTY, EVALUATION UNDER ANESTHESIA AND MANIPULATION UNDER ANESTHESIA (Left)  Patient Location: PACU  Anesthesia Type:General  Level of Consciousness:  sedated, patient cooperative and responds to stimulation  Airway & Oxygen Therapy:Patient Spontanous Breathing and Patient connected to face mask oxgen  Post-op Assessment:  Report given to PACU RN and Post -op Vital signs reviewed and stable  Post vital signs:  Reviewed and stable  Last Vitals:  Vitals:   01/25/16 0557  BP: (!) 139/98  Pulse: (!) 57  Resp: 18  Temp: [id number]    Complications: No apparent anesthesia complications

## 2016-01-25 NOTE — Anesthesia Preprocedure Evaluation (Signed)
Anesthesia Evaluation  Patient identified by MRN, date of birth, ID band Patient awake    Reviewed: Allergy & Precautions, H&P , NPO status , Patient's Chart, lab work & pertinent test results  Airway Mallampati: II   Neck ROM: full    Dental   Pulmonary sleep apnea ,    breath sounds clear to auscultation       Cardiovascular hypertension, + Valvular Problems/Murmurs MVP  Rhythm:regular Rate:Normal     Neuro/Psych  Neuromuscular disease    GI/Hepatic   Endo/Other    Renal/GU      Musculoskeletal  (+) Arthritis ,   Abdominal   Peds  Hematology   Anesthesia Other Findings   Reproductive/Obstetrics                             Anesthesia Physical Anesthesia Plan  ASA: II  Anesthesia Plan: General   Post-op Pain Management:    Induction: Intravenous  Airway Management Planned: Oral ETT  Additional Equipment:   Intra-op Plan:   Post-operative Plan: Extubation in OR  Informed Consent: I have reviewed the patients History and Physical, chart, labs and discussed the procedure including the risks, benefits and alternatives for the proposed anesthesia with the patient or authorized representative who has indicated his/her understanding and acceptance.     Plan Discussed with: CRNA, Anesthesiologist and Surgeon  Anesthesia Plan Comments:         Anesthesia Quick Evaluation

## 2016-01-25 NOTE — Brief Op Note (Signed)
01/25/2016  9:04 AM  PATIENT:  [name]  71 y.o. female  PRE-OPERATIVE DIAGNOSIS:  left knee degenerative joint disease  POST-OPERATIVE DIAGNOSIS:  left knee degenerative joint disease  PROCEDURE:  Procedure(s): LEFT TOTAL KNEE ARTHROPLASTY, EVALUATION UNDER ANESTHESIA AND MANIPULATION UNDER ANESTHESIA (Left)  SURGEON:  Surgeon(s) and Role:    * Susa Day, MD - Primary  PHYSICIAN ASSISTANT:   ASSISTANTS: [place]   ANESTHESIA:   general  EBL:  Total I/O In: 1000 [I.V.:1000] Out: 350 [Urine:350]  BLOOD ADMINISTERED:none  DRAINS: none   LOCAL MEDICATIONS USED:  MARCAINE     SPECIMEN:  No Specimen  DISPOSITION OF SPECIMEN:  N/A  COUNTS:  YES  TOURNIQUET:   Total Tourniquet Time Documented: Thigh (Left) - 50 minutes Total: Thigh (Left) - 50 minutes   DICTATION: .Other Dictation: Dictation Number [id number]  PLAN OF CARE: Admit to inpatient   PATIENT DISPOSITION:  PACU - hemodynamically stable.   Delay start of Pharmacological VTE agent (>24hrs) due to surgical blood loss or risk of bleeding: no

## 2016-01-25 NOTE — Discharge Instructions (Signed)

## 2016-01-25 NOTE — Anesthesia Procedure Notes (Signed)
Procedure Name: Intubation Date/Time: 01/25/2016 7:32 AM Performed by: Montel Clock Pre-anesthesia Checklist: Patient identified, Emergency Drugs available, Suction available, Patient being monitored and Timeout performed Patient Re-evaluated:Patient Re-evaluated prior to inductionOxygen Delivery Method: Circle system utilized Preoxygenation: Pre-oxygenation with 100% oxygen Intubation Type: IV induction Ventilation: Mask ventilation without difficulty Laryngoscope Size: Mac and 3 Grade View: Grade II Tube type: Oral Tube size: 7.5 mm Number of attempts: 1 Airway Equipment and Method: Stylet Placement Confirmation: ETT inserted through vocal cords under direct vision,  positive ETCO2 and breath sounds checked- equal and bilateral Secured at: 21 cm Tube secured with: Tape Dental Injury: Teeth and Oropharynx as per pre-operative assessment  Comments: Grade 2 with downward laryngeal pressure.

## 2016-01-25 NOTE — Interval H&P Note (Signed)
History and Physical Interval Note:  01/25/2016 7:22 AM  [name]  has presented today for surgery, with the diagnosis of left knee DJD  The various methods of treatment have been discussed with the patient and family. After consideration of risks, benefits and other options for treatment, the patient has consented to  Procedure(s): LEFT TOTAL KNEE ARTHROPLASTY, EVALUATION UNDER ANESTHESIA AND MANIPULATION UNDER ANESTHESIA (Left) as a surgical intervention .  The patient's history has been reviewed, patient examined, no change in status, stable for surgery.  I have reviewed the patient's chart and labs.  Questions were answered to the patient's satisfaction.     Lyrique Hakim C

## 2016-01-25 NOTE — Anesthesia Postprocedure Evaluation (Signed)
Anesthesia Post Note  Patient: [name]  Procedure(s) Performed: Procedure(s) (LRB): LEFT TOTAL KNEE ARTHROPLASTY, EVALUATION UNDER ANESTHESIA AND MANIPULATION UNDER ANESTHESIA (Left)  Patient location during evaluation: PACU Anesthesia Type: General Level of consciousness: awake and alert and patient cooperative Pain management: pain level controlled Vital Signs Assessment: post-procedure vital signs reviewed and stable Respiratory status: spontaneous breathing and respiratory function stable Cardiovascular status: stable Anesthetic complications: no       Last Vitals:  Vitals:   01/25/16 1000 01/25/16 1015  BP: 123/60 135/63  Pulse: (!) 58 68  Resp: 13 14  Temp:      Last Pain:  Vitals:   01/25/16 1015  TempSrc:   PainSc: [place]

## 2016-01-25 NOTE — H&P (View-Only) (Signed)
[name] DOB: [birth year] Married / Language: Cleophus [name] / Race: White Female  H&P Date: 01/01/16  Chief Complaint: Left knee pain; Right knee stiffness  History of Present Illness The patient is a 71 year old female who comes in today for a preoperative History and Physical. The patient is scheduled for a left total knee arthroplasty to be performed by Dr. Johnn Hai, MD at [facility] on 01/25/2016. [name] reports chronic pain for years in her left knee which are refractory to conservative tx including steroid injections, viscosupplementation, NSAIDs, pain medications, activity modifications, quad strengthening and relative rest. Her pain is interfering with ADLs and quality of life at this point and she desires to proceed with surgery. She also is c/o stiffness in the right knee, 5 years s/p TKA. She states she never regained full ROM post-op and is wondering if anything can be done for the right knee while she is under anesthesia for the left. The right knee causes difficulty getting in and out of the car, out of a chair, and with steps.  Dr. Tonita Cong and the patient mutually agreed to proceed with a total knee replacement. Risks and benefits of the procedure were discussed including stiffness, suboptimal range of motion, persistent pain, infection requiring removal of prosthesis and reinsertion, need for prophylactic antibiotics in the future, for example, dental procedures, possible need for manipulation, revision in the future and also anesthetic complications including DVT, PE, etc. We discussed the perioperative course, time in the hospital, postoperative recovery and the need for elevation to control swelling. We also discussed the predicted range of motion and the probability that squatting and kneeling would be unobtainable in the future. In addition, postoperative anticoagulation was discussed. We have obtained preoperative medical clearance as necessary. Provided her  illustrated handout and discussed it in detail. They will enroll in the total joint replacement educational forum at the hospital.  She is not yet scheduled for her pre-op with [facility] hospital. She has been cleared by Dr. Renold Genta. She had to stop taking ibuprofen previously for bleeding in her eye. She did have Xarelto s/p her R TKA 5 years ago. She is requesting a CPM as it was helpful last time. She went to [facility] for rehab last time and is planning to go to [facility] this time. She lives alone.  Problem List/Past Medical Hx  Primary osteoarthritis of first carpometacarpal joint of left hand (M18.12)  Foot pain (M79.673)  Trigger finger, left middle finger (M65.332)  Sleep Apnea  uses CPAP Oophorectomy  right Osteoarthritis  Heart murmur  MVP Hypercholesterolemia  Tinnitus  Dentures  2 partial Bronchitis  in past Urinary Incontinence  stress incontinence  Allergies  Mobic *ANALGESICS - ANTI-INFLAMMATORY*  Morphine Sulfate (Concentrate) *ANALGESICS - OPIOID*  SULFA [12/02/2001]: VERIFY WITH PATIENT NSAID INTOLERANCE [04/06/2007]: Allergies Reconciled   Family History  Liver Disease, Chronic  mother Kidney disease  mother Congestive Heart Failure  mother First Degree Relatives  Heart disease in female family member before age 42  Heart Disease  mother Hypertension  mother and grandmother mothers side- aneurysm Drug / Alcohol Addiction  mother Diabetes Mellitus  grandmother mothers side Other medical problems  DAUGHTER WITH KIDNEY STONES Father  MI x 5 Mother  kidney and heart failure, autoimmune disorder  Social History  Tobacco use  Never smoker. never smoker  Medication History Voltaren (1% Gel, 4 gram(s) Transdermal four times daily, Taken starting 10/09/2015) Active. (5 100g tubes- jcb/jmb/acw) Fish Oil (Oral) Specific strength unknown -  Active. Losartan Potassium (50MG  Tablet, Oral) Active. Flaxseed Oil Specific strength  unknown - Active. Atenolol (50MG  Tablet, Oral) Active. CoQ10 (Oral) Specific strength unknown - Active. Cymbalta (Oral) Specific strength unknown - Active. Zocor (Oral) Specific strength unknown - Active. Alpha Lipoic Acid (Oral) Specific strength unknown - Active. Vitamin D3 (2000UNIT Tablet, Oral) Active. Acetyl L-Carnitine (Oral) Specific strength unknown - Active. Medications Reconciled  Past Surgical History Breast Biopsy  right Arthroscopy of Knee  bilateral Breast Mass; Local Excision  right Foot Surgery  left Tubal Ligation  Spinal Surgery  Other Surgery  THUMB BONE SPUR Tonsillectomy  Total Knee Replacement  right, 2012, Dr. Tonita Cong, general anesthesia Ovary Removal - Right   Review of Systems General Not Present- Chills, Fatigue, Fever, Memory Loss, Night Sweats, Weight Gain and Weight Loss. Skin Not Present- Eczema, Hives, Itching, Lesions and Rash. HEENT Present- Dentures and Tinnitus. Not Present- Double Vision, Headache, Hearing Loss and Visual Loss. Respiratory Not Present- Allergies, Chronic Cough, Coughing up blood, Shortness of breath at rest and Shortness of breath with exertion. Cardiovascular Present- Murmur. Not Present- Chest Pain, Difficulty Breathing Lying Down, Palpitations, Racing/skipping heartbeats and Swelling. Gastrointestinal Not Present- Abdominal Pain, Bloody Stool, Constipation, Diarrhea, Difficulty Swallowing, Heartburn, Jaundice, Loss of appetitie, Nausea and Vomiting. Female Genitourinary Present- Incontinence (stress) and Urinating at Night. Not Present- Blood in Urine, Discharge, Flank Pain, Painful Urination, Urgency, Urinary frequency, Urinary Retention and Weak urinary stream. Musculoskeletal Present- Back Pain, Joint Pain, Muscle Pain and Spasms. Not Present- Joint Swelling, Morning Stiffness and Muscle Weakness. Neurological Not Present- Blackout spells, Difficulty with balance, Dizziness, Paralysis, Tremor and  Weakness. Psychiatric Not Present- Insomnia.  Physical Exam General Mental Status -Alert, cooperative and good historian. General Appearance-pleasant, Not in acute distress. Orientation-Oriented X3. Build & Nutrition-Well nourished and Well developed.  Head and Neck Head-normocephalic, atraumatic . Neck Global Assessment - supple, no bruit auscultated on the right, no bruit auscultated on the left.  Eye Pupil - Bilateral-Regular and Round. Motion - Bilateral-EOMI.  Chest and Lung Exam Auscultation Breath sounds - clear at anterior chest wall and clear at posterior chest wall. Adventitious sounds - No Adventitious sounds.  Cardiovascular Auscultation Rhythm - Regular rate and rhythm. Heart Sounds - S1 WNL and S2 WNL. Murmurs & Other Heart Sounds - Auscultation of the heart reveals - No Murmurs.  Abdomen Palpation/Percussion Tenderness - Abdomen is non-tender to palpation. Rigidity (guarding) - Abdomen is soft. Auscultation Auscultation of the abdomen reveals - Bowel sounds normal.  Female Genitourinary Not done, not pertinent to present illness  Musculoskeletal On exam, she is well-nourished, well-developed, awake, alert, and oriented x3, in mild distress due to pain, has an antalgic gait. Left knee, mild effusion. Tender to palpation medial joint line diffusely in the posterior aspect of the knee and positive patellofemoral pain. Nontender lateral joint space, patellar tendon, quad tendon, fibular head, peroneal nerve, range of motion approximately 0 to 90 degrees. Positive patellofemoral crepitus.  Right knee anterior midline incision from TKA well-healed with no sign of infection. ROM approx -5-70 degrees. No calf pain or sign of DVT. Nontender. No instability noted. No pain or laxity with varus/valgus stress.  Imaging Prior x-rays reviewed today by Dr. Tonita Cong, bone-on-bone patellofemoral space, moderate narrowing medial joint space, periarticular osteophytes  noted. Right knee xrays with TKA in excellent aligment with no sign of osteolysis or loosening. No fx, subluxation, dislocation, lytic or blastic lesions.  Assessment & Plan 1. Unilateral primary osteoarthritis, left knee (M17.12) 2. Arthrofibrosis of knee joint, right (M24.661)  Pt with end-stage Left knee DJD, bone-on-bone, refractory to conservative tx, scheduled for Left total knee replacement by Dr. Tonita Cong, will also add EUA/MUA right knee for arthrofibrosis to the procedure due to her ongoing stiffness. We again discussed the procedure itself as well as risks, complications and alternatives, including but not limited to DVT, PE, infx, bleeding, failure of procedure, need for secondary procedure including manipulation, nerve injury, ongoing pain/symptoms, anesthesia risk, even stroke or death. Also discussed typical post-op protocols, activity restrictions, need for PT, flexion/extension exercises, time out of work. Discussed need for DVT ppx post-op with Xarelto then ASA per protocol. Discussed dental ppx. Also discussed limitations post-operatively such as kneeling and squatting. All questions were answered. Patient desires to proceed with surgery as scheduled. Will hold ASA, supplements and NSAIDs accordingly. Will remain NPO after MN night before surgery. Will present to [facility] for pre-op testing. Plan ASA 325mg  BID 2 weeks post-op for DVT ppx then ASA 325mg  daily. Plan Norco, Robaxin, Colace. Plan rehab initially post-op to [facility] as she lives alone, then home with HHPT vs. directly to outpt PT. Will follow up 10-14 days post-op for suture removal and xrays.  Signed electronically by Cecilie Kicks, PA-C for Dr. Tonita Cong

## 2016-01-26 LAB — BASIC METABOLIC PANEL
ANION GAP: 6 (ref 5–15)
BUN: 15 mg/dL (ref 6–20)
CALCIUM: 8.3 mg/dL — AB (ref 8.9–10.3)
CO2: 26 mmol/L (ref 22–32)
Chloride: 105 mmol/L (ref 101–111)
Creatinine, Ser: 0.71 mg/dL (ref 0.44–1.00)
GLUCOSE: 160 mg/dL — AB (ref 65–99)
POTASSIUM: 4.3 mmol/L (ref 3.5–5.1)
SODIUM: 137 mmol/L (ref 135–145)

## 2016-01-26 LAB — CBC
HCT: 35.2 % — ABNORMAL LOW (ref 36.0–46.0)
Hemoglobin: 11.7 g/dL — ABNORMAL LOW (ref 12.0–15.0)
MCH: 29.3 pg (ref 26.0–34.0)
MCHC: 33.2 g/dL (ref 30.0–36.0)
MCV: 88.2 fL (ref 78.0–100.0)
PLATELETS: 179 10*3/uL (ref 150–400)
RBC: 3.99 MIL/uL (ref 3.87–5.11)
RDW: 12.6 % (ref 11.5–15.5)
WBC: 11.4 10*3/uL — AB (ref 4.0–10.5)

## 2016-01-26 NOTE — NC FL2 (Signed)
[place] LEVEL OF CARE SCREENING TOOL     IDENTIFICATION  Patient Name: [name] Birthdate: 1945-04-25 Sex: female Admission Date (Current Location): 01/25/2016  [facility] and [place] Number:  Herbalist and Address:  [facility],  [address], [place]      Provider Number: [id number]  Attending Physician Name and Address:  Susa Day, MD  Relative Name and Phone Number:       Current Level of Care: Hospital Recommended Level of Care: [place] Prior Approval Number:    Date Approved/Denied:   PASRR Number: [id number]  Discharge Plan: SNF    Current Diagnoses: Patient Active Problem List   Diagnosis Date Noted  . Left knee DJD 01/25/2016  . Pre-hypertension 10/20/2015  . Primary osteoarthritis of left knee 01/24/2012  . Decreased libido 11/05/2011  . Osteoarthritis of left knee 11/11/2010  . Impaired glucose tolerance 11/11/2010  . Vitamin D deficiency 11/11/2010  . Obesity 07/14/2010  . Mitral valve prolapse 07/14/2010  . HYPERLIPIDEMIA 10/01/2007  . OBSTRUCTIVE SLEEP APNEA 10/01/2007    Orientation RESPIRATION BLADDER Height & Weight     Self, Time, Situation, Place  Normal Continent Weight: 185 lb (83.9 kg) Height:  5\' 6"  (167.6 cm)  BEHAVIORAL SYMPTOMS/MOOD NEUROLOGICAL BOWEL NUTRITION STATUS  Other (Comment) (no behaviors)   Continent Diet  AMBULATORY STATUS COMMUNICATION OF NEEDS Skin   Extensive Assist Verbally Surgical wounds                       Personal Care Assistance Level of Assistance  Bathing, Feeding, Dressing Bathing Assistance: Limited assistance Feeding assistance: Independent Dressing Assistance: Limited assistance     Functional Limitations Info  Sight, Hearing, Speech Sight Info: Adequate Hearing Info: Adequate Speech Info: Adequate    SPECIAL CARE FACTORS FREQUENCY  PT (By licensed PT), OT (By licensed OT)     PT Frequency: 5x wk OT  Frequency: 5x wk            Contractures Contractures Info: Not present    Additional Factors Info  Code Status Code Status Info: Full Code             Current Medications (01/26/2016):  This is the current hospital active medication list Current Facility-Administered Medications  Medication Dose Route Frequency Provider Last Rate Last Dose  . acetaminophen (TYLENOL) tablet 650 mg  650 mg Oral Q6H PRN Susa Day, MD   650 mg at 01/25/16 1855   Or  . acetaminophen (TYLENOL) suppository 650 mg  650 mg Rectal Q6H PRN Susa Day, MD      . acidophilus (RISAQUAD) capsule 1 capsule  1 capsule Oral Daily Susa Day, MD   1 capsule at 01/26/16 0801  . alum & mag hydroxide-simeth (MAALOX/MYLANTA) 200-200-20 MG/5ML suspension 30 mL  30 mL Oral Q4H PRN Susa Day, MD      . aspirin EC tablet 325 mg  325 mg Oral BID PC Susa Day, MD   325 mg at 01/26/16 0801  . atenolol (TENORMIN) tablet 50 mg  50 mg Oral Daily Susa Day, MD   50 mg at 01/26/16 0801  . bisacodyl (DULCOLAX) EC tablet 5 mg  5 mg Oral Daily PRN Susa Day, MD      . cholecalciferol (VITAMIN D) tablet 1,000 Units  1,000 Units Oral BID Susa Day, MD   1,000 Units at 01/26/16 0800  . clobetasol cream (TEMOVATE) [id number] % 1 application  1  application Topical BID PRN Susa Day, MD      . dextrose 5 % and 0.45 % NaCl with KCl 20 mEq/L infusion   Intravenous Continuous Susa Day, MD   Stopped at 01/26/16 0800  . diphenhydrAMINE (BENADRYL) 12.5 MG/5ML elixir 12.5-25 mg  12.5-25 mg Oral Q4H PRN Susa Day, MD      . docusate sodium (COLACE) capsule 100 mg  100 mg Oral BID Susa Day, MD   100 mg at 01/26/16 0759  . DULoxetine (CYMBALTA) DR capsule 30 mg  30 mg Oral Daily Susa Day, MD   30 mg at 01/26/16 0801  . DULoxetine (CYMBALTA) DR capsule 60 mg  60 mg Oral Daily Susa Day, MD   60 mg at 01/26/16 0802  . fluticasone (CUTIVATE) 0.005 % ointment 1 application  1 application Topical BID PRN  Susa Day, MD      . HYDROmorphone (DILAUDID) injection 1 mg  1 mg Intravenous Q2H PRN Susa Day, MD      . losartan (COZAAR) tablet 50 mg  50 mg Oral Daily Susa Day, MD      . magnesium citrate solution 1 Bottle  1 Bottle Oral Once PRN Susa Day, MD      . magnesium gluconate (MAGONATE) tablet 500 mg  500 mg Oral QHS Susa Day, MD   500 mg at 01/25/16 2208  . menthol-cetylpyridinium (CEPACOL) lozenge 3 mg  1 lozenge Oral PRN Susa Day, MD       Or  . phenol (CHLORASEPTIC) mouth spray 1 spray  1 spray Mouth/Throat PRN Susa Day, MD      . methocarbamol (ROBAXIN) tablet 500 mg  500 mg Oral Q6H PRN Susa Day, MD   500 mg at 01/26/16 0054   Or  . methocarbamol (ROBAXIN) 500 mg in dextrose 5 % 50 mL IVPB  500 mg Intravenous Q6H PRN Susa Day, MD   500 mg at 01/25/16 1034  . metoCLOPramide (REGLAN) tablet 5-10 mg  5-10 mg Oral Q8H PRN Susa Day, MD       Or  . metoCLOPramide (REGLAN) injection 5-10 mg  5-10 mg Intravenous Q8H PRN Susa Day, MD      . ondansetron [facility]) tablet 4 mg  4 mg Oral Q6H PRN Susa Day, MD       Or  . ondansetron [facility]) injection 4 mg  4 mg Intravenous Q6H PRN Susa Day, MD      . oxyCODONE (Oxy IR/ROXICODONE) immediate release tablet 5-10 mg  5-10 mg Oral Q3H PRN Susa Day, MD   10 mg at 01/26/16 0604  . polyethylene glycol (MIRALAX / GLYCOLAX) packet 17 g  17 g Oral Daily PRN Susa Day, MD         Discharge Medications: Please see discharge summary for a list of discharge medications.  Relevant Imaging Results:  Relevant Lab Results:   Additional Information SS # [id number]  Donaciano Range, Randall An, LCSW

## 2016-01-26 NOTE — Clinical Social Work Placement (Signed)
   CLINICAL SOCIAL WORK PLACEMENT  NOTE  Date:  01/26/2016  Patient Details  Name: [name] MRN: [id number] Date of Birth: 02/26/1945  Clinical Social Work is seeking post-discharge placement for this patient at the [place] level of care (*CSW will initial, date and re-position this form in  chart as items are completed):  No   Patient/family provided with [place] Work Department's list of facilities offering this level of care within the geographic area requested by the patient (or if unable, by the patient's family).  Yes   Patient/family informed of their freedom to choose among providers that offer the needed level of care, that participate in Medicare, Medicaid or managed care program needed by the patient, have an available bed and are willing to accept the patient.  Yes   Patient/family informed of [place]'s ownership interest in [facility] and [facility], as well as of the fact that they are under no obligation to receive care at these facilities.  PASRR submitted to EDS on       PASRR number received on       Existing PASRR number confirmed on 01/26/16     FL2 transmitted to all facilities in geographic area requested by pt/family on 01/26/16     FL2 transmitted to all facilities within larger geographic area on       Patient informed that his/her managed care company has contracts with or will negotiate with certain facilities, including the following:        Yes   Patient/family informed of bed offers received.  Patient chooses bed at [facility]     Physician recommends and patient chooses bed at      Patient to be transferred to [facility] on  .  Patient to be transferred to facility by       Patient family notified on   of transfer.  Name of family member notified:        PHYSICIAN       Additional Comment:     _______________________________________________ [name], [place] 01/26/2016, 9:49 AM

## 2016-01-26 NOTE — Discharge Summary (Signed)
Physician Discharge Summary   Patient ID: [name] MRN: [id number] DOB/AGE: [birth year] 72 y.o.  Admit date: 01/25/2016 Discharge date: 01/27/16  Primary Diagnosis: Left knee primary osteoarthritis  Admission Diagnoses:  Past Medical History:  Diagnosis Date  . Arthritis   . Bronchitis   . Genital HSV 03/2013   Positive HSV 1 genital PCR  . Heart murmur   . Hyperlipidemia   . Hypertension    pt states it is pre-hypertention  . Mitral valve prolapse    since age 23  . Overactive bladder   . Ringing in ears 2010   interfers with clarity of hearing at times  . Sleep apnea    mild -never tolerated cpap  . Stress incontinence in female    wears a pad   Discharge Diagnoses:   Active Problems:   Primary osteoarthritis of left knee   Left knee DJD  Estimated body mass index is 29.86 kg/m as calculated from the following:   Height as of this encounter: 5' 6" (1.676 m).   Weight as of this encounter: 83.9 kg (185 lb).  Procedure:  Procedure(s) (LRB): LEFT TOTAL KNEE ARTHROPLASTY, EVALUATION UNDER ANESTHESIA AND MANIPULATION UNDER ANESTHESIA (Left)   Consults: None  HPI: see H&P Laboratory Data: Admission on 01/25/2016  Component Date Value Ref Range Status  . WBC 01/26/2016 11.4* 4.0 - 10.5 K/uL Final  . RBC 01/26/2016 3.99  3.87 - 5.11 MIL/uL Final  . Hemoglobin 01/26/2016 11.7* 12.0 - 15.0 g/dL Final  . HCT 01/26/2016 35.2* 36.0 - 46.0 % Final  . MCV 01/26/2016 88.2  78.0 - 100.0 fL Final  . MCH 01/26/2016 29.3  26.0 - 34.0 pg Final  . MCHC 01/26/2016 33.2  30.0 - 36.0 g/dL Final  . RDW 01/26/2016 12.6  11.5 - 15.5 % Final  . Platelets 01/26/2016 179  150 - 400 K/uL Final  . Sodium 01/26/2016 137  135 - 145 mmol/L Final  . Potassium 01/26/2016 4.3  3.5 - 5.1 mmol/L Final  . Chloride 01/26/2016 105  101 - 111 mmol/L Final  . CO2 01/26/2016 26  22 - 32 mmol/L Final  . Glucose, Bld 01/26/2016 160* 65 - 99 mg/dL Final  . BUN 01/26/2016 15  6 - 20 mg/dL Final    . Creatinine, Ser 01/26/2016 0.71  0.44 - 1.00 mg/dL Final  . Calcium 01/26/2016 8.3* 8.9 - 10.3 mg/dL Final  . GFR calc non Af Amer 01/26/2016 >60  >60 mL/min Final  . GFR calc Af Amer 01/26/2016 >60  >60 mL/min Final   Comment: (NOTE) The eGFR has been calculated using the CKD EPI equation. This calculation has not been validated in all clinical situations. eGFR's persistently <60 mL/min signify possible Chronic Kidney Disease.   Georgiann Hahn gap 01/26/2016 6  5 - 15 Final  Hospital Outpatient Visit on 01/19/2016  Component Date Value Ref Range Status  . aPTT 01/19/2016 30  24 - 36 seconds Final  . Sodium 01/19/2016 141  135 - 145 mmol/L Final  . Potassium 01/19/2016 4.6  3.5 - 5.1 mmol/L Final  . Chloride 01/19/2016 107  101 - 111 mmol/L Final  . CO2 01/19/2016 27  22 - 32 mmol/L Final  . Glucose, Bld 01/19/2016 101* 65 - 99 mg/dL Final  . BUN 01/19/2016 20  6 - 20 mg/dL Final  . Creatinine, Ser 01/19/2016 0.88  0.44 - 1.00 mg/dL Final  . Calcium 01/19/2016 9.3  8.9 - 10.3 mg/dL Final  . GFR calc non  Af Amer 01/19/2016 >60  >60 mL/min Final  . GFR calc Af Amer 01/19/2016 >60  >60 mL/min Final   Comment: (NOTE) The eGFR has been calculated using the CKD EPI equation. This calculation has not been validated in all clinical situations. eGFR's persistently <60 mL/min signify possible Chronic Kidney Disease.   . Anion gap 01/19/2016 7  5 - 15 Final  . WBC 01/19/2016 6.2  4.0 - 10.5 K/uL Final  . RBC 01/19/2016 4.53  3.87 - 5.11 MIL/uL Final  . Hemoglobin 01/19/2016 13.4  12.0 - 15.0 g/dL Final  . HCT 01/19/2016 40.1  36.0 - 46.0 % Final  . MCV 01/19/2016 88.5  78.0 - 100.0 fL Final  . MCH 01/19/2016 29.6  26.0 - 34.0 pg Final  . MCHC 01/19/2016 33.4  30.0 - 36.0 g/dL Final  . RDW 01/19/2016 12.7  11.5 - 15.5 % Final  . Platelets 01/19/2016 217  150 - 400 K/uL Final  . Prothrombin Time 01/19/2016 12.9  11.4 - 15.2 seconds Final  . INR 01/19/2016 0.97   Final  . ABO/RH(D)  01/25/2016 O POS   Final  . Antibody Screen 01/25/2016 NEG   Final  . Sample Expiration 01/25/2016 01/28/2016   Final  . Extend sample reason 01/25/2016 NO TRANSFUSIONS OR PREGNANCY IN THE PAST 3 MONTHS   Final  . Color, Urine 01/19/2016 YELLOW  YELLOW Final  . APPearance 01/19/2016 CLEAR  CLEAR Final  . Specific Gravity, Urine 01/19/2016 1.019  1.005 - 1.030 Final  . pH 01/19/2016 6.0  5.0 - 8.0 Final  . Glucose, UA 01/19/2016 NEGATIVE  NEGATIVE mg/dL Final  . Hgb urine dipstick 01/19/2016 NEGATIVE  NEGATIVE Final  . Bilirubin Urine 01/19/2016 NEGATIVE  NEGATIVE Final  . Ketones, ur 01/19/2016 NEGATIVE  NEGATIVE mg/dL Final  . Protein, ur 01/19/2016 NEGATIVE  NEGATIVE mg/dL Final  . Nitrite 01/19/2016 NEGATIVE  NEGATIVE Final  . Leukocytes, UA 01/19/2016 TRACE* NEGATIVE Final  . RBC / HPF 01/19/2016 0-5  0 - 5 RBC/hpf Final  . WBC, UA 01/19/2016 0-5  0 - 5 WBC/hpf Final  . Bacteria, UA 01/19/2016 RARE* NONE SEEN Final  . Squamous Epithelial / LPF 01/19/2016 0-5* NONE SEEN Final  . Mucous 01/19/2016 PRESENT   Final  . MRSA, PCR 01/19/2016 NEGATIVE  NEGATIVE Final  . Staphylococcus aureus 01/19/2016 NEGATIVE  NEGATIVE Final   Comment:        The Xpert SA Assay (FDA approved for NASAL specimens in patients over 72 years of age), is one component of a comprehensive surveillance program.  Test performance has been validated by [facility] for patients greater than or equal to 72 year old. It is not intended to diagnose infection nor to guide or monitor treatment.      X-Rays:Dg Knee Left Port  Result Date: 01/25/2016 CLINICAL DATA:  Status post total knee replacement EXAM: PORTABLE LEFT KNEE - 1-2 VIEW COMPARISON:  None. FINDINGS: Frontal and lateral views were obtained. Patient is undergone total knee arthroplasty with femoral and tibial prosthetic components well-seated. No acute fracture or dislocation. Soft tissue air in the joint is an expected postoperative finding.  IMPRESSION: Prosthetic components well-seated. No acute fracture or dislocation. Electronically Signed   By: Lowella Grip III M.D.   On: 01/25/2016 10:29    EKG: Orders placed or performed in visit on 03/21/15  . EKG 12-Lead     Hospital Course: [name] is a 71 y.o. who was admitted to [facility]. They  were brought to the operating room on 01/25/2016 and underwent Procedure(s): LEFT TOTAL KNEE ARTHROPLASTY, EVALUATION UNDER ANESTHESIA AND MANIPULATION UNDER ANESTHESIA.  Patient tolerated the procedure well and was later transferred to the recovery room and then to the orthopaedic floor for postoperative care.  They were given PO and IV analgesics for pain control following their surgery.  They were given 24 hours of postoperative antibiotics of  Anti-infectives    Start     Dose/Rate Route Frequency Ordered Stop   01/25/16 1400  ceFAZolin (ANCEF) IVPB 2g/100 mL premix     2 g 200 mL/hr over 30 Minutes Intravenous Every 6 hours 01/25/16 1339 01/26/16 0323   01/25/16 0808  polymyxin B 500,000 Units, bacitracin 50,000 Units in sodium chloride irrigation 0.9 % 500 mL irrigation  Status:  Discontinued       As needed 01/25/16 0808 01/25/16 0938   01/25/16 0600  ceFAZolin (ANCEF) IVPB 2g/100 mL premix     2 g 200 mL/hr over 30 Minutes Intravenous On call to O.R. 01/25/16 0510 01/25/16 0734     and started on DVT prophylaxis in the form of Aspirin, TED hose and SCDs.   PT and OT were ordered for total joint protocol.  Discharge planning consulted to help with postop disposition and equipment needs.  Patient had a good night on the evening of surgery.  They started to get up OOB with therapy on day one. Continued to work with therapy into day two. By day two, the patient had progressed with therapy and meeting their goals.  Incision was healing well.  Patient was seen in rounds and was ready to go to rehab.   Diet: Regular diet Activity:WBAT Follow-up:in 10-14  days Disposition - Rehab - [facility] Discharged Condition: good    Allergies as of 01/26/2016      Reactions   Sulfa Antibiotics Hives   Codeine Itching   Morphine    REACTION: itch   Sulfonamide Derivatives    REACTION: itch      Medication List    STOP taking these medications   co-enzyme Q-10 50 MG capsule   EQL FLAX SEED OIL 1000 MG Caps   Fish Oil 1000 MG Caps   simvastatin 20 MG tablet Commonly known as:  ZOCOR   TURMERIC PO     TAKE these medications   acetaminophen 500 MG tablet Commonly known as:  TYLENOL Take 1,000 mg by mouth every 6 (six) hours as needed (for pain).   aspirin EC 325 MG tablet Take 1 tablet (325 mg total) by mouth 2 (two) times daily.   atenolol 50 MG tablet Commonly known as:  TENORMIN TAKE 1 TABLET BY MOUTH EVERY DAY   cholecalciferol 1000 units tablet Commonly known as:  VITAMIN D Take 1,000 Units by mouth 2 (two) times daily.   clobetasol cream 0.05 % Commonly known as:  TEMOVATE Apply 1 application topically 2 (two) times daily as needed (for irritation/dry skin).   desoximetasone 0.25 % cream Commonly known as:  TOPICORT Apply 1 application topically 2 (two) times daily as needed (for dry skin/itching).   docusate sodium 100 MG capsule Commonly known as:  COLACE Take 1 capsule (100 mg total) by mouth 2 (two) times daily as needed for mild constipation.   DULoxetine 60 MG capsule Commonly known as:  CYMBALTA TAKE ONE CAPSULE BY MOUTH EVERY DAY   DULoxetine 30 MG capsule Commonly known as:  CYMBALTA TAKE ONE CAPSULE BY MOUTH EVERY DAY   fluticasone 0.005 %  ointment Commonly known as:  CUTIVATE Apply 1 application topically 2 (two) times daily as needed (for dry skin/irritation.).   ketoconazole 2 % cream Commonly known as:  NIZORAL Apply topically daily. What changed:  how much to take  when to take this  reasons to take this   losartan 50 MG tablet Commonly known as:  COZAAR TAKE 1 TABLET BY MOUTH  EVERY DAY   magnesium gluconate 500 MG tablet Commonly known as:  MAGONATE Take 500 mg by mouth at bedtime.   oxyCODONE-acetaminophen 5-325 MG tablet Commonly known as:  PERCOCET Take 1-2 tablets by mouth every 4 (four) hours as needed for severe pain.   polyethylene glycol packet Commonly known as:  MIRALAX / GLYCOLAX Take 17 g by mouth daily.   triamcinolone ointment 0.1 % Commonly known as:  KENALOG Apply 1 application topically 2 (two) times daily as needed (for skin irritation/dry skin).   VOLTAREN 1 % Gel Generic drug:  diclofenac sodium Apply 2 g topically 2 (two) times daily.      Follow-up Information    BEANE,JEFFREY C, MD Follow up in 2 week(s).   Specialty:  Orthopedic Surgery Contact information: [address] [place] [address] [phone]           Signed: Lacie Draft PA-C Orthopaedic Surgery 01/26/2016, 7:58 AM

## 2016-01-26 NOTE — Evaluation (Signed)
Physical Therapy Evaluation Patient Details Name: [name] MRN: [id number] DOB: 11-28-1945 Today's Date: 01/26/2016   History of Present Illness   s/p L TKA, R knee manipulation   Clinical Impression  Pt is s/p TKA resulting in the deficits listed below (see PT Problem List).  Pt will benefit from skilled PT to increase their independence and safety with mobility to allow discharge to the venue listed below.      Follow Up Recommendations SNF    Equipment Recommendations  None recommended by PT    Recommendations for Other Services       Precautions / Restrictions Precautions Precautions: Fall Required Braces or Orthoses: Knee Immobilizer - Left Restrictions Weight Bearing Restrictions: No Other Position/Activity Restrictions: WBAT      Mobility  Bed Mobility Overal bed mobility: Needs Assistance Bed Mobility: Supine to Sit     Supine to sit: Min assist     General bed mobility comments: assist wtih LLE  Transfers Overall transfer level: Needs assistance Equipment used: Rolling walker (2 wheeled) Transfers: Sit to/from Stand Sit to Stand: Min assist;From elevated surface         General transfer comment: cues for hand placement and LLE position  Ambulation/Gait Ambulation/Gait assistance: Min assist Ambulation Distance (Feet): 22 Feet Assistive device: Rolling walker (2 wheeled) Gait Pattern/deviations: Step-to pattern;Trunk flexed     General Gait Details: very slow gait, cues for posture, sequence, RW distance from self  Stairs            Wheelchair Mobility    Modified Rankin (Stroke Patients Only)       Balance                                             Pertinent Vitals/Pain Pain Assessment: 0-10 Pain Score: 4  Pain Location: L knee Pain Descriptors / Indicators: Discomfort;Sore Pain Intervention(s): Limited activity within patient's tolerance;Monitored during session;Premedicated before  session;Repositioned;Ice applied    Home Living Family/patient expects to be discharged to:: Skilled nursing facility Living Arrangements: Alone                    Prior Function Level of Independence: Independent               Hand Dominance        Extremity/Trunk Assessment   Upper Extremity Assessment Upper Extremity Assessment: Defer to OT evaluation    Lower Extremity Assessment Lower Extremity Assessment: RLE deficits/detail RLE Deficits / Details: ankle WFL, ~ -25* to 80*; strength 3+/5 LLE Deficits / Details: ankle WFL; knee grossly 2+/5, limited by post op  pain and weakness LLE: Unable to fully assess due to pain       Communication   Communication: No difficulties  Cognition Arousal/Alertness: Awake/alert Behavior During Therapy: WFL for tasks assessed/performed Overall Cognitive Status: Within Functional Limits for tasks assessed                      General Comments      Exercises Total Joint Exercises Ankle Circles/Pumps: AROM;Both;10 reps Quad Sets: 10 reps;Both;AROM   Assessment/Plan    PT Assessment Patient needs continued PT services  PT Problem List Decreased strength;Decreased activity tolerance;Decreased knowledge of use of DME;Decreased mobility;Decreased range of motion;Pain          PT Treatment Interventions DME instruction;Gait training;Functional mobility training;Therapeutic exercise;Therapeutic  activities;Patient/family education    PT Goals (Current goals can be found in the Care Plan section)  Acute Rehab PT Goals Patient Stated Goal: to go to rehab and work on both knees  PT Goal Formulation: With patient Time For Goal Achievement: 02/02/16 Potential to Achieve Goals: Good    Frequency 7X/week   Barriers to discharge        Co-evaluation               End of Session Equipment Utilized During Treatment: Gait belt;Left knee immobilizer Activity Tolerance: Patient tolerated treatment  well Patient left: in chair;with call bell/phone within reach;with chair alarm set Nurse Communication: Mobility status         Time: 1001-1035 PT Time Calculation (min) (ACUTE ONLY): 34 min   Charges:     PT Treatments $Gait Training: 8-22 mins   PT G Codes:        [name] 02/15/16, 1:37 PM

## 2016-01-26 NOTE — Op Note (Signed)
NAMELADORIS, HILBY NO.:  [id number]  MEDICAL RECORD NO.:  [id number]  LOCATION:  [facility]                         FACILITY:  [facility]  PHYSICIAN:  Susa Day, M.D.    DATE OF BIRTH:  [birth year]  DATE OF PROCEDURE: DATE OF DISCHARGE:                              OPERATIVE REPORT   PREOPERATIVE DIAGNOSES: 1. Degenerative joint disease, end-stage, left knee. 2. Arthrofibrosis, status post total knee on the right.  POSTOPERATIVE DIAGNOSES: 1. Degenerative joint disease, end-stage, left knee. 2. Arthrofibrosis, status post total knee on the right.  PROCEDURE PERFORMED: 1. Manipulation under anesthesia, right knee. 2. Left total knee arthroplasty utilizing Depuy Attune 5 femur, 5     tibia, 5 insert, 38 patella.  ANESTHESIA:  General.  ASSISTANT:  Cleophas Dunker, PA.  HISTORY:  70, bone-on-bone arthrosis, left knee, indicated for placement of degenerated joint.  Risks and benefits discussed including bleeding, infection, damage to neurovascular structures, suboptimal range of motion, DVT, PE, anesthetic complications, etc.  Also she has arthrofibrosis, history of total knee on the right, 5 years, did well initially, then felt some contractures, indicated for potential manipulation prior to the left total knee.  TECHNIQUE:  With the patient in supine position, after induction of adequate anesthesia 2 g Kefzol.  Manipulated the knee on the right, were laxed, we had approximately -10 of extension, and we had 85 of flexion. A gentle manipulative force was provided within full extension as well as full flexion over 14minute period of time.  Perhaps we achieved a little bit more flexion to 90 and slight improvement in extension but we did not provide significant manipulative force, and she was 5 years status post.  I felt that any additional improvement will require an open arthrotomy.  Next, the left lower extremity was prepped, draped, and exsanguinated in  usual sterile fashion.  Thigh tourniquet inflated to 275.  Midline incision was made over the knee.  Full-thickness flaps developed.  Median parapatellar arthrotomy performed.  Soft tissue was elevated medially preserving the MCL.  Knee flexed.  Tricompartmental osteoarthrosis noted particularly of patellofemoral joint, medial compartment.  Osteophytes removed with a rongeur.  Remnants of medial and lateral menisci and ACL removed as well.  Step drill was utilized in the femoral canal, was irrigated, 5-degree left was used with 10 off the distal femur due to flexion contracture.  This was then pinned.  I performed a distal femoral cut.  We then subluxed the tibia.  We used our external alignment guide to 4 off the defect, which was medial, 3 degree slope bisecting the tibiotalar joint parallel to the shaft.  This was pinned and performed a cut with an oscillating saw protecting soft tissues.  Our extension gap was straight with a 5 insert.  Then flexed the knee and subluxed the tibia.  We repaired the tibia.  I harvested bone from proximal tibia for the femoral canal.  I then sized to a 5. Pinned, Drilled it, and used our central punch guide.  Maximized our coverage of the surface of the tibia.  Just prior to this, we did measure and cut the femur.  It was measured to a 5.  3 degrees of external  rotation.  Referenced off the anterior cortex.  This was pinned.  I performed anterior and posterior chamfer cuts and box cut. We did not notch the femur.  We then placed our trial femur and tibia and a 5 insert.  We had full extension, full flexion, good stability, varus and valgus stress, 0-30 degrees.  Negative anterior drawer. Attention turned towards the patella, was measured at a 21.  We planed it to a 15 utilizing the patellar jig.  With an oscillating saw, all trialed to 38, medialized the peg holes, drilled them, placed a trial patella, reduced it, and had excellent patellofemoral  tracking.  Next, we removed all the trials after drilling the lug holes, checked posteriorly, cauterized the geniculates, popliteus. This was partially attenuated.  Loose body was retrieved posteriorly.  Copiously irrigated with pulsatile lavage.  Flexed the knee, all surfaces thoroughly dried. Mixed cement on back table in the appropriate fashion, injected in the tibia under pressure, and digitally pressurizing it, cemented the 5 tibial tray, impacted it, redundant cement removed.  Cemented the femur, impacted it, redundant cement removed.  Cemented the patella.  We used our compression guide.  We held an axial load throughout the curing of the cement.  Redundant cement removed.  Irrigation placed in the joint as was the lidocaine with epinephrine.  After full curing of the cement, we had excellent patellofemoral tracking.  Good stability, varus and valgus stressing 0-30 degrees.  Negative anterior drawer.  I selected a 5 insert, removed it, and meticulously removed all redundant cement. Copiously irrigated with antibiotic irrigation.  After pulsatile lavage, subluxed the tibia, placed a 5 insert, reduced it and had full extension, full flexion, good stability, varus and valgus stressing 0-30 degrees.  Negative anterior drawer.  I cauterized any bleeding after release of the tourniquet and that was done at 49 minutes.  Reapproximated the patellar arthrotomy with 1-0 Vicryl and then oversewn with a running STRATAFIX, subcu with 2-0, and skin with Prolene.  She had flexion to gravity after closure, full extension, full flexion, and good stability.  Sterile dressing applied, placed in immobilizer, extubated without difficulty, and transported to the recovery room in satisfactory condition.  The patient tolerated the procedure well.  No complications.  Assistant, [place], [place]. Minimal blood loss.     Susa Day, M.D.     Geralynn Rile  D:  01/25/2016  T:  01/26/2016  Job:  [id number]

## 2016-01-26 NOTE — Progress Notes (Signed)
OT Cancellation Note  Patient Details Name: JUANA DILAURA MRN: [id number] DOB: [birth year]   Cancelled Treatment:    Reason Eval/Treat Not Completed: Other (comment).  Noted d/c plan is for SNF.  Will defer OT to that venue.  Craige Patel 01/26/2016, 7:49 AM  Lesle Chris, OTR/L [phone] 01/26/2016

## 2016-01-26 NOTE — Progress Notes (Signed)
Patient ID: [name], female   DOB: 1945/10/26, 71 y.o.   MRN: [id number] Subjective: 1 Day Post-Op Procedure(s) (LRB): LEFT TOTAL KNEE ARTHROPLASTY, EVALUATION UNDER ANESTHESIA AND MANIPULATION UNDER ANESTHESIA (Left) Patient reports pain as moderate.    Patient has complaints of burning type pain L knee. No other c/o  We will start therapy today. Plan is to go to rehab after hospital stay.  Objective: Vital signs in last 24 hours: Temp:  [97.5 F (36.4 C)-99 F (37.2 C)] 98.8 F (37.1 C) (01/12 0614) Pulse Rate:  [57-77] 72 (01/12 0614) Resp:  [10-18] 16 (01/12 0614) BP: (102-141)/(48-88) 126/54 (01/12 0614) SpO2:  [93 %-100 %] 99 % (01/12 0614) Weight:  [83.9 kg (185 lb)] 83.9 kg (185 lb) (01/11 1330)  Intake/Output from previous day:  Intake/Output Summary (Last 24 hours) at 01/26/16 0755 Last data filed at 01/26/16 [id number]  Gross per 24 hour  Intake           4372.5 ml  Output             3325 ml  Net           1047.5 ml    Intake/Output this shift: No intake/output data recorded.  Labs: Results for orders placed or performed during the hospital encounter of 01/25/16  CBC  Result Value Ref Range   WBC 11.4 (H) 4.0 - 10.5 K/uL   RBC 3.99 3.87 - 5.11 MIL/uL   Hemoglobin 11.7 (L) 12.0 - 15.0 g/dL   HCT 35.2 (L) 36.0 - 46.0 %   MCV 88.2 78.0 - 100.0 fL   MCH 29.3 26.0 - 34.0 pg   MCHC 33.2 30.0 - 36.0 g/dL   RDW 12.6 11.5 - 15.5 %   Platelets 179 150 - 400 K/uL  Basic metabolic panel  Result Value Ref Range   Sodium 137 135 - 145 mmol/L   Potassium 4.3 3.5 - 5.1 mmol/L   Chloride 105 101 - 111 mmol/L   CO2 26 22 - 32 mmol/L   Glucose, Bld 160 (H) 65 - 99 mg/dL   BUN 15 6 - 20 mg/dL   Creatinine, Ser 0.71 0.44 - 1.00 mg/dL   Calcium 8.3 (L) 8.9 - 10.3 mg/dL   GFR calc non Af Amer >60 >60 mL/min   GFR calc Af Amer >60 >60 mL/min   Anion gap 6 5 - 15    Exam - Neurologically intact ABD soft Neurovascular intact Sensation intact distally Intact  pulses distally Dorsiflexion/Plantar flexion intact Incision: dressing C/D/I and no drainage No cellulitis present Compartment soft no calf pain or sign of DVT Dressing - clean, dry, no drainage Motor function intact - moving foot and toes well on exam.   Assessment/Plan: 1 Day Post-Op Procedure(s) (LRB): LEFT TOTAL KNEE ARTHROPLASTY, EVALUATION UNDER ANESTHESIA AND MANIPULATION UNDER ANESTHESIA (Left)  Advance diet Up with therapy D/C IV fluids Past Medical History:  Diagnosis Date  . Arthritis   . Bronchitis   . Genital HSV 03/2013   Positive HSV 1 genital PCR  . Heart murmur   . Hyperlipidemia   . Hypertension    pt states it is pre-hypertention  . Mitral valve prolapse    since age 10  . Overactive bladder   . Ringing in ears 2010   interfers with clarity of hearing at times  . Sleep apnea    mild -never tolerated cpap  . Stress incontinence in female    wears a pad    DVT Prophylaxis -  ASA Protocol 325mg  BID Weight-Bearing as tolerated to Left leg No vaccines. Plan D/C to rehab- [facility]- tomorrow as long as she makes good progress with PT today and pain remains well controlled.  BISSELL, JACLYN M. 01/26/2016, 7:55 AM

## 2016-01-26 NOTE — Progress Notes (Signed)
   01/26/16 1400  PT Visit Information  Last PT Received On 01/26/16  Assistance Needed +1  History of Present Illness s/p L TKA, R knee manipulation --per pt Dr. Tonita Cong was unable to manipulate knee enough to incr ROM on R  Precautions  Precautions Fall;Knee  Required Braces or Orthoses Knee Immobilizer - Left  Restrictions  Other Position/Activity Restrictions WBAT  Pain Assessment  Pain Assessment 0-10  Pain Score 3  Pain Location L knee  Pain Descriptors / Indicators Discomfort;Sore;Grimacing;Guarding  Pain Intervention(s) Limited activity within patient's tolerance;Monitored during session;Premedicated before session  Cognition  Arousal/Alertness Awake/alert  Behavior During Therapy WFL for tasks assessed/performed  Overall Cognitive Status Within Functional Limits for tasks assessed  General Comments small skin tear L posterior thigh, RN made aware  Bed Mobility  Overal bed mobility Needs Assistance  Bed Mobility Supine to Sit;Sit to Supine  Supine to sit Min guard  Sit to supine Min guard  General bed mobility comments pt self assisting LLE  Transfers  Overall transfer level Needs assistance  Equipment used Rolling walker (2 wheeled)  Transfers Sit to/from Stand  Sit to Stand Min assist;From elevated surface  General transfer comment cues for hand placement and LLE position  Ambulation/Gait  Ambulation/Gait assistance Min assist  Ambulation Distance (Feet) 60 Feet  Assistive device Rolling walker (2 wheeled)  Gait Pattern/deviations Step-to pattern;Trunk flexed  General Gait Details very slow gait, cues for posture, sequence, RW distance from self  Total Joint Exercises  Ankle Circles/Pumps AROM;Both;10 reps  Quad Sets 10 reps;Both;AROM  Heel Slides AAROM;Left;10 reps  Hip ABduction/ADduction AAROM;Left;10 reps  Straight Leg Raises AAROM;Left;10 reps  Goniometric ROM approx -15* to 45*, extremely muscle guarding limiting flexion  PT - End of Session  Equipment  Utilized During Treatment Left knee immobilizer  Activity Tolerance Patient tolerated treatment well  Patient left in bed;with call bell/phone within reach;with bed alarm set  Nurse Communication Mobility status  PT Plan  PT Frequency (ACUTE ONLY) 7X/week  PT Recommendation  Follow Up Recommendations SNF  PT equipment None recommended by PT  Acute Rehab PT Goals  Patient Stated Goal to go to rehab and work on both knees   PT Goal Formulation With patient  Time For Goal Achievement 02/02/16  Potential to Achieve Goals Good  PT Time Calculation  PT Start Time (ACUTE ONLY) 1346  PT Stop Time (ACUTE ONLY) 1412  PT Time Calculation (min) (ACUTE ONLY) 26 min  PT General Charges  $$ ACUTE PT VISIT 1 Procedure  PT Treatments  $Gait Training 8-22 mins

## 2016-01-26 NOTE — Progress Notes (Addendum)
Spoke with pt concerning DME, pt have RW, shower chair and BSC, not needs.  Pt discharging to [facility], she had [facility] in the past.

## 2016-01-26 NOTE — Clinical Social Work Note (Signed)
Clinical Social Work Assessment  Patient Details  Name: [name] MRN: [id number] Date of Birth: [birth year]  Date of referral:  01/26/16               Reason for consult:  Facility Placement, Discharge Planning                Permission sought to share information with:  Chartered certified accountant granted to share information::  Yes, Verbal Permission Granted  Name::        Agency::     Relationship::     Contact Information:     Housing/Transportation Living arrangements for the past 2 months:  Single Family Home Source of Information:  Patient, Adult Children Patient Interpreter Needed:  None Criminal Activity/Legal Involvement Pertinent to Current Situation/Hospitalization:  No - Comment as needed Significant Relationships:  Adult Children Lives with:  Self Do you feel safe going back to the place where you live?  No (SNF needed.) Need for family participation in patient care:  No (Coment)  Care giving concerns: Pt's care cannot be managed at home following hospital d/c.   Social Worker assessment / plan:  Pt hospitalized from home on 01/25/15 for pre planned left total knee arthroplasty. CSW met with pt to assist with d/c planning. Pt has made prior arrangements for ST Rehab at [facility] at d/c. Clinicals have been sent to [facility] for review. CSW will continue to follow to assist with d/c planning to SNF.  Employment status:  Retired Forensic scientist:  Medicare PT Recommendations:  [place] / Referral to community resources:  [place]  Patient/Family's Response to care:  Pt feels SNF is needed.  Patient/Family's Understanding of and Emotional Response to Diagnosis, Current Treatment, and Prognosis:  Pt is aware of her medical status. She is motivated to work with PT and is looking forward to having rehab at [facility]  Emotional Assessment Appearance:  Appears stated  age Attitude/Demeanor/Rapport:   (cooperative) Affect (typically observed):  Pleasant, Appropriate, Calm Orientation:  Oriented to Self, Oriented to Place, Oriented to  Time, Oriented to Situation Alcohol / Substance use:  Not Applicable Psych involvement (Current and /or in the community):  No (Comment)  Discharge Needs  Concerns to be addressed:  Discharge Planning Concerns Readmission within the last 30 days:  No Current discharge risk:  None Barriers to Discharge:  No Barriers Identified   [name], [place] 01/26/2016, 8:44 AM

## 2016-01-27 DIAGNOSIS — M1990 Unspecified osteoarthritis, unspecified site: Secondary | ICD-10-CM | POA: Diagnosis not present

## 2016-01-27 DIAGNOSIS — Z96652 Presence of left artificial knee joint: Secondary | ICD-10-CM | POA: Diagnosis not present

## 2016-01-27 DIAGNOSIS — R2689 Other abnormalities of gait and mobility: Secondary | ICD-10-CM | POA: Diagnosis not present

## 2016-01-27 DIAGNOSIS — R7309 Other abnormal glucose: Secondary | ICD-10-CM | POA: Diagnosis not present

## 2016-01-27 DIAGNOSIS — M25569 Pain in unspecified knee: Secondary | ICD-10-CM | POA: Diagnosis not present

## 2016-01-27 DIAGNOSIS — F331 Major depressive disorder, recurrent, moderate: Secondary | ICD-10-CM | POA: Diagnosis not present

## 2016-01-27 DIAGNOSIS — D6489 Other specified anemias: Secondary | ICD-10-CM | POA: Diagnosis not present

## 2016-01-27 DIAGNOSIS — G4733 Obstructive sleep apnea (adult) (pediatric): Secondary | ICD-10-CM | POA: Diagnosis not present

## 2016-01-27 DIAGNOSIS — M179 Osteoarthritis of knee, unspecified: Secondary | ICD-10-CM | POA: Diagnosis not present

## 2016-01-27 DIAGNOSIS — M6281 Muscle weakness (generalized): Secondary | ICD-10-CM | POA: Diagnosis not present

## 2016-01-27 DIAGNOSIS — F329 Major depressive disorder, single episode, unspecified: Secondary | ICD-10-CM | POA: Diagnosis not present

## 2016-01-27 DIAGNOSIS — I10 Essential (primary) hypertension: Secondary | ICD-10-CM | POA: Diagnosis not present

## 2016-01-27 DIAGNOSIS — R531 Weakness: Secondary | ICD-10-CM | POA: Diagnosis not present

## 2016-01-27 DIAGNOSIS — Z4789 Encounter for other orthopedic aftercare: Secondary | ICD-10-CM | POA: Diagnosis not present

## 2016-01-27 DIAGNOSIS — E785 Hyperlipidemia, unspecified: Secondary | ICD-10-CM | POA: Diagnosis not present

## 2016-01-27 DIAGNOSIS — I341 Nonrheumatic mitral (valve) prolapse: Secondary | ICD-10-CM | POA: Diagnosis not present

## 2016-01-27 DIAGNOSIS — E784 Other hyperlipidemia: Secondary | ICD-10-CM | POA: Diagnosis not present

## 2016-01-27 LAB — CBC
HCT: 32.7 % — ABNORMAL LOW (ref 36.0–46.0)
Hemoglobin: 10.8 g/dL — ABNORMAL LOW (ref 12.0–15.0)
MCH: 29.8 pg (ref 26.0–34.0)
MCHC: 33 g/dL (ref 30.0–36.0)
MCV: 90.1 fL (ref 78.0–100.0)
PLATELETS: 144 10*3/uL — AB (ref 150–400)
RBC: 3.63 MIL/uL — AB (ref 3.87–5.11)
RDW: 12.9 % (ref 11.5–15.5)
WBC: 7.9 10*3/uL (ref 4.0–10.5)

## 2016-01-27 NOTE — Progress Notes (Signed)
Pt discharged to SNF. Left unit on stretcher pushed by ambulance staff. Left in stable condition. Copy of AVS with date and time of last med administration sent with patient. VWilliams,rn.

## 2016-01-27 NOTE — Progress Notes (Signed)
Physical Therapy Treatment Patient Details Name: SHELLINA KOLMAN MRN: [id number] DOB: 24-Oct-1945 Today's Date: 02-09-16    History of Present Illness  s/p L TKA, R knee manipulation --per pt Dr. Tonita Cong was unable to manipulate knee enough to incr ROM on R    PT Comments    Progressing slowly, will benefit from SNF  Follow Up Recommendations  SNF     Equipment Recommendations  None recommended by PT    Recommendations for Other Services       Precautions / Restrictions Precautions Precautions: Fall;Knee Required Braces or Orthoses: Knee Immobilizer - Left Restrictions Weight Bearing Restrictions: No Other Position/Activity Restrictions: WBAT    Mobility  Bed Mobility               General bed mobility comments: pt in recliner  Transfers Overall transfer level: Needs assistance Equipment used: Rolling walker (2 wheeled) Transfers: Sit to/from Stand Sit to Stand: Min assist;Mod assist         General transfer comment: cues for hand placement and LLE position--encouraging knee flexion bil as much as possible, control of descent  Ambulation/Gait Ambulation/Gait assistance: Min assist;Min guard Ambulation Distance (Feet): 50 Feet Assistive device: Rolling walker (2 wheeled) Gait Pattern/deviations: Step-to pattern;Trunk flexed     General Gait Details: very slow gait, cues for posture, sequence, RW distance from self   Stairs            Wheelchair Mobility    Modified Rankin (Stroke Patients Only)       Balance                                    Cognition Arousal/Alertness: Awake/alert Behavior During Therapy: WFL for tasks assessed/performed Overall Cognitive Status: Within Functional Limits for tasks assessed                      Exercises Total Joint Exercises Ankle Circles/Pumps: AROM;Both;10 reps Knee Flexion: AAROM;Left;5 reps;Seated Goniometric ROM: grossly -15* to 60* AAROM/PROM L knee flexion in  sitting    General Comments        Pertinent Vitals/Pain Pain Assessment: 0-10 Pain Score: 5  Pain Location: L knee Pain Descriptors / Indicators: Discomfort;Sore;Grimacing;Guarding Pain Intervention(s): Premedicated before session;Monitored during session;Limited activity within patient's tolerance;Ice applied    Home Living                      Prior Function            PT Goals (current goals can now be found in the care plan section) Acute Rehab PT Goals Patient Stated Goal: to go to rehab and work on both knees  PT Goal Formulation: With patient Time For Goal Achievement: 02/02/16 Potential to Achieve Goals: Good Progress towards PT goals: Progressing toward goals    Frequency    7X/week      PT Plan Current plan remains appropriate    Co-evaluation             End of Session   Activity Tolerance: Patient tolerated treatment well Patient left: in chair;with call bell/phone within reach     Time: 1157-1214 PT Time Calculation (min) (ACUTE ONLY): 17 min  Charges:  $Gait Training: 8-22 mins                    G Codes:      Levi Crass February 09, 2016, 12:30  PM   

## 2016-01-27 NOTE — H&P (Signed)
Report called and given to [facility], LPN at [place] facility. All nurse's questions answered to her satisfaction. Per Education officer, museum, pt to be transferred to snf after 1330. VWilliams,rn.

## 2016-01-27 NOTE — Clinical Social Work Placement (Signed)
   CLINICAL SOCIAL WORK PLACEMENT  NOTE  Date:  01/27/2016  Patient Details  Name: [name] MRN: [id number] Date of Birth: 1945-11-22  Clinical Social Work is seeking post-discharge placement for this patient at the [place] level of care (*CSW will initial, date and re-position this form in  chart as items are completed):  No   Patient/family provided with [place] Work Department's list of facilities offering this level of care within the geographic area requested by the patient (or if unable, by the patient's family).  Yes   Patient/family informed of their freedom to choose among providers that offer the needed level of care, that participate in Medicare, Medicaid or managed care program needed by the patient, have an available bed and are willing to accept the patient.  Yes   Patient/family informed of [place]'s ownership interest in [facility] and [facility], as well as of the fact that they are under no obligation to receive care at these facilities.  PASRR submitted to EDS on       PASRR number received on       Existing PASRR number confirmed on 01/26/16     FL2 transmitted to all facilities in geographic area requested by pt/family on 01/26/16     FL2 transmitted to all facilities within larger geographic area on       Patient informed that his/her managed care company has contracts with or will negotiate with certain facilities, including the following:        Yes   Patient/family informed of bed offers received.  Patient chooses bed at [facility]     Physician recommends and patient chooses bed at      Patient to be transferred to [facility] on 01/27/16.  Patient to be transferred to facility by Ambulance (EMS)     Patient family notified on 01/27/16 of transfer.  Name of family member notified:  Patient and daughter [name]     PHYSICIAN       Additional Comment: OK  per MD for d/c today to SNF.  Notified Janie- Admissions at [facility] who is expecting patient. Pt requests transport via EMS.  Daughter is in agreement. Nursing notified to call report and DC summary sent to facility for review.  No further CSW intervention is indicated. CSW signing off.     _______________________________________________ Williemae Area, LCSW 01/27/2016, 11:18 AM

## 2016-01-27 NOTE — Progress Notes (Signed)
Subjective: 2 Days Post-Op Procedure(s) (LRB): LEFT TOTAL KNEE ARTHROPLASTY, EVALUATION UNDER ANESTHESIA AND MANIPULATION UNDER ANESTHESIA (Left) Patient reports pain as moderate when bending.  Progressing with PT. tolerating Po's. Positive flatus. Denies SOB, CP, or calf pain.  Objective: Vital signs in last 24 hours: Temp:  [99 F (37.2 C)-99.4 F (37.4 C)] 99.1 F (37.3 C) (01/13 0615) Pulse Rate:  [70-74] 74 (01/13 0615) Resp:  [15-16] 16 (01/13 0615) BP: (120-137)/(46-66) 137/66 (01/13 0615) SpO2:  [95 %-97 %] 95 % (01/13 0615)  Intake/Output from previous day: 01/12 0701 - 01/13 0700 In: 1080 [P.O.:1080] Out: 250 [Urine:250] Intake/Output this shift: No intake/output data recorded.   Recent Labs  01/26/16 0431 01/27/16 0527  HGB 11.7* 10.8*    Recent Labs  01/26/16 0431 01/27/16 0527  WBC 11.4* 7.9  RBC 3.99 3.63*  HCT 35.2* 32.7*  PLT 179 144*    Recent Labs  01/26/16 0431  NA 137  K 4.3  CL 105  CO2 26  BUN 15  CREATININE 0.71  GLUCOSE 160*  CALCIUM 8.3*   No results for input(s): LABPT, INR in the last 72 hours.  Alert and oriented x3. RRR, Lungs clear, BS x4. Left Calf soft and non tender. L knee dressing C/D/I. No DVT signs. No signs of infection or compartment syndrome. LLE grossly neurovascularly intact.   Assessment/Plan: 2 Days Post-Op Procedure(s) (LRB): LEFT TOTAL KNEE ARTHROPLASTY, EVALUATION UNDER ANESTHESIA AND MANIPULATION UNDER ANESTHESIA (Left) D/c to SNF today F/u in office Follow instructions Up with PT   Fleet Higham L 01/27/2016, 7:59 AM

## 2016-01-29 DIAGNOSIS — D6489 Other specified anemias: Secondary | ICD-10-CM | POA: Diagnosis not present

## 2016-01-29 DIAGNOSIS — I10 Essential (primary) hypertension: Secondary | ICD-10-CM | POA: Diagnosis not present

## 2016-01-29 DIAGNOSIS — Z96652 Presence of left artificial knee joint: Secondary | ICD-10-CM | POA: Diagnosis not present

## 2016-01-29 DIAGNOSIS — G4733 Obstructive sleep apnea (adult) (pediatric): Secondary | ICD-10-CM | POA: Diagnosis not present

## 2016-01-29 DIAGNOSIS — M179 Osteoarthritis of knee, unspecified: Secondary | ICD-10-CM | POA: Diagnosis not present

## 2016-01-29 DIAGNOSIS — E785 Hyperlipidemia, unspecified: Secondary | ICD-10-CM | POA: Diagnosis not present

## 2016-01-29 DIAGNOSIS — E784 Other hyperlipidemia: Secondary | ICD-10-CM | POA: Diagnosis not present

## 2016-01-29 DIAGNOSIS — F329 Major depressive disorder, single episode, unspecified: Secondary | ICD-10-CM | POA: Diagnosis not present

## 2016-01-29 DIAGNOSIS — R7309 Other abnormal glucose: Secondary | ICD-10-CM | POA: Diagnosis not present

## 2016-01-30 ENCOUNTER — Other Ambulatory Visit: Payer: Self-pay | Admitting: *Deleted

## 2016-01-30 DIAGNOSIS — E785 Hyperlipidemia, unspecified: Secondary | ICD-10-CM | POA: Diagnosis not present

## 2016-01-30 DIAGNOSIS — I10 Essential (primary) hypertension: Secondary | ICD-10-CM | POA: Diagnosis not present

## 2016-01-30 DIAGNOSIS — M179 Osteoarthritis of knee, unspecified: Secondary | ICD-10-CM | POA: Diagnosis not present

## 2016-01-30 DIAGNOSIS — F329 Major depressive disorder, single episode, unspecified: Secondary | ICD-10-CM | POA: Diagnosis not present

## 2016-01-30 NOTE — Patient Outreach (Signed)
[facility] ([facility]) Care Management  01/30/2016  Efrata D Avilla 08/12/1945 [id number]   Met with patient at bedside of facility. 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 This is her second time having knee surgery so she is aware of the recovery. She states she has very supportive friends who will assist her upon discharge with shopping and transportation. She has not issues with medications. She denies any care management needs at this time.  RNCM gave patient a [facility] program brochure and magnet for future reference.  Plan to sign off as no [facility] needs assessed at this time.  Mary E. Niemczura, RN, BSN, CCM  [facility] Post Acute Care Coordinator [phone]  

## 2016-01-31 DIAGNOSIS — F329 Major depressive disorder, single episode, unspecified: Secondary | ICD-10-CM | POA: Diagnosis not present

## 2016-01-31 DIAGNOSIS — I10 Essential (primary) hypertension: Secondary | ICD-10-CM | POA: Diagnosis not present

## 2016-01-31 DIAGNOSIS — E785 Hyperlipidemia, unspecified: Secondary | ICD-10-CM | POA: Diagnosis not present

## 2016-01-31 DIAGNOSIS — M179 Osteoarthritis of knee, unspecified: Secondary | ICD-10-CM | POA: Diagnosis not present

## 2016-02-01 DIAGNOSIS — M179 Osteoarthritis of knee, unspecified: Secondary | ICD-10-CM | POA: Diagnosis not present

## 2016-02-01 DIAGNOSIS — I341 Nonrheumatic mitral (valve) prolapse: Secondary | ICD-10-CM | POA: Diagnosis not present

## 2016-02-01 DIAGNOSIS — I10 Essential (primary) hypertension: Secondary | ICD-10-CM | POA: Diagnosis not present

## 2016-02-01 DIAGNOSIS — E785 Hyperlipidemia, unspecified: Secondary | ICD-10-CM | POA: Diagnosis not present

## 2016-02-05 DIAGNOSIS — M25561 Pain in right knee: Secondary | ICD-10-CM | POA: Diagnosis not present

## 2016-02-05 DIAGNOSIS — M25562 Pain in left knee: Secondary | ICD-10-CM | POA: Diagnosis not present

## 2016-02-07 DIAGNOSIS — M25562 Pain in left knee: Secondary | ICD-10-CM | POA: Diagnosis not present

## 2016-02-07 DIAGNOSIS — M25561 Pain in right knee: Secondary | ICD-10-CM | POA: Diagnosis not present

## 2016-02-08 DIAGNOSIS — Z96652 Presence of left artificial knee joint: Secondary | ICD-10-CM | POA: Diagnosis not present

## 2016-02-09 DIAGNOSIS — M25561 Pain in right knee: Secondary | ICD-10-CM | POA: Diagnosis not present

## 2016-02-09 DIAGNOSIS — M25562 Pain in left knee: Secondary | ICD-10-CM | POA: Diagnosis not present

## 2016-02-12 DIAGNOSIS — M25562 Pain in left knee: Secondary | ICD-10-CM | POA: Diagnosis not present

## 2016-02-12 DIAGNOSIS — M25561 Pain in right knee: Secondary | ICD-10-CM | POA: Diagnosis not present

## 2016-02-14 DIAGNOSIS — M25562 Pain in left knee: Secondary | ICD-10-CM | POA: Diagnosis not present

## 2016-02-14 DIAGNOSIS — M25561 Pain in right knee: Secondary | ICD-10-CM | POA: Diagnosis not present

## 2016-02-16 DIAGNOSIS — M25561 Pain in right knee: Secondary | ICD-10-CM | POA: Diagnosis not present

## 2016-02-16 DIAGNOSIS — M25562 Pain in left knee: Secondary | ICD-10-CM | POA: Diagnosis not present

## 2016-02-19 DIAGNOSIS — M25561 Pain in right knee: Secondary | ICD-10-CM | POA: Diagnosis not present

## 2016-02-19 DIAGNOSIS — M25562 Pain in left knee: Secondary | ICD-10-CM | POA: Diagnosis not present

## 2016-02-21 DIAGNOSIS — M25561 Pain in right knee: Secondary | ICD-10-CM | POA: Diagnosis not present

## 2016-02-21 DIAGNOSIS — M25562 Pain in left knee: Secondary | ICD-10-CM | POA: Diagnosis not present

## 2016-02-23 DIAGNOSIS — M25562 Pain in left knee: Secondary | ICD-10-CM | POA: Diagnosis not present

## 2016-02-23 DIAGNOSIS — M25561 Pain in right knee: Secondary | ICD-10-CM | POA: Diagnosis not present

## 2016-02-26 DIAGNOSIS — M25561 Pain in right knee: Secondary | ICD-10-CM | POA: Diagnosis not present

## 2016-02-26 DIAGNOSIS — M25562 Pain in left knee: Secondary | ICD-10-CM | POA: Diagnosis not present

## 2016-03-07 DIAGNOSIS — Z471 Aftercare following joint replacement surgery: Secondary | ICD-10-CM | POA: Diagnosis not present

## 2016-03-07 DIAGNOSIS — Z96652 Presence of left artificial knee joint: Secondary | ICD-10-CM | POA: Diagnosis not present

## 2016-03-07 DIAGNOSIS — Z9889 Other specified postprocedural states: Secondary | ICD-10-CM | POA: Diagnosis not present

## 2016-04-10 ENCOUNTER — Other Ambulatory Visit: Payer: Self-pay | Admitting: Internal Medicine

## 2016-04-25 DIAGNOSIS — M24661 Ankylosis, right knee: Secondary | ICD-10-CM | POA: Diagnosis not present

## 2016-04-25 DIAGNOSIS — Z9889 Other specified postprocedural states: Secondary | ICD-10-CM | POA: Diagnosis not present

## 2016-04-25 DIAGNOSIS — Z96652 Presence of left artificial knee joint: Secondary | ICD-10-CM | POA: Diagnosis not present

## 2016-04-25 DIAGNOSIS — Z471 Aftercare following joint replacement surgery: Secondary | ICD-10-CM | POA: Diagnosis not present

## 2016-05-12 ENCOUNTER — Other Ambulatory Visit: Payer: Self-pay | Admitting: Internal Medicine

## 2016-05-12 NOTE — Telephone Encounter (Signed)
No Medicare CPE since 2016. Please book for the Summer before refilling.

## 2016-05-13 NOTE — Telephone Encounter (Signed)
Left detailed message pt, to call back to schedule a CPE.

## 2016-06-07 ENCOUNTER — Other Ambulatory Visit: Payer: Self-pay | Admitting: Internal Medicine

## 2016-06-07 ENCOUNTER — Other Ambulatory Visit: Payer: Self-pay | Admitting: Gynecology

## 2016-06-07 DIAGNOSIS — Z1231 Encounter for screening mammogram for malignant neoplasm of breast: Secondary | ICD-10-CM

## 2016-06-09 ENCOUNTER — Other Ambulatory Visit: Payer: Self-pay | Admitting: Internal Medicine

## 2016-06-10 ENCOUNTER — Other Ambulatory Visit: Payer: Self-pay | Admitting: Internal Medicine

## 2016-06-11 ENCOUNTER — Telehealth: Payer: Self-pay

## 2016-06-11 MED ORDER — DULOXETINE HCL 30 MG PO CPEP: 30.0000 mg | ORAL_CAPSULE | Freq: Every day | ORAL | 3 refills | Status: DC

## 2016-06-11 NOTE — Telephone Encounter (Signed)
Received fax from [facility] in regards to a refill on Cymbalta 30 mg for patient. Dr. Renold Genta approved refill for one year. Sent electronically.

## 2016-06-27 DIAGNOSIS — Z029 Encounter for administrative examinations, unspecified: Secondary | ICD-10-CM

## 2016-06-28 ENCOUNTER — Ambulatory Visit
Admission: RE | Admit: 2016-06-28 | Discharge: 2016-06-28 | Disposition: A | Discharge status: Home / Self Care | Payer: Medicare Other | Source: Ambulatory Visit | Attending: Gynecology | Admitting: Gynecology

## 2016-06-28 DIAGNOSIS — Z1231 Encounter for screening mammogram for malignant neoplasm of breast: Secondary | ICD-10-CM

## 2016-07-12 ENCOUNTER — Other Ambulatory Visit: Payer: Medicare Other | Admitting: Internal Medicine

## 2016-07-12 DIAGNOSIS — E785 Hyperlipidemia, unspecified: Secondary | ICD-10-CM

## 2016-07-12 DIAGNOSIS — R7302 Impaired glucose tolerance (oral): Secondary | ICD-10-CM

## 2016-07-12 DIAGNOSIS — E669 Obesity, unspecified: Secondary | ICD-10-CM

## 2016-07-12 DIAGNOSIS — Z0000 Encounter for general adult medical examination without abnormal findings: Secondary | ICD-10-CM | POA: Diagnosis not present

## 2016-07-12 DIAGNOSIS — R030 Elevated blood-pressure reading, without diagnosis of hypertension: Secondary | ICD-10-CM

## 2016-07-12 LAB — CBC WITH DIFFERENTIAL/PLATELET
Basophils Absolute: 41 cells/uL (ref 0–200)
Basophils Relative: 1 %
EOS PCT: 4 %
Eosinophils Absolute: 164 cells/uL (ref 15–500)
HEMATOCRIT: 42.7 % (ref 35.0–45.0)
HEMOGLOBIN: 13.9 g/dL (ref 11.7–15.5)
LYMPHS ABS: 1148 {cells}/uL (ref 850–3900)
LYMPHS PCT: 28 %
MCH: 29.4 pg (ref 27.0–33.0)
MCHC: 32.6 g/dL (ref 32.0–36.0)
MCV: 90.5 fL (ref 80.0–100.0)
MONO ABS: 451 {cells}/uL (ref 200–950)
MPV: 9.5 fL (ref 7.5–12.5)
Monocytes Relative: 11 %
NEUTROS PCT: 56 %
Neutro Abs: 2296 cells/uL (ref 1500–7800)
Platelets: 192 10*3/uL (ref 140–400)
RBC: 4.72 MIL/uL (ref 3.80–5.10)
RDW: 14.2 % (ref 11.0–15.0)
WBC: 4.1 10*3/uL (ref 3.8–10.8)

## 2016-07-12 LAB — COMPLETE METABOLIC PANEL WITH GFR
ALT: 18 U/L (ref 6–29)
AST: 22 U/L (ref 10–35)
Albumin: 4.4 g/dL (ref 3.6–5.1)
Alkaline Phosphatase: 70 U/L (ref 33–130)
BUN: 23 mg/dL (ref 7–25)
CHLORIDE: 108 mmol/L (ref 98–110)
CO2: 23 mmol/L (ref 20–31)
CREATININE: 0.93 mg/dL (ref 0.60–0.93)
Calcium: 9.2 mg/dL (ref 8.6–10.4)
GFR, Est African American: 72 mL/min (ref >=60)
GFR, Est Non African American: 62 mL/min (ref >=60)
GLUCOSE: 87 mg/dL (ref 65–99)
POTASSIUM: 4.7 mmol/L (ref 3.5–5.3)
SODIUM: 142 mmol/L (ref 135–146)
Total Bilirubin: 0.8 mg/dL (ref 0.2–1.2)
Total Protein: 6.7 g/dL (ref 6.1–8.1)

## 2016-07-12 LAB — LIPID PANEL
CHOL/HDL RATIO: 2.9 ratio (ref <5.0)
Cholesterol: 164 mg/dL (ref <200)
HDL: 57 mg/dL (ref >50)
LDL CALC: 92 mg/dL (ref <100)
TRIGLYCERIDES: 77 mg/dL (ref <150)
VLDL: 15 mg/dL (ref <30)

## 2016-07-12 LAB — TSH: TSH: 2.37 mIU/L

## 2016-07-13 LAB — HEMOGLOBIN A1C
Hgb A1c MFr Bld: 5.4 % (ref <5.7)
Mean Plasma Glucose: 108 mg/dL

## 2016-07-13 LAB — MICROALBUMIN / CREATININE URINE RATIO
CREATININE, URINE: 238 mg/dL (ref 20–320)
Microalb Creat Ratio: 8 mcg/mg creat (ref <30)
Microalb, Ur: 2 mg/dL

## 2016-07-15 ENCOUNTER — Ambulatory Visit (INDEPENDENT_AMBULATORY_CARE_PROVIDER_SITE_OTHER): Payer: Medicare Other | Admitting: Internal Medicine

## 2016-07-15 ENCOUNTER — Encounter: Payer: Self-pay | Admitting: Internal Medicine

## 2016-07-15 VITALS — BP 100/60 | HR 48 | Temp 97.5°F | Ht 65.0 in | Wt 188.0 lb

## 2016-07-15 DIAGNOSIS — E7849 Other hyperlipidemia: Secondary | ICD-10-CM

## 2016-07-15 DIAGNOSIS — I10 Essential (primary) hypertension: Secondary | ICD-10-CM

## 2016-07-15 DIAGNOSIS — S91209A Unspecified open wound of unspecified toe(s) with damage to nail, initial encounter: Secondary | ICD-10-CM

## 2016-07-15 DIAGNOSIS — M25461 Effusion, right knee: Secondary | ICD-10-CM

## 2016-07-15 DIAGNOSIS — Z96653 Presence of artificial knee joint, bilateral: Secondary | ICD-10-CM | POA: Diagnosis not present

## 2016-07-15 DIAGNOSIS — E784 Other hyperlipidemia: Secondary | ICD-10-CM

## 2016-07-15 DIAGNOSIS — E8881 Metabolic syndrome: Secondary | ICD-10-CM

## 2016-07-15 DIAGNOSIS — I341 Nonrheumatic mitral (valve) prolapse: Secondary | ICD-10-CM

## 2016-07-15 DIAGNOSIS — M25561 Pain in right knee: Secondary | ICD-10-CM

## 2016-07-15 DIAGNOSIS — R7302 Impaired glucose tolerance (oral): Secondary | ICD-10-CM | POA: Diagnosis not present

## 2016-07-15 DIAGNOSIS — G4733 Obstructive sleep apnea (adult) (pediatric): Secondary | ICD-10-CM

## 2016-07-15 DIAGNOSIS — Z0000 Encounter for general adult medical examination without abnormal findings: Secondary | ICD-10-CM | POA: Diagnosis not present

## 2016-07-15 LAB — POCT URINALYSIS DIPSTICK
BILIRUBIN UA: NEGATIVE
GLUCOSE UA: NEGATIVE
Ketones, UA: NEGATIVE
LEUKOCYTES UA: NEGATIVE
NITRITE UA: NEGATIVE
RBC UA: NEGATIVE
Spec Grav, UA: 1.01 (ref 1.010–1.025)
Urobilinogen, UA: 0.2 E.U./dL
pH, UA: 6 (ref 5.0–8.0)

## 2016-07-15 NOTE — Progress Notes (Signed)
Subjective:    Patient ID: [name], female    DOB: 1945-11-15, 71 y.o.   MRN: [id number]  HPI 71 year old Female for Medicare Wellness and health maintenance exam as well as evaluation of medical issues.  Had total left  knee arthroplasty in January and has done well.  Taking fish oil 2 caps twice a day.  Avulsion right great toe nail June 28.  History of mitral valve prolapse and has been on beta blocker for this for many years. History of vitamin D deficiency. History of sleep apnea, hyperlipidemia, and per glucose tolerance. History of GE reflux. History of obesity and osteoarthritis. Trace mitral regurgitation and mitral valve prolapse diagnosed in 1983 on echocardiogram.  History of L4-L5 microdiscectomy October 2004. Benign right breast biopsy 1997. Tonsillectomy and adenoidectomy at age 45. Bilateral tubal ligation at age 72. Right oophorectomy D&C and uterine polypectomy 2002.  Underwent total right knee replacement February 2012. She had arthroscopic left knee 2014. Left knee arthroscopic surgery September 2010 and another left knee arthroscopic surgery October 2011. Surgery for right knee torn meniscus February 2006.  Immunizations are up-to-date with the exception of tetanus immunization which is due this year. She gets annual flu vaccine.  Sulfa causes hives.  Social history: She is a widow. Husband died of brain cancer. She sees an older gentleman frequently and sometimes a sexually active. They enjoy traveling and other activities. Social alcohol consumption. She does not smoke. 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 2 adult daughters one of whom is a Company secretary and he underwent teaches music. Several grandchildren.  Family history: Parents divorced when she was 78 years old and she does not know her father's family history. Mother died at age 28 of heart failure, kidney failure, scleroderma.  Patient is an only child. Maternal grandmother died at age 75 of abdominal aortic aneurysm rupture.  She uses C Pap for sleep apnea.         Review of Systems  Constitutional: Negative.   Respiratory: Negative.   Cardiovascular: Negative.   Gastrointestinal: Negative.   Genitourinary: Negative.   Neurological: Negative.   Psychiatric/Behavioral: Negative.        Objective:   Physical Exam  Constitutional: She is oriented to person, place, and time. She appears well-developed and well-nourished. No distress.  HENT:  Head: Normocephalic and atraumatic.  Right Ear: External ear normal.  Left Ear: External ear normal.  Mouth/Throat: Oropharynx is clear and moist. No oropharyngeal exudate.  Eyes: Pupils are equal, round, and reactive to light. Conjunctivae and EOM are normal. Right eye exhibits no discharge. Left eye exhibits no discharge. No scleral icterus.  Neck: Neck supple. No JVD present. No thyromegaly present.  Cardiovascular: Normal rate, regular rhythm, normal heart sounds and intact distal pulses.   No murmur heard. Pulmonary/Chest: Effort normal and breath sounds normal. No respiratory distress. She has no wheezes. She has no rales.  Abdominal: Soft. Bowel sounds are normal. She exhibits no distension and no mass. There is no tenderness. There is no rebound and no guarding.  Genitourinary:  Genitourinary Comments:  Pap deferred due to age  Musculoskeletal: She exhibits no edema.  Lymphadenopathy:    She has no cervical adenopathy.  Neurological: She is alert and oriented to person, place, and time. She has normal reflexes. No cranial nerve deficit. Coordination normal.  Skin: Skin is warm and dry. No rash noted. She is not diaphoretic.  Psychiatric: She has a normal mood and affect. Her behavior is normal. Judgment and thought content normal.  Vitals reviewed.         Assessment & Plan:  Bradycardia- decrease betea blocker to 25 mg daily  Status post  bilateral knee arthroplasties and doing well  History of vitamin D deficiency-continue vitamin D supplement. Level not checked today . It is not covered by Medicare.  Chronic back pain treated with Cymbalta 90 mg daily  Impaired glucose tolerance-A1c is excellent at 5.4%  History of sleep apnea  Hyperlipidemia-lipid panel normal as well as liver functions on statin therapy  Complaining of some external genital irritation and have prescribed Lotrisone cream twice daily  Essential hypertension continue losartan and to normal will be reduced to 25 mg daily  Plan: She will follow-up late July with blood pressure check and office visit with decreased dose of Tenormin.   Subjective:   Patient presents for Medicare Annual/Subsequent preventive examination.  Review Past Medical/Family/Social:See above   Risk Factors  Current exercise habits: Active with yard work Dietary issues discussed: Low fat low carbohydrate  Cardiac risk factors:Hyperlipidemia, and glucose tolerance, family history  Depression Screen  (Note: if answer to either of the following is "Yes", a more complete depression screening is indicated)   Over the past two weeks, have you felt down, depressed or hopeless? No  Over the past two weeks, have you felt little interest or pleasure in doing things? No Have you lost interest or pleasure in daily life? No Do you often feel hopeless? No Do you cry easily over simple problems? No   Activities of Daily Living  In your present state of health, do you have any difficulty performing the following activities?:   Driving? No  Managing money? No  Feeding yourself? No  Getting from bed to chair? No  Climbing a flight of stairs? No  Preparing food and eating?: No  Bathing or showering? No  Getting dressed: No  Getting to the toilet? No  Using the toilet:No  Moving around from place to place: No  In the past year have you fallen or had a near fall?:Yes once  tripped Are you sexually active? Sometimes Do you have more than one partner? No   Hearing Difficulties: No  Do you often ask people to speak up or repeat themselves? Yes Do you experience ringing or noises in your ears? Yes Do you have difficulty understanding soft or whispered voices? Yes Do you feel that you have a problem with memory? No Do you often misplace items? No    Home Safety:  Do you have a smoke alarm at your residence? Yes Do you have grab bars in the bathroom?No Do you have throw rugs in your house? Yes   Cognitive Testing  Alert? Yes Normal Appearance?Yes  Oriented to person? Yes Place? Yes  Time? Yes  Recall of three objects? Yes  Can perform simple calculations? Yes  Displays appropriate judgment?Yes  Can read the correct time from a watch face?Yes   List the Names of Other Physician/Practitioners you currently use:  See referral list for the physicians patient is currently seeing.     Review of Systems: See above  Objective:     General appearance: Appears stated age and mildly obese  Head: Normocephalic, without obvious abnormality, atraumatic  Eyes: conj clear, EOMi PEERLA  Ears: normal TM's and external ear canals both ears  Nose: Nares normal. Septum midline. Mucosa normal. No drainage or sinus tenderness.  Throat: lips, mucosa, and tongue normal; teeth and gums normal  Neck: no adenopathy, no carotid bruit, no JVD, supple, symmetrical, trachea midline and thyroid not enlarged, symmetric, no tenderness/mass/nodules  No CVA tenderness.  Lungs: clear to auscultation bilaterally  Breasts: normal appearance, no masses or tenderness Heart: regular rate and rhythm, S1, S2 normal, no murmur, click, rub or gallop  Abdomen: soft, non-tender; bowel sounds normal; no masses, no organomegaly  Musculoskeletal: ROM normal in all joints, no crepitus, no deformity, Normal muscle strengthen. Back  is symmetric, no curvature. Skin: Skin color, texture, turgor  normal. No rashes or lesions  Lymph nodes: Cervical, supraclavicular, and axillary nodes normal.  Neurologic: CN 2 -12 Normal, Normal symmetric reflexes. Normal coordination and gait  Psych: Alert & Oriented x 3, Mood appear stable.    Assessment:    Annual wellness medicare exam   Plan:    During the course of the visit the patient was educated and counseled about appropriate screening and preventive services including:   Annual mammogram  Annual flu vaccine     Patient Instructions (the written plan) was given to the patient.  Medicare Attestation  I have personally reviewed:  The patient's medical and social history  Their use of alcohol, tobacco or illicit drugs  Their current medications and supplements  The patient's functional ability including ADLs,fall risks, home safety risks, cognitive, and hearing and visual impairment  Diet and physical activities  Evidence for depression or mood disorders  The patient's weight, height, BMI, and visual acuity have been recorded in the chart. I have made referrals, counseling, and provided education to the patient based on review of the above and I have provided the patient with a written personalized care plan for preventive services.

## 2016-08-11 NOTE — Patient Instructions (Signed)
It was a pleasure to see you today. Decreased to normal at 25 mg daily and follow-up with office visit and blood pressure check late July.

## 2016-08-12 ENCOUNTER — Encounter: Payer: Self-pay | Admitting: Internal Medicine

## 2016-08-12 ENCOUNTER — Ambulatory Visit (INDEPENDENT_AMBULATORY_CARE_PROVIDER_SITE_OTHER): Payer: Medicare Other | Admitting: Internal Medicine

## 2016-08-12 ENCOUNTER — Encounter: Payer: Medicare Other | Admitting: Gynecology

## 2016-08-12 VITALS — BP 104/60 | HR 64 | Temp 98.2°F | Ht 65.0 in | Wt 192.0 lb

## 2016-08-12 DIAGNOSIS — I10 Essential (primary) hypertension: Secondary | ICD-10-CM

## 2016-08-12 DIAGNOSIS — R001 Bradycardia, unspecified: Secondary | ICD-10-CM

## 2016-08-12 NOTE — Progress Notes (Signed)
   Subjective:    Patient ID: [name], female    DOB: [birth year], 71 y.o.   MRN: [id number]  HPI At last visit which was her physical examination July 2, we agreed we would decrease her Tenormin to 25 mg daily as her pulse was 48 at that visit. We continued her on losartan.  She is here today for nurse visit and blood pressure check.    Review of Systems     Objective:   Physical Exam Blood pressure today is 104/60. Pulse is 64. She has no complaints.       Assessment & Plan:  Bradycardia resolved after Tenormin dose cut in half at 25 mg daily presently.  Plan: Continue losartan and reduced dose of Tenormin. Follow-up January 2019 for 6 month recheck

## 2016-08-12 NOTE — Patient Instructions (Signed)
Continue Tenormin 25 mg daily. Bradycardia has resolved. Continue losartan. Follow-up January 2019 which will be six-month recheck.

## 2016-08-14 ENCOUNTER — Encounter: Payer: Self-pay | Admitting: Gynecology

## 2016-08-14 ENCOUNTER — Ambulatory Visit (INDEPENDENT_AMBULATORY_CARE_PROVIDER_SITE_OTHER): Payer: Medicare Other | Admitting: Gynecology

## 2016-08-14 VITALS — BP 110/68 | Ht 65.0 in | Wt 191.0 lb

## 2016-08-14 DIAGNOSIS — Z01411 Encounter for gynecological examination (general) (routine) with abnormal findings: Secondary | ICD-10-CM | POA: Diagnosis not present

## 2016-08-14 DIAGNOSIS — N952 Postmenopausal atrophic vaginitis: Secondary | ICD-10-CM | POA: Diagnosis not present

## 2016-08-14 NOTE — Progress Notes (Signed)
    [name] [birth year] [id number]        71 y.o.  G2P2002 for annual exam.    Past medical history,surgical history, problem list, medications, allergies, family history and social history were all reviewed and documented as reviewed in the EPIC chart.  ROS:  Performed with pertinent positives and negatives included in the history, assessment and plan.   Additional significant findings :  None   Exam: Wandra Scot assistant Vitals:   08/14/16 1203  BP: 110/68  Weight: 191 lb (86.6 kg)  Height: 5\' 5"  (1.651 m)   Body mass index is 31.78 kg/m.  General appearance:  Normal affect, orientation and appearance. Skin: Grossly normal HEENT: Without gross lesions.  No cervical or supraclavicular adenopathy. Thyroid normal.  Lungs:  Clear without wheezing, rales or rhonchi Cardiac: RR, without RMG Abdominal:  Soft, nontender, without masses, guarding, rebound, organomegaly or hernia Breasts:  Examined lying and sitting without masses, retractions, discharge or axillary adenopathy. Pelvic:  Ext, BUS, Vagina: With atrophic changes  Cervix: With atrophic changes. Pap smear done  Uterus: Anteverted, normal size, shape and contour, midline and mobile nontender   Adnexa: Without masses or tenderness    Anus and perineum: Normal   Rectovaginal: Normal sphincter tone without palpated masses or tenderness.    Assessment/Plan:  71 y.o. [id number] female for annual exam.   1. Postmenopausal/atrophic genital changes. No significant hot flushes, night sweats, vaginal dryness or any vaginal bleeding. Continue to monitor report any issues or bleeding. 2. Pap smear 2015. Pap smear done today. No history of abnormal Pap smears. Options to stop screening per current screening guidelines based on age reviewed. 3. Mammography 06/2016. Breast exam normal today. Continue with annual mammography when due. SBE monthly reviewed. 4. Colonoscopy 2017. Repeat at their recommended interval. 5. DEXA 2015  normal. Plan repeat DEXA at 5 year interval. Increase calcium vitamin D. 6. Health maintenance. No routine lab work done as patient reports this done elsewhere. Follow up 1 year, sooner as needed.   Anastasio Auerbach MD, 12:36 PM 08/14/2016

## 2016-08-14 NOTE — Patient Instructions (Signed)
Followup in one year for annual exam, sooner if any issues 

## 2016-08-14 NOTE — Addendum Note (Signed)
Addended by: Burnett Kanaris on: 08/14/2016 12:50 PM   Modules accepted: Orders

## 2016-08-16 LAB — PAP IG W/ RFLX HPV ASCU

## 2016-08-19 ENCOUNTER — Encounter: Payer: Self-pay | Admitting: Gynecology

## 2016-08-19 ENCOUNTER — Other Ambulatory Visit: Payer: Self-pay | Admitting: Gynecology

## 2016-08-19 ENCOUNTER — Ambulatory Visit (INDEPENDENT_AMBULATORY_CARE_PROVIDER_SITE_OTHER): Payer: Medicare Other | Admitting: Gynecology

## 2016-08-19 VITALS — BP 116/70

## 2016-08-19 DIAGNOSIS — N3000 Acute cystitis without hematuria: Secondary | ICD-10-CM

## 2016-08-19 LAB — URINALYSIS W MICROSCOPIC + REFLEX CULTURE
BILIRUBIN URINE: NEGATIVE
Casts: NONE SEEN [LPF]
Crystals: NONE SEEN [HPF]
Glucose, UA: NEGATIVE
NITRITE: POSITIVE — AB
PH: 6 (ref 5.0–8.0)
Specific Gravity, Urine: 1.02 (ref 1.001–1.035)
Yeast: NONE SEEN [HPF]

## 2016-08-19 MED ORDER — NITROFURANTOIN MONOHYD MACRO 100 MG PO CAPS: 100.0000 mg | ORAL_CAPSULE | Freq: Two times a day (BID) | ORAL | 0 refills | Status: DC

## 2016-08-19 NOTE — Patient Instructions (Signed)
Take the Macrodantin antibiotic twice daily for 7 days. Follow up if your urinary symptoms continue, worsen or recur.

## 2016-08-19 NOTE — Addendum Note (Signed)
Addended by: Nelva Nay on: 08/19/2016 03:21 PM   Modules accepted: Orders

## 2016-08-19 NOTE — Progress Notes (Signed)
    [name] 28-Jun-1945 [id number]        71 y.o.  G2P2002 presents with several days of worsening frequency, dysuria and urgency. No low back pain fever or chills. No vaginal discharge irritation or odor. No nausea vomiting diarrhea constipation.  Past medical history,surgical history, problem list, medications, allergies, family history and social history were all reviewed and documented in the EPIC chart.  Directed ROS with pertinent positives and negatives documented in the history of present illness/assessment and plan.  Exam: Vitals:   08/19/16 1113  BP: 116/70   General appearance:  Normal Spine straight without CVA tenderness Abdomen soft nontender without masses guarding rebound  Assessment/Plan:  71 y.o. [id number] with history and urinalysis consistent with UTI. Will treat with Macrobid 100 mg twice a day 7 days. Patient will follow up if her symptoms persist, worsen or recur.    Anastasio Auerbach MD, 11:48 AM 08/19/2016

## 2016-08-22 LAB — URINE CULTURE

## 2016-09-05 ENCOUNTER — Other Ambulatory Visit: Payer: Self-pay | Admitting: Internal Medicine

## 2016-10-24 DIAGNOSIS — D164 Benign neoplasm of bones of skull and face: Secondary | ICD-10-CM | POA: Diagnosis not present

## 2016-11-01 ENCOUNTER — Ambulatory Visit (INDEPENDENT_AMBULATORY_CARE_PROVIDER_SITE_OTHER): Payer: Medicare Other | Admitting: Internal Medicine

## 2016-11-01 DIAGNOSIS — Z23 Encounter for immunization: Secondary | ICD-10-CM

## 2016-11-01 NOTE — Progress Notes (Signed)
Flu shot given

## 2016-11-01 NOTE — Patient Instructions (Signed)
Flu shot given

## 2016-11-04 DIAGNOSIS — K045 Chronic apical periodontitis: Secondary | ICD-10-CM | POA: Diagnosis not present

## 2016-11-04 DIAGNOSIS — R6884 Jaw pain: Secondary | ICD-10-CM | POA: Diagnosis not present

## 2016-11-09 DIAGNOSIS — K045 Chronic apical periodontitis: Secondary | ICD-10-CM | POA: Diagnosis not present

## 2016-11-13 DIAGNOSIS — M79672 Pain in left foot: Secondary | ICD-10-CM | POA: Diagnosis not present

## 2016-11-13 DIAGNOSIS — M7672 Peroneal tendinitis, left leg: Secondary | ICD-10-CM | POA: Diagnosis not present

## 2016-12-01 ENCOUNTER — Other Ambulatory Visit: Payer: Self-pay | Admitting: Internal Medicine

## 2017-01-02 DIAGNOSIS — M7672 Peroneal tendinitis, left leg: Secondary | ICD-10-CM | POA: Diagnosis not present

## 2017-01-02 DIAGNOSIS — M79672 Pain in left foot: Secondary | ICD-10-CM | POA: Diagnosis not present

## 2017-01-11 DIAGNOSIS — M79672 Pain in left foot: Secondary | ICD-10-CM | POA: Diagnosis not present

## 2017-01-11 DIAGNOSIS — M25572 Pain in left ankle and joints of left foot: Secondary | ICD-10-CM | POA: Diagnosis not present

## 2017-01-18 DIAGNOSIS — M6530 Trigger finger, unspecified finger: Secondary | ICD-10-CM | POA: Insufficient documentation

## 2017-01-18 DIAGNOSIS — M79672 Pain in left foot: Secondary | ICD-10-CM | POA: Insufficient documentation

## 2017-01-20 DIAGNOSIS — M7672 Peroneal tendinitis, left leg: Secondary | ICD-10-CM | POA: Diagnosis not present

## 2017-01-20 DIAGNOSIS — M79672 Pain in left foot: Secondary | ICD-10-CM | POA: Diagnosis not present

## 2017-02-28 ENCOUNTER — Ambulatory Visit (INDEPENDENT_AMBULATORY_CARE_PROVIDER_SITE_OTHER): Payer: Medicare Other | Admitting: Internal Medicine

## 2017-02-28 ENCOUNTER — Encounter: Payer: Self-pay | Admitting: Internal Medicine

## 2017-02-28 VITALS — BP 102/70 | HR 63 | Temp 98.3°F | Ht 65.0 in | Wt 201.0 lb

## 2017-02-28 DIAGNOSIS — J069 Acute upper respiratory infection, unspecified: Secondary | ICD-10-CM

## 2017-02-28 MED ORDER — LEVOFLOXACIN 500 MG PO TABS: 500.0000 mg | ORAL_TABLET | Freq: Every day | ORAL | 0 refills | Status: DC

## 2017-02-28 MED ORDER — HYDROCODONE-HOMATROPINE 5-1.5 MG/5ML PO SYRP: 5.0000 mL | ORAL_SOLUTION | Freq: Three times a day (TID) | ORAL | 0 refills | Status: DC | PRN

## 2017-02-28 NOTE — Progress Notes (Signed)
   Subjective:    Patient ID: [name], female    DOB: July 18, 1945, 72 y.o.   MRN: [id number]  HPI Low grade fever 100 degrees and cough. Started with URI symptoms for some 2 weeks but got worse over the past few days. Has earache and headache.  Is going to help her gentleman friend move to [employer] this coming Monday.    Review of Systems see above     Objective:   Physical Exam  Constitutional: She appears well-developed and well-nourished. No distress.  HENT:  Pharynx   Pulmonary/Chest: Effort normal and breath sounds normal. No respiratory distress. She has no wheezes.  Lymphadenopathy:    She has no cervical adenopathy.  Skin: Skin is warm and dry. She is not diaphoretic.  Vitals reviewed.         Assessment & Plan:  Acute URI  Plan: Levaquin 500 mg daily for 10 days.  Hycodan 1 teaspoon p.o. every 8 hours as needed cough.  Rest and drink plenty of fluids.

## 2017-03-03 NOTE — Patient Instructions (Signed)
Rest and drink plenty of fluids.  Levaquin 500 mg daily for 10 days.  Hycodan 1 teaspoon p.o. every 8 hours as needed cough.

## 2017-03-11 ENCOUNTER — Ambulatory Visit (INDEPENDENT_AMBULATORY_CARE_PROVIDER_SITE_OTHER): Payer: Medicare Other | Admitting: Internal Medicine

## 2017-03-11 ENCOUNTER — Encounter: Payer: Self-pay | Admitting: Internal Medicine

## 2017-03-11 VITALS — BP 104/58 | HR 64 | Temp 98.5°F | Ht 65.0 in | Wt 206.0 lb

## 2017-03-11 DIAGNOSIS — J22 Unspecified acute lower respiratory infection: Secondary | ICD-10-CM

## 2017-03-11 DIAGNOSIS — J40 Bronchitis, not specified as acute or chronic: Secondary | ICD-10-CM | POA: Diagnosis not present

## 2017-03-11 MED ORDER — PREDNISONE 10 MG PO TABS: ORAL_TABLET | ORAL | 0 refills | Status: DC

## 2017-03-11 MED ORDER — CLARITHROMYCIN 500 MG PO TABS: 500.0000 mg | ORAL_TABLET | Freq: Two times a day (BID) | ORAL | 0 refills | Status: DC

## 2017-03-11 MED ORDER — CEFTRIAXONE SODIUM 1 G IJ SOLR
1.0000 g | Freq: Once | INTRAMUSCULAR | Status: AC
  Administered 2017-03-11: 1 g via INTRAMUSCULAR

## 2017-03-11 NOTE — Patient Instructions (Addendum)
Biaxin 500 mg twice daily x 10 days. Prednisone 10 mg to take in tapering course as directed 6-5-4-3-2-1.  Rocephin 1 g IM.

## 2017-03-11 NOTE — Progress Notes (Addendum)
   Subjective:    Patient ID: [name], female    DOB: 31-Oct-1945, 72 y.o.   MRN: [id number]  HPI She was seen the 15th with a cough.  Was treated with Levaquin and Hycodan.  She read a warning about tendinitis with Levaquin.  She stopped taking it.  Says it caused insomnia and kept her up for 2 nights.  She had some leftover Amoxicillin from a dentist and took 500 mg daily for a few days.  Her cough is not improved.  She says it is productive.  No fever or shaking chills.  She moved her friend in the running.  She has not really stop to rest.  She has to work at the car wash next week.  Then she is going to the beach on March 9.  She thinks Hycodan caused her to itch but she has had it before.  She is  unhappy that she is not better.  She has a deep congested cough.  Back in 2016 she was treated with Levaquin which she tolerated fine but we changed her to Biaxin and gave her steroids when she had protracted asthmatic bronchitis.  We gave her an albuterol inhaler at that time which she says cost $300 and she does not want an inhaler.    Review of Systems     Objective:   Physical Exam Skin warm and dry.  Nodes none.  TMs are clear.  Pharynx is clear.  She has a deep congested cough that sounds like she has loose congestion but her chest is completely clear to auscultation without rales or wheezing.       Assessment & Plan:  Acute lower respiratory infection  Plan: 1 g IM Rocephin.  Prednisone and tapering course going from 60 mg to 0 mg over 7 days.  Biaxin 500 mg twice daily for 10 days.  Try to rest and drink plenty of fluids.  Stop statin medication while taking Biaxin

## 2017-04-15 ENCOUNTER — Other Ambulatory Visit: Payer: Self-pay

## 2017-04-15 MED ORDER — DULOXETINE HCL 30 MG PO CPEP: 30.0000 mg | ORAL_CAPSULE | Freq: Every day | ORAL | 0 refills | Status: DC

## 2017-05-25 ENCOUNTER — Other Ambulatory Visit: Payer: Self-pay | Admitting: Internal Medicine

## 2017-06-04 ENCOUNTER — Other Ambulatory Visit: Payer: Self-pay | Admitting: Gynecology

## 2017-06-04 DIAGNOSIS — Z1231 Encounter for screening mammogram for malignant neoplasm of breast: Secondary | ICD-10-CM

## 2017-07-04 ENCOUNTER — Ambulatory Visit: Payer: Medicare Other

## 2017-07-22 ENCOUNTER — Ambulatory Visit
Admission: RE | Admit: 2017-07-22 | Discharge: 2017-07-22 | Disposition: A | Discharge status: Home / Self Care | Payer: Medicare Other | Source: Ambulatory Visit | Attending: Gynecology | Admitting: Gynecology

## 2017-07-22 DIAGNOSIS — Z1231 Encounter for screening mammogram for malignant neoplasm of breast: Secondary | ICD-10-CM

## 2017-07-25 ENCOUNTER — Telehealth: Payer: Self-pay

## 2017-07-25 NOTE — Telephone Encounter (Signed)
Left message to call to book CPE.

## 2017-08-18 ENCOUNTER — Ambulatory Visit (INDEPENDENT_AMBULATORY_CARE_PROVIDER_SITE_OTHER): Payer: Medicare Other | Admitting: Gynecology

## 2017-08-18 ENCOUNTER — Encounter: Payer: Self-pay | Admitting: Gynecology

## 2017-08-18 VITALS — BP 124/74 | Ht 65.0 in | Wt 189.0 lb

## 2017-08-18 DIAGNOSIS — Z9189 Other specified personal risk factors, not elsewhere classified: Secondary | ICD-10-CM

## 2017-08-18 DIAGNOSIS — N952 Postmenopausal atrophic vaginitis: Secondary | ICD-10-CM

## 2017-08-18 DIAGNOSIS — Z01419 Encounter for gynecological examination (general) (routine) without abnormal findings: Secondary | ICD-10-CM

## 2017-08-18 NOTE — Patient Instructions (Signed)
Follow-up in 1 year for annual exam, sooner if any issues. 

## 2017-08-18 NOTE — Progress Notes (Signed)
    [name] 1945/10/19 [id number]        71 y.o.  [id number] for breast and pelvic exam.  Doing well without complaints.  Past medical history,surgical history, problem list, medications, allergies, family history and social history were all reviewed and documented as reviewed in the EPIC chart.  ROS:  Performed with pertinent positives and negatives included in the history, assessment and plan.   Additional significant findings : None   Exam: [name] assistant Vitals:   08/18/17 1141  BP: 124/74  Weight: 189 lb (85.7 kg)  Height: 5\' 5"  (1.651 m)   Body mass index is 31.45 kg/m.  General appearance:  Normal affect, orientation and appearance. Skin: Grossly normal HEENT: Without gross lesions.  No cervical or supraclavicular adenopathy. Thyroid normal.  Lungs:  Clear without wheezing, rales or rhonchi Cardiac: RR, without RMG Abdominal:  Soft, nontender, without masses, guarding, rebound, organomegaly or hernia Breasts:  Examined lying and sitting without masses, retractions, discharge or axillary adenopathy. Pelvic:  Ext, BUS, Vagina: With atrophic changes  Cervix: With atrophic changes  Uterus: Anteverted, normal size, shape and contour, midline and mobile nontender   Adnexa: Without masses or tenderness    Anus and perineum: Normal   Rectovaginal: Normal sphincter tone without palpated masses or tenderness.    Assessment/Plan:  72 y.o. [id number] female for breast and pelvic exam.   1. Postmenopausal/atrophic genital changes.  No significant menopausal symptoms or any vaginal bleeding. 2. Pap smear 2018.  No Pap smear done today.  No history of abnormal Pap smears.  Options to stop screening per current screening guidelines based on age reviewed. 3. Colonoscopy 2017.  Repeat at their recommended interval. 4. DEXA 2015 normal.  Plan repeat DEXA next year at 5-year interval. 5. Mammography 07/2017.  Continue with annual mammography next year.  Breast exam normal  today. 6. Health maintenance.  No routine lab work done as patient reports this done elsewhere.  Follow-up 1 year, sooner as needed.   Paula Auerbach MD, 12:15 PM 08/18/2017

## 2017-08-26 ENCOUNTER — Encounter: Payer: Self-pay | Admitting: Internal Medicine

## 2017-08-26 ENCOUNTER — Ambulatory Visit (INDEPENDENT_AMBULATORY_CARE_PROVIDER_SITE_OTHER): Payer: Medicare Other | Admitting: Internal Medicine

## 2017-08-26 VITALS — BP 102/50 | HR 54 | Temp 98.1°F

## 2017-08-26 DIAGNOSIS — S61209A Unspecified open wound of unspecified finger without damage to nail, initial encounter: Secondary | ICD-10-CM | POA: Diagnosis not present

## 2017-08-26 DIAGNOSIS — S61211A Laceration without foreign body of left index finger without damage to nail, initial encounter: Secondary | ICD-10-CM

## 2017-08-26 DIAGNOSIS — Z23 Encounter for immunization: Secondary | ICD-10-CM

## 2017-08-26 NOTE — Progress Notes (Signed)
   Subjective:    Patient ID: [name], female    DOB: 02/19/1945, 72 y.o.   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  Tetanus immunization update given today.  Not having a lot of pain but area keeps bleeding.    Review of Systems     Objective:   Physical Exam  She has partial avulsion tip of left index finger.  This was dressed in sterile fashion with a tight bandage to control bleeding.  Hand surgeon was contacted.  Tetanus immunization update given.      Assessment & Plan:  Avulsion left index finger  Plan: Refer to hand surgeon for evaluation and treatment.  Tetanus immunization update given.

## 2017-08-26 NOTE — Patient Instructions (Signed)
Refer to Copy.  Tetanus immunization update given.

## 2017-09-05 ENCOUNTER — Encounter (HOSPITAL_COMMUNITY): Payer: Self-pay | Admitting: *Deleted

## 2017-09-05 ENCOUNTER — Emergency Department (HOSPITAL_COMMUNITY): Payer: Medicare Other

## 2017-09-05 ENCOUNTER — Other Ambulatory Visit: Payer: Self-pay

## 2017-09-05 ENCOUNTER — Other Ambulatory Visit: Payer: Self-pay | Admitting: Internal Medicine

## 2017-09-05 ENCOUNTER — Emergency Department (HOSPITAL_COMMUNITY)
Admission: EM | Admit: 2017-09-05 | Discharge: 2017-09-05 | Disposition: A | Discharge status: Home / Self Care | Payer: Medicare Other | Attending: Emergency Medicine | Admitting: Emergency Medicine

## 2017-09-05 DIAGNOSIS — W1830XA Fall on same level, unspecified, initial encounter: Secondary | ICD-10-CM | POA: Insufficient documentation

## 2017-09-05 DIAGNOSIS — Y9301 Activity, walking, marching and hiking: Secondary | ICD-10-CM | POA: Insufficient documentation

## 2017-09-05 DIAGNOSIS — Y929 Unspecified place or not applicable: Secondary | ICD-10-CM | POA: Insufficient documentation

## 2017-09-05 DIAGNOSIS — Y999 Unspecified external cause status: Secondary | ICD-10-CM | POA: Diagnosis not present

## 2017-09-05 DIAGNOSIS — Z79899 Other long term (current) drug therapy: Secondary | ICD-10-CM | POA: Diagnosis not present

## 2017-09-05 DIAGNOSIS — W19XXXA Unspecified fall, initial encounter: Secondary | ICD-10-CM

## 2017-09-05 DIAGNOSIS — S0083XA Contusion of other part of head, initial encounter: Secondary | ICD-10-CM | POA: Insufficient documentation

## 2017-09-05 DIAGNOSIS — Z043 Encounter for examination and observation following other accident: Secondary | ICD-10-CM | POA: Diagnosis present

## 2017-09-05 DIAGNOSIS — R51 Headache: Secondary | ICD-10-CM | POA: Insufficient documentation

## 2017-09-05 DIAGNOSIS — S0003XA Contusion of scalp, initial encounter: Secondary | ICD-10-CM | POA: Diagnosis not present

## 2017-09-05 DIAGNOSIS — S0993XA Unspecified injury of face, initial encounter: Secondary | ICD-10-CM | POA: Diagnosis not present

## 2017-09-05 NOTE — ED Provider Notes (Signed)
[facility] DEPT Provider Note   CSN: [id number] Arrival date & time: 09/05/17  1234     History   Chief Complaint Chief Complaint  Patient presents with  . Facial Injury    HPI [name] is a 72 y.o. female here for evaluation of facial injury after mechanical fall.  Patient was seen at [facility] urgent care and was recommended she came to ER for further imaging.  Patient was walking when she tripped and fell forward landing on cement ground directly on her forehead/nose.  She had sudden onset, severe achy non radiating pain to her forehead and nose followed with heavy nasal bleeding bilaterally.  She originally had a frontals, local headache and eventually slowly gradually developed global headache.  Currently denies headache.  She does not take any anticoagulants.  There was no LOC, nausea, vomiting, seizure-like activity, vision changes after the fall.  She drove herself to the ER.  Epistaxis has stopped with nasal packing and pressure.  HPI  Past Medical History:  Diagnosis Date  . Arthritis   . Bronchitis   . Genital HSV 03/2013   Positive HSV 1 genital PCR  . Heart murmur   . Hyperlipidemia   . Hypertension    pt states it is pre-hypertention  . Mitral valve prolapse    since age 48  . Overactive bladder   . Ringing in ears 2010   interfers with clarity of hearing at times  . Sleep apnea    mild -never tolerated cpap  . Stress incontinence in female    wears a pad    Patient Active Problem List   Diagnosis Date Noted  . Left knee DJD 01/25/2016  . Pre-hypertension 10/20/2015  . Primary osteoarthritis of left knee 01/24/2012  . Decreased libido 11/05/2011  . Osteoarthritis of left knee 11/11/2010  . Impaired glucose tolerance 11/11/2010  . Vitamin D deficiency 11/11/2010  . Obesity 07/14/2010  . Mitral valve prolapse 07/14/2010  . Hyperlipemia 10/01/2007  . OBSTRUCTIVE SLEEP APNEA 10/01/2007    Past  Surgical History:  Procedure Laterality Date  . BACK SURGERY    . BREAST BIOPSY Left 08/07/2015   benign  . BREAST EXCISIONAL BIOPSY Bilateral 01/27/1996   benign  . BREAST SURGERY     BENIGN RIGHT BR MASS,Bx's X 2  . FOOT SURGERY  2013   CORRECTION OF "HAMMER TOES" -LEFT FOOT  . HAND SURGERY     RIGHT THUMB BONE SPUR  . JOINT REPLACEMENT    . KNEE ARTHROSCOPY  2006, 2010, 2012   LEFT KNEE IN  2010/ RIGHT KNEE IN 2006 AND 2012  . KNEE ARTHROSCOPY  01/24/2012   Procedure: ARTHROSCOPY KNEE;  Surgeon: Johnn Hai, MD;  Location: [facility];  Service: Orthopedics;  Laterality: Left;  left knee arthroscopy with debridment, partial lateral menisectomy  . OOPHORECTOMY     RIGHT OVARY REMOVED  . SPINE SURGERY    . TONSILLECTOMY    . TOTAL KNEE ARTHROPLASTY  2012   Right  . TOTAL KNEE ARTHROPLASTY Left 01/25/2016   Procedure: LEFT TOTAL KNEE ARTHROPLASTY, EVALUATION UNDER ANESTHESIA AND MANIPULATION UNDER ANESTHESIA;  Surgeon: Susa Day, MD;  Location: [facility];  Service: Orthopedics;  Laterality: Left;  . TUBAL LIGATION       OB History    Gravida  2   Para  2   Term  2   Preterm      AB  Living  2     SAB      TAB      Ectopic      Multiple      Live Births               Home Medications    Prior to Admission medications   Medication Sig Start Date End Date Taking? Authorizing Provider  acetaminophen (TYLENOL) 500 MG tablet Take 1,000 mg by mouth every 6 (six) hours as needed (for pain).   Yes [provider]  ALPHA-LIPOIC ACID PO Take 1 tablet by mouth daily.   Yes [provider]  atenolol (TENORMIN) 50 MG tablet TAKE 1 TABLET BY MOUTH EVERY DAY 06/10/16  Yes Baxley, Cresenciano Lick, MD  calcium-vitamin D (OSCAL WITH D) 500-200 MG-UNIT tablet Take 1 tablet by mouth at bedtime.   Yes [provider]  cholecalciferol (VITAMIN D) 1000 units tablet Take 2,000 Units by mouth at bedtime.    Yes [provider]    clobetasol cream (TEMOVATE) 1.28 % Apply 1 application topically 2 (two) times daily as needed (for irritation/dry skin).  04/07/14  Yes [provider]  Coenzyme Q10 (CO Q 10 PO) Take 1 tablet by mouth daily.   Yes [provider]  desoximetasone (TOPICORT) 0.25 % cream Apply 1 application topically 2 (two) times daily as needed (for dry skin/itching).  09/08/12  Yes [provider]  docusate sodium (COLACE) 250 MG capsule Take 250 mg by mouth at bedtime.   Yes [provider]  DULoxetine (CYMBALTA) 30 MG capsule Take 1 capsule (30 mg total) by mouth daily. 04/15/17  Yes Baxley, Cresenciano Lick, MD  DULoxetine (CYMBALTA) 60 MG capsule TAKE 1 CAPSULE BY MOUTH EVERY DAY 09/05/17  Yes Baxley, Cresenciano Lick, MD  fluticasone (CUTIVATE) 0.005 % ointment Apply 1 application topically 2 (two) times daily as needed (for dry skin/irritation.).  09/08/12  Yes [provider]  ketoconazole (NIZORAL) 2 % cream Apply topically daily. 12/07/13  Yes Elby Showers, MD  losartan (COZAAR) 50 MG tablet TAKE 1 TABLET BY MOUTH EVERY DAY 05/25/17  Yes Baxley, Cresenciano Lick, MD  magnesium gluconate (MAGONATE) 500 MG tablet Take 500 mg by mouth at bedtime.   Yes [provider]  Omega-3 Fatty Acids (FISH OIL PO) Take 2 capsules by mouth 2 (two) times daily.    Yes [provider]  simvastatin (ZOCOR) 20 MG tablet TAKE 1 TABLET BY MOUTH EVERY DAY Patient taking differently: Take 20 mg by mouth at bedtime.  05/25/17  Yes Baxley, Cresenciano Lick, MD  triamcinolone ointment (KENALOG) 0.1 % Apply 1 application topically 2 (two) times daily as needed (for skin irritation/dry skin).  04/02/14  Yes [provider]  TURMERIC PO Take 1 tablet by mouth at bedtime.    Yes [provider]  VOLTAREN 1 % GEL Apply 2 g topically 2 (two) times daily.  11/05/12  Yes [provider]    Family History Family History  Problem Relation Age of Onset  . Hypertension Mother   . Heart disease  Mother   . Kidney failure Mother   . Other Mother        sclaraderma  . Diabetes Maternal Grandmother        TYPE 2  . Hypertension Maternal Grandmother   . Other Maternal Grandmother        abdominal anersym  . Heart attack Father     Social History Social History   Tobacco Use  .  Smoking status: Never Smoker  . Smokeless tobacco: Never Used  Substance Use Topics  . Alcohol use: Yes    Alcohol/week: 2.0 standard drinks    Types: 2 Glasses of wine per week    Comment: wine socially  . Drug use: No     Allergies   Sulfa antibiotics; Codeine; Morphine; and Sulfonamide derivatives   Review of Systems Review of Systems  HENT: Positive for nosebleeds.   Skin: Positive for wound.  Neurological: Positive for headaches (resolved).  All other systems reviewed and are negative.    Physical Exam Updated Vital Signs BP 137/77 (BP Location: Right Arm)   Pulse (!) 57   Temp 97.8 F (36.6 C) (Oral)   Resp 16   SpO2 100%   Physical Exam  Constitutional: She is oriented to person, place, and time. She appears well-developed and well-nourished. She is cooperative. She is easily aroused. No distress.  HENT:  Head: Head is with abrasion and with contusion.  Nose: Sinus tenderness present.  Hematoma to central forehead with surrounding ecchymosis, appropriately tender without surrounding crepitus, defects or skin injury.  Deviation to nasal bridge noted to the left with tiny nasal bridge abrasion, hemostatic. Dried up blood noted in bilateral nares.  No Raccoon's eyes. No Battle's sign. No hemotympanum or otorrhea, bilaterally. No epistaxis or rhinorrhea, septum midline.  No intraoral bleeding or injury. No malocclusion.   Eyes: Conjunctivae are normal.  Lids normal. EOMs and PERRL intact.   Neck:  C-spine: no midline or paraspinal muscular tenderness. Full active ROM of cervical spine w/o pain. Trachea midline  Cardiovascular: Normal rate, regular rhythm, S1 normal, S2  normal and normal heart sounds. Exam reveals no distant heart sounds.  Pulses:      Carotid pulses are 2+ on the right side, and 2+ on the left side.      Radial pulses are 2+ on the right side, and 2+ on the left side.       Dorsalis pedis pulses are 2+ on the right side, and 2+ on the left side.  2+ radial and DP pulses bilaterally  Pulmonary/Chest: Effort normal and breath sounds normal. She has no decreased breath sounds.  No anterior/posterior thorax tenderness. Equal and symmetric chest wall expansion   Abdominal: Normal appearance.  Musculoskeletal: Normal range of motion. She exhibits no deformity.  T-spine: no paraspinal muscular tenderness or midline tenderness.   L-spine: no paraspinal muscular or midline tenderness.   Neurological: She is alert, oriented to person, place, and time and easily aroused.  Speech is fluent without obvious dysarthria or dysphasia. Strength 5/5 with hand grip and ankle F/E.   Sensation to light touch intact in hands and feet. Normal gait. No pronator drift. No leg drop.  Normal finger-to-nose and finger tapping.  CN I, II and VIII not tested. CN II-XII grossly intact bilaterally.   Skin: Skin is warm and dry. Capillary refill takes less than 2 seconds.  Psychiatric: Her behavior is normal. Thought content normal.     ED Treatments / Results  Labs (all labs ordered are listed, but only abnormal results are displayed) Labs Reviewed - No data to display  EKG None  Radiology Ct Head Wo Contrast  Result Date: 09/05/2017 CLINICAL DATA:  Facial injury after fall. EXAM: CT HEAD WITHOUT CONTRAST CT MAXILLOFACIAL WITHOUT CONTRAST TECHNIQUE: Multidetector CT imaging of the head and maxillofacial structures were performed using the standard protocol without intravenous contrast. Multiplanar CT image reconstructions of the maxillofacial structures were also generated.  COMPARISON:  None. FINDINGS: CT HEAD FINDINGS Brain: No evidence of acute infarction,  hemorrhage, hydrocephalus, extra-axial collection or mass lesion/mass effect. Vascular: No hyperdense vessel or unexpected calcification. Skull: Normal. Negative for fracture or focal lesion. Other: Small right frontal scalp hematoma is noted. CT MAXILLOFACIAL FINDINGS Osseous: No fracture or mandibular dislocation. No destructive process. Orbits: Negative. No traumatic or inflammatory finding. Sinuses: Clear. Soft tissues: Negative. IMPRESSION: Small right frontal scalp hematoma. No acute intracranial abnormality seen. No significant abnormality seen in the maxillofacial region. Electronically Signed   By: Marijo Conception, M.D.   On: 09/05/2017 15:19   Ct Maxillofacial Wo Contrast  Result Date: 09/05/2017 CLINICAL DATA:  Facial injury after fall. EXAM: CT HEAD WITHOUT CONTRAST CT MAXILLOFACIAL WITHOUT CONTRAST TECHNIQUE: Multidetector CT imaging of the head and maxillofacial structures were performed using the standard protocol without intravenous contrast. Multiplanar CT image reconstructions of the maxillofacial structures were also generated. COMPARISON:  None. FINDINGS: CT HEAD FINDINGS Brain: No evidence of acute infarction, hemorrhage, hydrocephalus, extra-axial collection or mass lesion/mass effect. Vascular: No hyperdense vessel or unexpected calcification. Skull: Normal. Negative for fracture or focal lesion. Other: Small right frontal scalp hematoma is noted. CT MAXILLOFACIAL FINDINGS Osseous: No fracture or mandibular dislocation. No destructive process. Orbits: Negative. No traumatic or inflammatory finding. Sinuses: Clear. Soft tissues: Negative. IMPRESSION: Small right frontal scalp hematoma. No acute intracranial abnormality seen. No significant abnormality seen in the maxillofacial region. Electronically Signed   By: Marijo Conception, M.D.   On: 09/05/2017 15:19    Procedures Procedures (including critical care time)  Medications Ordered in ED Medications - No data to display   Initial  Impression / Assessment and Plan / ED Course  I have reviewed the triage vital signs and the nursing notes.  Pertinent labs & imaging results that were available during my care of the patient were reviewed by me and considered in my medical decision making (see chart for details).    Exam is reassuring as above.  No significant signs or symptoms of significant CT L-spine, chest, abdominal, pelvis or extremity injury.  Other than age, patient is low risk.  She is neurologically intact.  No active bleeding.  C-spine cleared with Nexus criteria.  CT head/maxillofacial obtained which were negative other than small hematoma to the forehead.  I do not think there is indication for further emergent lab work, imaging or admission.  Will discharge with symptomatic management.  Discussed return precautions.  Patient is in agreement.  Final Clinical Impressions(s) / ED Diagnoses   Final diagnoses:  Traumatic hematoma of forehead, initial encounter  Fall, initial encounter    ED Discharge Orders    None       Arlean Hopping 09/05/17 1532    Dorie Rank, MD 09/05/17 [phone]

## 2017-09-05 NOTE — ED Triage Notes (Signed)
Pt lost her footing when trying to pick something up. Pt fell and hit her face on the concrete. Pt went to [facility] and had bleeding controlled. [facility] sent her here for imaging and to rule out internal bleeding.

## 2017-09-05 NOTE — Discharge Instructions (Addendum)
You were seen in the ER for evaluation after fall.  CT scan of head and face show a small hematoma to your right forehead but no other acute injuries.  There was no signs of internal bleed or nasal fractures.  You may develop some bruising and swelling to the area around her nose and forehead.  Take ibuprofen and/or acetaminophen for pain.  Ice.  Return to the ER for severe, sudden headache, vision changes, nausea, vomiting, difficulty with speech or walking, one-sided numbness or heaviness.

## 2017-09-10 DIAGNOSIS — M1812 Unilateral primary osteoarthritis of first carpometacarpal joint, left hand: Secondary | ICD-10-CM | POA: Diagnosis not present

## 2017-09-10 DIAGNOSIS — M65332 Trigger finger, left middle finger: Secondary | ICD-10-CM | POA: Diagnosis not present

## 2017-09-25 ENCOUNTER — Other Ambulatory Visit: Payer: Medicare Other | Admitting: Internal Medicine

## 2017-09-25 DIAGNOSIS — R7302 Impaired glucose tolerance (oral): Secondary | ICD-10-CM | POA: Diagnosis not present

## 2017-09-25 DIAGNOSIS — I341 Nonrheumatic mitral (valve) prolapse: Secondary | ICD-10-CM

## 2017-09-25 DIAGNOSIS — G4730 Sleep apnea, unspecified: Secondary | ICD-10-CM | POA: Diagnosis not present

## 2017-09-25 DIAGNOSIS — E8881 Metabolic syndrome: Secondary | ICD-10-CM

## 2017-09-25 DIAGNOSIS — E7849 Other hyperlipidemia: Secondary | ICD-10-CM

## 2017-09-25 DIAGNOSIS — I10 Essential (primary) hypertension: Secondary | ICD-10-CM | POA: Diagnosis not present

## 2017-09-25 DIAGNOSIS — Z0000 Encounter for general adult medical examination without abnormal findings: Secondary | ICD-10-CM

## 2017-09-26 ENCOUNTER — Other Ambulatory Visit: Payer: Medicare Other | Admitting: Internal Medicine

## 2017-09-26 LAB — CBC WITH DIFFERENTIAL/PLATELET
Basophils Absolute: 38 cells/uL (ref 0–200)
Basophils Relative: 0.9 %
Eosinophils Absolute: 168 cells/uL (ref 15–500)
Eosinophils Relative: 4 %
HCT: 42 % (ref 35.0–45.0)
Hemoglobin: 13.8 g/dL (ref 11.7–15.5)
Lymphs Abs: 953 cells/uL (ref 850–3900)
MCH: 30.2 pg (ref 27.0–33.0)
MCHC: 32.9 g/dL (ref 32.0–36.0)
MCV: 91.9 fL (ref 80.0–100.0)
MONOS PCT: 8.3 %
MPV: 10.3 fL (ref 7.5–12.5)
NEUTROS PCT: 64.1 %
Neutro Abs: 2692 cells/uL (ref 1500–7800)
PLATELETS: 186 10*3/uL (ref 140–400)
RBC: 4.57 10*6/uL (ref 3.80–5.10)
RDW: 12.4 % (ref 11.0–15.0)
TOTAL LYMPHOCYTE: 22.7 %
WBC mixed population: 349 cells/uL (ref 200–950)
WBC: 4.2 10*3/uL (ref 3.8–10.8)

## 2017-09-26 LAB — MICROALBUMIN / CREATININE URINE RATIO
CREATININE, URINE: 141 mg/dL (ref 20–275)
MICROALB UR: 1.7 mg/dL
MICROALB/CREAT RATIO: 12 ug/mg{creat} (ref <30)

## 2017-09-26 LAB — COMPLETE METABOLIC PANEL WITH GFR
AG Ratio: 1.8 (calc) (ref 1.0–2.5)
ALT: 20 U/L (ref 6–29)
AST: 18 U/L (ref 10–35)
Albumin: 4.1 g/dL (ref 3.6–5.1)
Alkaline phosphatase (APISO): 55 U/L (ref 33–130)
BUN/Creatinine Ratio: 25 (calc) — ABNORMAL HIGH (ref 6–22)
BUN: 26 mg/dL — ABNORMAL HIGH (ref 7–25)
CALCIUM: 9.5 mg/dL (ref 8.6–10.4)
CO2: 28 mmol/L (ref 20–32)
CREATININE: 1.03 mg/dL — AB (ref 0.60–0.93)
Chloride: 108 mmol/L (ref 98–110)
GFR, EST NON AFRICAN AMERICAN: 54 mL/min/{1.73_m2} — AB (ref >=60)
GFR, Est African American: 63 mL/min/{1.73_m2} (ref >=60)
Globulin: 2.3 g/dL (calc) (ref 1.9–3.7)
Glucose, Bld: 103 mg/dL — ABNORMAL HIGH (ref 65–99)
POTASSIUM: 5.1 mmol/L (ref 3.5–5.3)
Sodium: 144 mmol/L (ref 135–146)
Total Bilirubin: 0.6 mg/dL (ref 0.2–1.2)
Total Protein: 6.4 g/dL (ref 6.1–8.1)

## 2017-09-26 LAB — TSH: TSH: 1.52 m[IU]/L (ref 0.40–4.50)

## 2017-09-26 LAB — LIPID PANEL
CHOLESTEROL: 165 mg/dL (ref <200)
HDL: 52 mg/dL (ref >50)
LDL Cholesterol (Calc): 98 mg/dL (calc)
Non-HDL Cholesterol (Calc): 113 mg/dL (calc) (ref <130)
Total CHOL/HDL Ratio: 3.2 (calc) (ref <5.0)
Triglycerides: 67 mg/dL (ref <150)

## 2017-09-26 LAB — HEMOGLOBIN A1C
EAG (MMOL/L): 5.8 (calc)
Hgb A1c MFr Bld: 5.3 % of total Hgb (ref <5.7)
Mean Plasma Glucose: 105 (calc)

## 2017-09-29 ENCOUNTER — Encounter: Payer: Self-pay | Admitting: Internal Medicine

## 2017-09-29 ENCOUNTER — Ambulatory Visit (INDEPENDENT_AMBULATORY_CARE_PROVIDER_SITE_OTHER): Payer: Medicare Other | Admitting: Internal Medicine

## 2017-09-29 VITALS — BP 102/70 | HR 63 | Ht 65.0 in | Wt 186.0 lb

## 2017-09-29 DIAGNOSIS — I10 Essential (primary) hypertension: Secondary | ICD-10-CM | POA: Diagnosis not present

## 2017-09-29 DIAGNOSIS — I341 Nonrheumatic mitral (valve) prolapse: Secondary | ICD-10-CM

## 2017-09-29 DIAGNOSIS — Z0000 Encounter for general adult medical examination without abnormal findings: Secondary | ICD-10-CM | POA: Diagnosis not present

## 2017-09-29 DIAGNOSIS — Z96653 Presence of artificial knee joint, bilateral: Secondary | ICD-10-CM

## 2017-09-29 DIAGNOSIS — G4733 Obstructive sleep apnea (adult) (pediatric): Secondary | ICD-10-CM

## 2017-09-29 DIAGNOSIS — R7302 Impaired glucose tolerance (oral): Secondary | ICD-10-CM

## 2017-09-29 DIAGNOSIS — E7800 Pure hypercholesterolemia, unspecified: Secondary | ICD-10-CM

## 2017-09-29 NOTE — Progress Notes (Signed)
Subjective:    Patient ID: [name], female    DOB: 11/25/1945, 72 y.o.   MRN: [id number]  HPI 72 year old Female for Medicare wellness , health maintenance and evaluation of medical issues.  She has been on a new diet recently and says she is eating healthy fats.  Aside from glucose of 103 her labs are normal.  Her creatinine is slightly elevated at 1.03.  Hemoglobin A1c is normal at 5.3%.  Lipids are normal.  TSH is normal.  Her blood pressure is excellent at 102/70.  Her weight is 186 pounds.  Her weight was 206 pounds in February 2019.  Basically is lost 20 pounds which is excellent.  She feels better.  History of total left knee replacement January 2018.  Had avulsion tip of left index finger trimming shrubbery in August seen by hand surgeon.  This is healed well.  Tetanus immunization update given at that time.  Avulsion right great toenail June 2018.  Takes fish oil.  History of mitral valve prolapse and is been on beta-blocker for this for many years.  History of sleep apnea, hyperlipidemia and impaired glucose tolerance.  History of GE reflux, obesity and osteoarthritis.  Trace mitral regurgitation and mitral valve prolapse diagnosed in 1983 on echocardiogram.  Had total right knee replacement February 2012.  Arthroscopic left knee surgery 2014.  Left knee arthroscopic surgery September 2010 and another left knee arthroscopic surgery October 2011.  Surgery for right knee torn meniscus February 2006.  L4-L5 microdiscectomy October 2004.  Benign right breast biopsy 1997.  Tonsillectomy and adenoidectomy at age 6.  Bilateral tubal ligation at age 81.  Right oophorectomy D&C and uterine polypectomy 2002.  Sulfa causes hives.  Social history: She is a widow.  Husband died of brain cancer.  She sees an older gentleman frequently.  He lives in an apartment at [facility].  He no longer drives.  Social alcohol consumption.  She does not smoke.  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  2 adult daughters 1 of whom is a Company secretary and the other one teaches music.  Several grandchildren.  She uses CPAP for sleep apnea.  Family history: Parents divorced when she was 1 years old and she does not know her father's family history.  Mother died at age 10 of heart failure, kidney failure, scleroderma.  Patient is an only child.  Maternal grandmother died at age 58 of abdominal aortic aneurysm rupture.    Review of Systems no new complaints     Objective:   Physical Exam  Constitutional: She is oriented to person, place, and time. She appears well-developed and well-nourished.  HENT:  Head: Normocephalic and atraumatic.  Right Ear: External ear normal.  Left Ear: External ear normal.  Mouth/Throat: Oropharynx is clear and moist.  Eyes: Pupils are equal, round, and reactive to light. EOM are normal. Right eye exhibits no discharge. Left eye exhibits no discharge. No scleral icterus.  Neck: No JVD present. No thyromegaly present.  Cardiovascular: Normal rate, regular rhythm and normal heart sounds.  No murmur heard. Pulmonary/Chest: Effort normal and breath sounds normal. No stridor. No respiratory distress. She has no wheezes.  Breasts normal female without masses  Abdominal: She exhibits no distension and no mass. There is no tenderness. There is no rebound and no guarding. No hernia.  Genitourinary:  Genitourinary Comments: Pap  deferred due to age  Musculoskeletal: She exhibits no edema.  Lymphadenopathy:    She has no cervical adenopathy.  Neurological: She is alert and oriented to person, place, and time. She displays normal reflexes. No cranial nerve deficit. Coordination normal.  Skin: Skin is warm and dry.  Psychiatric: She has a normal mood and affect. Her behavior is normal. Judgment and thought content normal.          Assessment &  Plan:  Status post bilateral knee arthroplasties and doing well  Chronic back pain treated with Cymbalta 90 mg daily is stable  History of impaired glucose tolerance but with diet and weight loss hemoglobin A1c is now normal  History of sleep apnea  Hyperlipidemia-lipid panel is normal on statin therapy  Essential hypertension-continue current regimen  Plan: I am pleased with weight loss and normal hemoglobin A1c.  Lipids are normal.  Return in 6 months.  Subjective:   Patient presents for Medicare Annual/Subsequent preventive examination.  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 No  Over the past two weeks, have you felt little interest or pleasure in doing things? No Have you lost interest or pleasure in daily life? No Do you often feel hopeless? No Do you cry easily over simple problems? No   Activities of Daily Living  In your present state of health, do you have any difficulty performing the following activities?:   Driving? No  Managing money? No  Feeding yourself? No  Getting from bed to chair? No  Climbing a flight of stairs?  Has to go slowly due to knee issues Preparing food and eating?: No  Bathing or showering? No  Getting dressed: No  Getting to the toilet? No  Using the toilet:No  Moving around from place to place: No  In the past year have you fallen or had a near fall?:  Yes fell recently on my face but did not injure months Are you sexually active? No  Do you have more than one partner? No   Hearing Difficulties: No  Do you often ask  people to speak up or repeat themselves?  Yes Do you experience ringing or noises in your ears?  Yes Do you have difficulty understanding soft or whispered voices?  Yes Do you feel that you have a problem with memory?  Sometimes Do you often misplace items?  Sometimes   Home Safety:  Do you have a smoke alarm at your residence? Yes Do you have grab bars in the bathroom?  No Do you have throw rugs in your house?  He has   Cognitive Testing  Alert? Yes Normal Appearance?Yes  Oriented to person? Yes Place? Yes  Time? Yes  Recall of three objects? Yes  Can perform simple calculations? Yes  Displays appropriate judgment?Yes  Can read the correct time from a watch face?Yes   List the Names of Other Physician/Practitioners you currently use:  See referral list for the physicians patient is currently seeing.     Review of Systems: See above   Objective:     General appearance: Appears stated age  Head: Normocephalic, without  obvious abnormality, atraumatic  Eyes: conj clear, EOMi PEERLA  Ears: normal TM's and external ear canals both ears  Nose: Nares normal. Septum midline. Mucosa normal. No drainage or sinus tenderness.  Throat: lips, mucosa, and tongue normal; teeth and gums normal  Neck: no adenopathy, no carotid bruit, no JVD, supple, symmetrical, trachea midline and thyroid not enlarged, symmetric, no tenderness/mass/nodules  No CVA tenderness.  Lungs: clear to auscultation bilaterally  Breasts: normal appearance, no masses or tenderness Heart: regular rate and rhythm, S1, S2 normal, no murmur, click, rub or gallop  Abdomen: soft, non-tender; bowel sounds normal; no masses, no organomegaly  Musculoskeletal: ROM normal in all joints, no crepitus, no deformity, Normal muscle strengthen. Back  is symmetric, no curvature. Skin: Skin color, texture, turgor normal. No rashes or lesions  Lymph nodes: Cervical, supraclavicular, and axillary nodes normal.  Neurologic: CN 2 -12  Normal, Normal symmetric reflexes. Normal coordination and gait  Psych: Alert & Oriented x 3, Mood appear stable.    Assessment:    Annual wellness medicare exam   Plan:    During the course of the visit the patient was educated and counseled about appropriate screening and preventive services including:   Annual flu vaccine     Patient Instructions (the written plan) was given to the patient.  Medicare Attestation  I have personally reviewed:  The patient's medical and social history  Their use of alcohol, tobacco or illicit drugs  Their current medications and supplements  The patient's functional ability including ADLs,fall risks, home safety risks, cognitive, and hearing and visual impairment  Diet and physical activities  Evidence for depression or mood disorders  The patient's weight, height, BMI, and visual acuity have been recorded in the chart. I have made referrals, counseling, and provided education to the patient based on review of the above and I have provided the patient with a written personalized care plan for preventive services.

## 2017-09-29 NOTE — Patient Instructions (Addendum)
Ii is a pleasure to see you today.  Continue diet and exercise regimen.  Follow-up in 6 months.

## 2017-09-30 LAB — POCT URINALYSIS DIPSTICK
APPEARANCE: NORMAL
Bilirubin, UA: NEGATIVE
Blood, UA: NEGATIVE
Glucose, UA: NEGATIVE
KETONES UA: NEGATIVE
Leukocytes, UA: NEGATIVE
NITRITE UA: NEGATIVE
ODOR: NORMAL
PH UA: 6.5 (ref 5.0–8.0)
PROTEIN UA: NEGATIVE
Spec Grav, UA: 1.01 (ref 1.010–1.025)
UROBILINOGEN UA: 0.2 U/dL

## 2017-10-06 ENCOUNTER — Other Ambulatory Visit: Payer: Self-pay | Admitting: Internal Medicine

## 2017-10-09 ENCOUNTER — Encounter: Payer: Self-pay | Admitting: Gynecology

## 2017-10-09 ENCOUNTER — Ambulatory Visit (INDEPENDENT_AMBULATORY_CARE_PROVIDER_SITE_OTHER): Payer: Medicare Other | Admitting: Gynecology

## 2017-10-09 VITALS — BP 114/66

## 2017-10-09 DIAGNOSIS — N3000 Acute cystitis without hematuria: Secondary | ICD-10-CM | POA: Diagnosis not present

## 2017-10-09 MED ORDER — NITROFURANTOIN MONOHYD MACRO 100 MG PO CAPS: 100.0000 mg | ORAL_CAPSULE | Freq: Two times a day (BID) | ORAL | 0 refills | Status: DC

## 2017-10-09 NOTE — Patient Instructions (Signed)
Take the antibiotic twice daily for 7 days.  Follow-up if your symptoms persist, worsen or recur. 

## 2017-10-09 NOTE — Addendum Note (Signed)
Addended by: Nelva Nay on: 10/09/2017 02:39 PM   Modules accepted: Orders

## 2017-10-09 NOTE — Progress Notes (Signed)
    [name] August 03, 1945 [id number]        72 y.o.  G2P2002 presents with 1 day history of urinary frequency, urgency, mild dysuria with some stinging with urination.  No low back pain fever or chills.  No vaginal discharge, odor or itching.  She does have a remote history of HSV and was worried that this was an early outbreak causing the discomfort with urination.  She has no vulvar lesions or other symptoms.  Past medical history,surgical history, problem list, medications, allergies, family history and social history were all reviewed and documented in the EPIC chart.  Directed ROS with pertinent positives and negatives documented in the history of present illness/assessment and plan.  Exam: [name] assistant Vitals:   10/09/17 1401  BP: 114/66   General appearance:  Normal Abdomen soft nontender without masses guarding rebound Pelvic external BUS vagina with atrophic changes.  No ulcerations, lesions, discharge or other abnormalities.  Cervix with atrophic changes.  Uterus grossly normal midline mobile nontender.  Adnexa without masses or tenderness.  Assessment/Plan:  72 y.o. [id number] with history and exam as above.  No evidence of HSV.  Urine analysis consistent with UTI.  Allergic to sulfa.  History of tendinitis.  Will treat with Macrobid 100 mg twice daily x7 days.  Follow-up if symptoms persist, worsen or recur.    Anastasio Auerbach MD, 2:10 PM 10/09/2017

## 2017-10-10 ENCOUNTER — Ambulatory Visit: Payer: Medicare Other | Admitting: Gynecology

## 2017-10-11 LAB — URINALYSIS, COMPLETE W/RFL CULTURE
BILIRUBIN URINE: NEGATIVE
GLUCOSE, UA: NEGATIVE
Hyaline Cast: NONE SEEN /LPF
KETONES UR: NEGATIVE
Nitrites, Initial: NEGATIVE
PH: 5 (ref 5.0–8.0)
SPECIFIC GRAVITY, URINE: 1.026 (ref 1.001–1.03)

## 2017-10-11 LAB — URINE CULTURE
MICRO NUMBER: 91163857
SPECIMEN QUALITY: ADEQUATE

## 2017-10-11 LAB — CULTURE INDICATED

## 2017-10-13 DIAGNOSIS — M65332 Trigger finger, left middle finger: Secondary | ICD-10-CM | POA: Diagnosis not present

## 2017-10-14 ENCOUNTER — Other Ambulatory Visit: Payer: Self-pay | Admitting: Orthopedic Surgery

## 2017-10-31 ENCOUNTER — Other Ambulatory Visit: Payer: Self-pay | Admitting: Internal Medicine

## 2017-11-27 ENCOUNTER — Ambulatory Visit (INDEPENDENT_AMBULATORY_CARE_PROVIDER_SITE_OTHER): Payer: Medicare Other | Admitting: Internal Medicine

## 2017-11-27 DIAGNOSIS — Z23 Encounter for immunization: Secondary | ICD-10-CM | POA: Diagnosis not present

## 2017-11-27 NOTE — Patient Instructions (Signed)
Patient received a flu vaccine IM L deltoid, AV, CMA  

## 2017-12-05 ENCOUNTER — Other Ambulatory Visit: Payer: Self-pay | Admitting: Internal Medicine

## 2017-12-17 ENCOUNTER — Other Ambulatory Visit: Payer: Self-pay

## 2017-12-17 ENCOUNTER — Encounter (HOSPITAL_BASED_OUTPATIENT_CLINIC_OR_DEPARTMENT_OTHER): Payer: Self-pay

## 2017-12-23 ENCOUNTER — Other Ambulatory Visit: Payer: Self-pay | Admitting: Internal Medicine

## 2017-12-23 NOTE — Telephone Encounter (Signed)
We are NOT changing from Losartan 50 mg daily to Diovan. She should check with other pharmacies.

## 2017-12-25 ENCOUNTER — Encounter (HOSPITAL_BASED_OUTPATIENT_CLINIC_OR_DEPARTMENT_OTHER)
Admission: RE | Disposition: A | Discharge status: Home / Self Care | Payer: Self-pay | Source: Home / Self Care | Attending: Orthopedic Surgery

## 2017-12-25 ENCOUNTER — Telehealth: Payer: Self-pay | Admitting: Internal Medicine

## 2017-12-25 ENCOUNTER — Ambulatory Visit (HOSPITAL_BASED_OUTPATIENT_CLINIC_OR_DEPARTMENT_OTHER): Payer: Medicare Other | Admitting: Anesthesiology

## 2017-12-25 ENCOUNTER — Encounter (HOSPITAL_BASED_OUTPATIENT_CLINIC_OR_DEPARTMENT_OTHER): Payer: Self-pay | Admitting: *Deleted

## 2017-12-25 ENCOUNTER — Ambulatory Visit (HOSPITAL_BASED_OUTPATIENT_CLINIC_OR_DEPARTMENT_OTHER)
Admission: RE | Admit: 2017-12-25 | Discharge: 2017-12-25 | Disposition: A | Discharge status: Home / Self Care | Payer: Medicare Other | Attending: Orthopedic Surgery | Admitting: Orthopedic Surgery

## 2017-12-25 DIAGNOSIS — E785 Hyperlipidemia, unspecified: Secondary | ICD-10-CM | POA: Insufficient documentation

## 2017-12-25 DIAGNOSIS — E559 Vitamin D deficiency, unspecified: Secondary | ICD-10-CM | POA: Diagnosis not present

## 2017-12-25 DIAGNOSIS — Z833 Family history of diabetes mellitus: Secondary | ICD-10-CM | POA: Diagnosis not present

## 2017-12-25 DIAGNOSIS — Z885 Allergy status to narcotic agent status: Secondary | ICD-10-CM | POA: Diagnosis not present

## 2017-12-25 DIAGNOSIS — G4730 Sleep apnea, unspecified: Secondary | ICD-10-CM | POA: Insufficient documentation

## 2017-12-25 DIAGNOSIS — M65842 Other synovitis and tenosynovitis, left hand: Secondary | ICD-10-CM | POA: Diagnosis not present

## 2017-12-25 DIAGNOSIS — I341 Nonrheumatic mitral (valve) prolapse: Secondary | ICD-10-CM | POA: Insufficient documentation

## 2017-12-25 DIAGNOSIS — Z79899 Other long term (current) drug therapy: Secondary | ICD-10-CM | POA: Insufficient documentation

## 2017-12-25 DIAGNOSIS — Z96653 Presence of artificial knee joint, bilateral: Secondary | ICD-10-CM | POA: Diagnosis not present

## 2017-12-25 DIAGNOSIS — I10 Essential (primary) hypertension: Secondary | ICD-10-CM | POA: Insufficient documentation

## 2017-12-25 DIAGNOSIS — M65332 Trigger finger, left middle finger: Secondary | ICD-10-CM | POA: Insufficient documentation

## 2017-12-25 DIAGNOSIS — M1990 Unspecified osteoarthritis, unspecified site: Secondary | ICD-10-CM | POA: Diagnosis not present

## 2017-12-25 DIAGNOSIS — Z882 Allergy status to sulfonamides status: Secondary | ICD-10-CM | POA: Insufficient documentation

## 2017-12-25 DIAGNOSIS — M659 Synovitis and tenosynovitis, unspecified: Secondary | ICD-10-CM | POA: Diagnosis not present

## 2017-12-25 HISTORY — PX: TRIGGER FINGER RELEASE: SHX641

## 2017-12-25 SURGERY — RELEASE, A1 PULLEY, FOR TRIGGER FINGER
Anesthesia: Monitor Anesthesia Care | Site: Hand | Laterality: Left

## 2017-12-25 MED ORDER — MIDAZOLAM HCL 2 MG/2ML IJ SOLN: 1.0000 mg | INTRAMUSCULAR | Status: DC | PRN

## 2017-12-25 MED ORDER — CEFAZOLIN SODIUM-DEXTROSE 2-4 GM/100ML-% IV SOLN
INTRAVENOUS | Status: AC
  Filled 2017-12-25: qty 100

## 2017-12-25 MED ORDER — FENTANYL CITRATE (PF) 100 MCG/2ML IJ SOLN
INTRAMUSCULAR | Status: AC
  Filled 2017-12-25: qty 2

## 2017-12-25 MED ORDER — ONDANSETRON HCL 4 MG/2ML IJ SOLN
INTRAMUSCULAR | Status: DC | PRN
  Administered 2017-12-25: 4 mg via INTRAVENOUS

## 2017-12-25 MED ORDER — LIDOCAINE HCL (PF) 0.5 % IJ SOLN
INTRAMUSCULAR | Status: AC
  Filled 2017-12-25: qty 50

## 2017-12-25 MED ORDER — FENTANYL CITRATE (PF) 100 MCG/2ML IJ SOLN
50.0000 ug | INTRAMUSCULAR | Status: DC | PRN
  Administered 2017-12-25: 50 ug via INTRAVENOUS

## 2017-12-25 MED ORDER — CEFAZOLIN SODIUM-DEXTROSE 2-4 GM/100ML-% IV SOLN
2.0000 g | INTRAVENOUS | Status: AC
  Administered 2017-12-25: 2 g via INTRAVENOUS

## 2017-12-25 MED ORDER — ONDANSETRON HCL 4 MG/2ML IJ SOLN
INTRAMUSCULAR | Status: AC
  Filled 2017-12-25: qty 2

## 2017-12-25 MED ORDER — CHLORHEXIDINE GLUCONATE 4 % EX LIQD: 60.0000 mL | Freq: Once | CUTANEOUS | Status: DC

## 2017-12-25 MED ORDER — TRAMADOL HCL 50 MG PO TABS: 50.0000 mg | ORAL_TABLET | Freq: Four times a day (QID) | ORAL | 0 refills | Status: DC | PRN

## 2017-12-25 MED ORDER — PROPOFOL 10 MG/ML IV BOLUS
INTRAVENOUS | Status: DC | PRN
  Administered 2017-12-25 (×4): 10 mg via INTRAVENOUS

## 2017-12-25 MED ORDER — LIDOCAINE HCL (PF) 0.5 % IJ SOLN
INTRAMUSCULAR | Status: DC | PRN
  Administered 2017-12-25: 30 mL via INTRAVENOUS

## 2017-12-25 MED ORDER — BUPIVACAINE HCL (PF) 0.25 % IJ SOLN
INTRAMUSCULAR | Status: DC | PRN
  Administered 2017-12-25: 5 mL

## 2017-12-25 MED ORDER — SCOPOLAMINE 1 MG/3DAYS TD PT72: 1.0000 | MEDICATED_PATCH | Freq: Once | TRANSDERMAL | Status: DC | PRN

## 2017-12-25 MED ORDER — LACTATED RINGERS IV SOLN
INTRAVENOUS | Status: DC
  Administered 2017-12-25: 11:00:00 via INTRAVENOUS

## 2017-12-25 SURGICAL SUPPLY — 35 items
BANDAGE COBAN STERILE 2 (GAUZE/BANDAGES/DRESSINGS) ×3 IMPLANT
BLADE SURG 15 STRL LF DISP TIS (BLADE) ×1 IMPLANT
BLADE SURG 15 STRL SS (BLADE) ×3
BNDG CMPR 9X4 STRL LF SNTH (GAUZE/BANDAGES/DRESSINGS)
BNDG ESMARK 4X9 LF (GAUZE/BANDAGES/DRESSINGS) IMPLANT
CHLORAPREP W/TINT 26ML (MISCELLANEOUS) ×3 IMPLANT
CORD BIPOLAR FORCEPS 12FT (ELECTRODE) IMPLANT
COVER BACK TABLE 60X90IN (DRAPES) ×3 IMPLANT
COVER MAYO STAND STRL (DRAPES) ×3 IMPLANT
COVER WAND RF STERILE (DRAPES) IMPLANT
CUFF TOURNIQUET SINGLE 18IN (TOURNIQUET CUFF) IMPLANT
DECANTER SPIKE VIAL GLASS SM (MISCELLANEOUS) IMPLANT
DRAPE EXTREMITY T 121X128X90 (DRAPE) ×3 IMPLANT
DRAPE SURG 17X23 STRL (DRAPES) ×3 IMPLANT
GAUZE SPONGE 4X4 12PLY STRL (GAUZE/BANDAGES/DRESSINGS) ×3 IMPLANT
GAUZE XEROFORM 1X8 LF (GAUZE/BANDAGES/DRESSINGS) ×3 IMPLANT
GLOVE BIO SURGEON STRL SZ7 (GLOVE) ×2 IMPLANT
GLOVE BIOGEL PI IND STRL 7.0 (GLOVE) IMPLANT
GLOVE BIOGEL PI IND STRL 8.5 (GLOVE) ×1 IMPLANT
GLOVE BIOGEL PI INDICATOR 7.0 (GLOVE) ×4
GLOVE BIOGEL PI INDICATOR 8.5 (GLOVE) ×2
GLOVE SURG ORTHO 8.0 STRL STRW (GLOVE) ×3 IMPLANT
GOWN STRL REUS W/ TWL LRG LVL3 (GOWN DISPOSABLE) ×1 IMPLANT
GOWN STRL REUS W/TWL LRG LVL3 (GOWN DISPOSABLE) ×3
GOWN STRL REUS W/TWL XL LVL3 (GOWN DISPOSABLE) ×3 IMPLANT
NDL PRECISIONGLIDE 27X1.5 (NEEDLE) ×1 IMPLANT
NEEDLE PRECISIONGLIDE 27X1.5 (NEEDLE) ×3 IMPLANT
NS IRRIG 1000ML POUR BTL (IV SOLUTION) ×3 IMPLANT
PACK BASIN DAY SURGERY FS (CUSTOM PROCEDURE TRAY) ×3 IMPLANT
STOCKINETTE 4X48 STRL (DRAPES) ×3 IMPLANT
SUT ETHILON 4 0 PS 2 18 (SUTURE) ×3 IMPLANT
SYR BULB 3OZ (MISCELLANEOUS) ×3 IMPLANT
SYR CONTROL 10ML LL (SYRINGE) ×3 IMPLANT
TOWEL GREEN STERILE FF (TOWEL DISPOSABLE) ×6 IMPLANT
UNDERPAD 30X30 (UNDERPADS AND DIAPERS) ×3 IMPLANT

## 2017-12-25 NOTE — H&P (Signed)
[name] is an 72 y.o. female.   Chief Complaint: catching left middle finger [id number] is a 72 year old female with stenosing tenosynovitis left middle finger and CMC arthritis left thumb. She was last seen on 09/10/2017. She had an injection of the A1 pulley for second time at that time. She states she had only temporary relief with it for a day or 2. She states that it is catching again is painful for her. She states that the thumb has significantly settled down with anti-inflammatories and splinting. Localized pain in the left middle finger at the A1 pulley part of her palm. Is not complaining of any numbness or tingling. She has a history of arthritis no history of diabetes thyroid problems or gout. Family history is positive diabetes negative for thyroid problems arthritis and gout.   Past Medical History:  Diagnosis Date  . Arthritis   . Genital HSV 03/2013   Positive HSV 1 genital PCR  . Heart murmur   . Hyperlipidemia   . Hypertension   . Mitral valve prolapse    since age 71  . Overactive bladder   . Ringing in ears 2010   interfers with clarity of hearing at times  . Sleep apnea    pt does not use CPAP  . Stress incontinence in female    wears a pad    Past Surgical History:  Procedure Laterality Date  . BACK SURGERY    . BREAST BIOPSY Left 08/07/2015   benign  . BREAST EXCISIONAL BIOPSY Bilateral 01/27/1996   benign  . BREAST SURGERY     BENIGN RIGHT BR MASS,Bx's X 2  . FOOT SURGERY  2013   CORRECTION OF "HAMMER TOES" -LEFT FOOT  . HAND SURGERY     RIGHT THUMB BONE SPUR  . JOINT REPLACEMENT    . KNEE ARTHROSCOPY  2006, 2010, 2012   LEFT KNEE IN  2010/ RIGHT KNEE IN 2006 AND 2012  . KNEE ARTHROSCOPY  01/24/2012   Procedure: ARTHROSCOPY KNEE;  Surgeon: Johnn Hai, MD;  Location: [facility];  Service: Orthopedics;  Laterality: Left;  left knee arthroscopy with debridment, partial lateral menisectomy  . OOPHORECTOMY     RIGHT OVARY  REMOVED  . SPINE SURGERY    . TONSILLECTOMY    . TOTAL KNEE ARTHROPLASTY  2012   Right  . TOTAL KNEE ARTHROPLASTY Left 01/25/2016   Procedure: LEFT TOTAL KNEE ARTHROPLASTY, EVALUATION UNDER ANESTHESIA AND MANIPULATION UNDER ANESTHESIA;  Surgeon: Susa Day, MD;  Location: [facility];  Service: Orthopedics;  Laterality: Left;  . TUBAL LIGATION      Family History  Problem Relation Age of Onset  . Hypertension Mother   . Heart disease Mother   . Kidney failure Mother   . Other Mother        sclaraderma  . Diabetes Maternal Grandmother        TYPE 2  . Hypertension Maternal Grandmother   . Other Maternal Grandmother        abdominal anersym  . Heart attack Father    Social History:  reports that she has never smoked. She has never used smokeless tobacco. She reports current alcohol use. She reports that she does not use drugs.  Allergies:  Allergies  Allergen Reactions  . Sulfa Antibiotics Hives  . Codeine Itching  . Morphine Itching    No medications prior to admission.    No results found for this or any previous visit (from the past 48 hour(s)).  No results found.   Pertinent items are noted in HPI.  Height 5\' 6"  (1.676 m), weight 81.6 kg.  General appearance: alert, cooperative and appears stated age Head: Normocephalic, without obvious abnormality Neck: no JVD Resp: clear to auscultation bilaterally Cardio: regular rate and rhythm, S1, S2 normal, no murmur, click, rub or gallop GI: soft, non-tender; bowel sounds normal; no masses,  no organomegaly Extremities: catching left middle finger Pulses: 2+ and symmetric Skin: Skin color, texture, turgor normal. No rashes or lesions Neurologic: Grossly normal Incision/Wound: na  Assessment/Plan  Assessment:  1. Trigger middle finger of left hand    Plan: She would like to have her left middle finger surgically taken care of. She does not want to have anything done to her thumb at the present time. Is scheduled  for release A1 pulley left middle finger as an outpatient under regional anesthesia. Pre-peri-and postoperative course been discussed along with risks and complications. She is aware that there is no guarantee to the surgery the possibility of infection recurrence injury to arteries nerves tendons complete relief symptoms dystrophy. Since A1 pulley as an outpatient under regional anesthesia.   Daryll Brod 12/25/2017, 8:59 AM

## 2017-12-25 NOTE — Discharge Instructions (Addendum)

## 2017-12-25 NOTE — Progress Notes (Signed)
Patient's HR 43. Notified Dr. Ola Spurr who states it is okay to proceed with surgery.

## 2017-12-25 NOTE — Telephone Encounter (Signed)
See next week. Stop metoprolol

## 2017-12-25 NOTE — Anesthesia Postprocedure Evaluation (Signed)
Anesthesia Post Note  Patient: [name]  Procedure(s) Performed: RELEASE TRIGGER FINGER/A-1 PULLEY LEFT MIDDLE FINGER (Left Hand)     Patient location during evaluation: PACU Anesthesia Type: MAC and Bier Block Level of consciousness: awake and alert Pain management: pain level controlled Vital Signs Assessment: post-procedure vital signs reviewed and stable Respiratory status: spontaneous breathing, nonlabored ventilation and respiratory function stable Cardiovascular status: stable and blood pressure returned to baseline Postop Assessment: no apparent nausea or vomiting Anesthetic complications: no    Last Vitals:  Vitals:   12/25/17 1304 12/25/17 1318  BP: (!) 146/63 (!) 170/70  Pulse: (!) 40 (!) 41  Resp: 13 16  Temp:  36.7 C  SpO2: 98% 100%    Last Pain:  Vitals:   12/25/17 1318  PainSc: 0-No pain                 Charlesia Canaday,W. EDMOND

## 2017-12-25 NOTE — Telephone Encounter (Signed)
Pt called about having surgery on her middle finger at [facility] on [address]. When was checked in her Heart rate was 43 during surgery heart rate dropped to 38. Pt is only taking 25 mg of atenolol (TENORMIN) 50 MG tablet. Pt wants to know are any concerns and if so what should she do? Pt feels ok and BP was in the 140s because she has not started back on Losartan she has to start back calling the pharmacies. Please advise? Thank you!  Call pt @ [phone].

## 2017-12-25 NOTE — Anesthesia Preprocedure Evaluation (Addendum)
Anesthesia Evaluation  Patient identified by MRN, date of birth, ID band Patient awake    Reviewed: Allergy & Precautions, H&P , NPO status , Patient's Chart, lab work & pertinent test results, reviewed documented beta blocker date and time   Airway Mallampati: II  TM Distance: >3 FB Neck ROM: Full    Dental no notable dental hx. (+) Teeth Intact, Dental Advisory Given   Pulmonary sleep apnea ,    Pulmonary exam normal breath sounds clear to auscultation       Cardiovascular hypertension, Pt. on medications and Pt. on home beta blockers + Valvular Problems/Murmurs MVP  Rhythm:Regular Rate:Normal     Neuro/Psych negative neurological ROS  negative psych ROS   GI/Hepatic negative GI ROS, Neg liver ROS,   Endo/Other  negative endocrine ROS  Renal/GU negative Renal ROS  negative genitourinary   Musculoskeletal  (+) Arthritis , Osteoarthritis,    Abdominal   Peds  Hematology negative hematology ROS (+)   Anesthesia Other Findings   Reproductive/Obstetrics negative OB ROS                            Anesthesia Physical Anesthesia Plan  ASA: II  Anesthesia Plan: MAC and Bier Block and Bier Block-LIDOCAINE ONLY   Post-op Pain Management:    Induction: Intravenous  PONV Risk Score and Plan: 3 and Ondansetron, Dexamethasone, Propofol infusion and Midazolam  Airway Management Planned: Simple Face Mask  Additional Equipment:   Intra-op Plan:   Post-operative Plan:   Informed Consent: I have reviewed the patients History and Physical, chart, labs and discussed the procedure including the risks, benefits and alternatives for the proposed anesthesia with the patient or authorized representative who has indicated his/her understanding and acceptance.   Dental advisory given  Plan Discussed with: CRNA  Anesthesia Plan Comments:        Anesthesia Quick Evaluation

## 2017-12-25 NOTE — Brief Op Note (Signed)
12/25/2017  12:48 PM  PATIENT:  [name]  72 y.o. female  PRE-OPERATIVE DIAGNOSIS:  STENOSING TENOSYNOVITIS LEFT MIDDLE FINGER  POST-OPERATIVE DIAGNOSIS:  STENOSING TENOSYNOVITIS LEFT MIDDLE FINGER  PROCEDURE:  Procedure(s): RELEASE TRIGGER FINGER/A-1 PULLEY LEFT MIDDLE FINGER (Left)  SURGEON:  Surgeon(s) and Role:    Daryll Brod, MD - Primary  PHYSICIAN ASSISTANT:   ASSISTANTS: none   ANESTHESIA:   local, regional and IV sedation  EBL:  2 mL   BLOOD ADMINISTERED:none  DRAINS: none   LOCAL MEDICATIONS USED:  BUPIVICAINE   SPECIMEN:  No Specimen  DISPOSITION OF SPECIMEN:  N/A  COUNTS:  YES  TOURNIQUET:   Total Tourniquet Time Documented: Forearm (Left) - 18 minutes Total: Forearm (Left) - 18 minutes   DICTATION: .Viviann Spare Dictation  PLAN OF CARE: Discharge to home after PACU  PATIENT DISPOSITION:  PACU - hemodynamically stable.

## 2017-12-25 NOTE — Op Note (Signed)
NAME: [name] MEDICAL RECORD NO: [id number] DATE OF BIRTH: Jun 22, 1945 FACILITY: [facility] LOCATION: [facility] PHYSICIAN: Wynonia Sours, MD   OPERATIVE REPORT   DATE OF PROCEDURE: 12/25/17    PREOPERATIVE DIAGNOSIS:   Stenosing tenosynovitis left middle finger   POSTOPERATIVE DIAGNOSIS:   Same   PROCEDURE:   Release A1 pulley left middle finger   SURGEON: Daryll Brod, M.D.   ASSISTANT: none   ANESTHESIA:  Bier block with sedation and Local   INTRAVENOUS FLUIDS:  Per anesthesia flow sheet.   ESTIMATED BLOOD LOSS:  Minimal.   COMPLICATIONS:  None.   SPECIMENS:  none   TOURNIQUET TIME:    Total Tourniquet Time Documented: Forearm (Left) - 18 minutes Total: Forearm (Left) - 18 minutes    DISPOSITION:  Stable to PACU.   INDICATIONS: Patient is a 72 year old female with a history of trigger of her left middle finger this has resulted in a flexion deformity to the PIP joint with continued catching.  This not responded to conservative treatment she is elected to undergo surgical release of the A1 pulley.  Pre-peri-and postoperative course been discussed along with risks and complications.  She is aware that there is no guarantee to the surgery the possibility of infection recurrence injury to arteries nerves tendons incomplete relief of symptoms distally.  The preoperative area the patient seen extremity marked by both patient and surgeon antibiotic given  OPERATIVE COURSE: Patient is brought to the operating room where a forearm-based IV regional anesthetic was carried out without difficulty.  She was prepped using ChloraPrep in a supine position left arm free.  A three-minute three-minute dry time was allowed timeout taken to confirm patient procedure.  An oblique incision was made over the A1 pulley left middle finger carried down through subcutaneous tissue.  Bleeders were electrocauterized with bipolar.  Retractors were placed retracting vascular bundles  radially and ulnarly.  The A1 pulley was found to be markedly thickened.  This was released on its radial border small incision made in A2.  I have moderate abrasions to the flexor superficialis was noted.  The 2 tendons were then separated with blunt dissection.  Placed through full passive range of motion no further trigger was noted.  The wound was copiously irrigated with saline.  The skin was closed interrupted 4-0 nylon sutures.  Local infiltration quarter percent bupivacaine without epinephrine was good approximately 6 cc was used.  Sterile compressive dressing with the fingers free was applied.  Deflation of the tourniquet all fingers immediately pink.  She was taken to the recovery room for observation in satisfactory condition.  She will be discharged home to return hand center [facility] in 1 week on Tylenol ibuprofen with tramadol for breakthrough.   Daryll Brod, MD Electronically signed, 12/25/17

## 2017-12-25 NOTE — Transfer of Care (Signed)
Immediate Anesthesia Transfer of Care Note  Patient: [name]  Procedure(s) Performed: RELEASE TRIGGER FINGER/A-1 PULLEY LEFT MIDDLE FINGER (Left Hand)  Patient Location: PACU  Anesthesia Type:Bier block  Level of Consciousness: awake, alert , oriented and patient cooperative  Airway & Oxygen Therapy: Patient Spontanous Breathing  Post-op Assessment: Report given to RN and Post -op Vital signs reviewed and stable  Post vital signs: Reviewed and stable  Last Vitals:  Vitals Value Taken Time  BP 146/76 12/25/2017 12:50 PM  Temp    Pulse 43 12/25/2017 12:51 PM  Resp 17 12/25/2017 12:51 PM  SpO2 97 % 12/25/2017 12:51 PM  Vitals shown include unvalidated device data.  Last Pain:  Vitals:   12/25/17 1147  PainSc: 0-No pain         Complications: No apparent anesthesia complications

## 2017-12-25 NOTE — Telephone Encounter (Addendum)
Done

## 2017-12-26 ENCOUNTER — Encounter (HOSPITAL_BASED_OUTPATIENT_CLINIC_OR_DEPARTMENT_OTHER): Payer: Self-pay | Admitting: Orthopedic Surgery

## 2017-12-29 ENCOUNTER — Ambulatory Visit (INDEPENDENT_AMBULATORY_CARE_PROVIDER_SITE_OTHER): Payer: Medicare Other | Admitting: Internal Medicine

## 2017-12-29 VITALS — Temp 98.1°F | Resp 16 | Wt 185.0 lb

## 2017-12-29 DIAGNOSIS — R001 Bradycardia, unspecified: Secondary | ICD-10-CM

## 2017-12-29 NOTE — Progress Notes (Signed)
   Subjective:    Patient ID: [name], female    DOB: 11-20-1945, 72 y.o.   MRN: [id number]  HPI 72 year old Female for  follow up of bradycardia.  Recently had stenosing tenosynovitis left middle finger and underwent release A1 pulley left middle finger by Dr. Fredna Dow.  Apparently developed bradycardia.  Subsequently stopped taking Tenormin and has had no further episodes of bradycardia.  Remains on Cozaar.  I believe Tenormin had been previously prescribed a number of years ago for mitral valve prolapse but also treated blood pressure.  Apparently heart rate was 43 prior to surgery and dropped to 38 during surgery.  Says she was only taking 25 mg of Tenormin.  Blood pressure was stable in the 140s.    Review of Systems Recovering from surgery no complaints.  No chest pain or shortness of breath    Objective:   Physical Exam Neck is supple without JVD thyromegaly or carotid bruits.  Chest clear to auscultation.  Pulse is 61 today.  Blood pressure stable today.  Cardiac exam regular rate and rhythm.  Extremities without edema       Assessment & Plan:  Bradycardia in perioperative period  Plan: Discontinue Tenormin and continue losartan.  Follow-up PRN.

## 2017-12-29 NOTE — Patient Instructions (Addendum)
Stay off of Tenormin. Monitor BP and pulse at home.

## 2017-12-31 ENCOUNTER — Other Ambulatory Visit: Payer: Self-pay | Admitting: Internal Medicine

## 2018-01-13 ENCOUNTER — Encounter: Payer: Self-pay | Admitting: Internal Medicine

## 2018-01-21 ENCOUNTER — Ambulatory Visit (INDEPENDENT_AMBULATORY_CARE_PROVIDER_SITE_OTHER): Payer: Medicare Other | Admitting: Podiatry

## 2018-01-21 VITALS — BP 144/82 | HR 86

## 2018-01-21 DIAGNOSIS — L600 Ingrowing nail: Secondary | ICD-10-CM

## 2018-01-21 MED ORDER — NEOMYCIN-POLYMYXIN-HC 3.5-10000-1 OT SOLN: OTIC | 1 refills | Status: DC

## 2018-01-21 NOTE — Patient Instructions (Signed)

## 2018-01-21 NOTE — Progress Notes (Signed)
Subjective:   Patient ID: [name], female   DOB: 73 y.o.   MRN: [id number]   HPI Patient presents with painful ingrown toenail of the right big toe medial border that she states she is tried to trim and soak without relief and she would like it removed.  States is been there fairly long time patient does not smoke and likes to be active   Review of Systems  All other systems reviewed and are negative.       Objective:  Physical Exam Vitals signs and nursing note reviewed.  Constitutional:      Appearance: She is well-developed.  Pulmonary:     Effort: Pulmonary effort is normal.  Musculoskeletal: Normal range of motion.  Skin:    General: Skin is warm.  Neurological:     Mental Status: She is alert.     Neurovascular status intact muscle strength is adequate range of motion within normal limits with patient found to have incurvated right hallux medial border that is very tender when pressed and is making shoe gear difficult.  Patient is noted to have good digital perfusion and is well oriented x3     Assessment:  Chronic ingrown toenail the right big toe medial border with pain with deformity of the nailbed and no redness drainage to indicate any kind of infective process     Plan:  H&P condition reviewed and recommended removal of the border explaining procedure risk and allowed patient to sign consent form.  Today I infiltrated the right hallux 60 mg like Marcaine mixture remove the medial border exposed matrix and applied phenol 3 applications 30 seconds followed by alcohol lavage and sterile dressing.  Gave instructions on soaks reappoint and did write prescription for Corticosporin otic solution and explained to take the dressing off early if it should start to throb and if not leave on 24 hours.  Encouraged to call with any questions concerns she may have

## 2018-03-26 ENCOUNTER — Other Ambulatory Visit: Payer: Medicare Other | Admitting: Internal Medicine

## 2018-03-26 ENCOUNTER — Other Ambulatory Visit: Payer: Self-pay

## 2018-03-26 DIAGNOSIS — E7800 Pure hypercholesterolemia, unspecified: Secondary | ICD-10-CM | POA: Diagnosis not present

## 2018-03-26 DIAGNOSIS — I10 Essential (primary) hypertension: Secondary | ICD-10-CM | POA: Diagnosis not present

## 2018-03-26 DIAGNOSIS — R7302 Impaired glucose tolerance (oral): Secondary | ICD-10-CM | POA: Diagnosis not present

## 2018-03-27 ENCOUNTER — Encounter: Payer: Self-pay | Admitting: Internal Medicine

## 2018-03-27 ENCOUNTER — Ambulatory Visit (INDEPENDENT_AMBULATORY_CARE_PROVIDER_SITE_OTHER): Payer: Medicare Other | Admitting: Internal Medicine

## 2018-03-27 VITALS — BP 130/80 | HR 82 | Ht 65.0 in

## 2018-03-27 DIAGNOSIS — G4733 Obstructive sleep apnea (adult) (pediatric): Secondary | ICD-10-CM

## 2018-03-27 DIAGNOSIS — I10 Essential (primary) hypertension: Secondary | ICD-10-CM

## 2018-03-27 DIAGNOSIS — R7302 Impaired glucose tolerance (oral): Secondary | ICD-10-CM

## 2018-03-27 DIAGNOSIS — E7800 Pure hypercholesterolemia, unspecified: Secondary | ICD-10-CM | POA: Diagnosis not present

## 2018-03-27 LAB — HEPATIC FUNCTION PANEL
AG Ratio: 1.9 (calc) (ref 1.0–2.5)
ALKALINE PHOSPHATASE (APISO): 53 U/L (ref 37–153)
ALT: 16 U/L (ref 6–29)
AST: 17 U/L (ref 10–35)
Albumin: 4.4 g/dL (ref 3.6–5.1)
BILIRUBIN INDIRECT: 0.6 mg/dL (ref 0.2–1.2)
Bilirubin, Direct: 0.1 mg/dL (ref 0.0–0.2)
Globulin: 2.3 g/dL (calc) (ref 1.9–3.7)
Total Bilirubin: 0.7 mg/dL (ref 0.2–1.2)
Total Protein: 6.7 g/dL (ref 6.1–8.1)

## 2018-03-27 LAB — LIPID PANEL
Cholesterol: 199 mg/dL (ref <200)
HDL: 61 mg/dL (ref >=50)
LDL Cholesterol (Calc): 122 mg/dL (calc) — ABNORMAL HIGH
Non-HDL Cholesterol (Calc): 138 mg/dL (calc) — ABNORMAL HIGH (ref <130)
Total CHOL/HDL Ratio: 3.3 (calc) (ref <5.0)
Triglycerides: 65 mg/dL (ref <150)

## 2018-03-27 LAB — HEMOGLOBIN A1C
Hgb A1c MFr Bld: 5.3 % of total Hgb (ref <5.7)
Mean Plasma Glucose: 105 (calc)
eAG (mmol/L): 5.8 (calc)

## 2018-03-27 NOTE — Progress Notes (Signed)
   Subjective:    Patient ID: [name], female    DOB: 1945-12-16, 73 y.o.   MRN: [id number]  HPI 73 year old Female in today for 36-month recheck.  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  She would like to be fully retired.  She is considering downsizing to a smaller home.  In December had trigger finger release left middle finger by Dr. Fredna Dow.  She had physical therapy at his office.  Continues to be followed by Dr. Fredna Dow.  She has a splint.  Is also wearing splints for CMC arthritis.  History of impaired glucose tolerance but hemoglobin A1c is normal at 5.3%.  Lipid panel shows LDL at 122 but normal total cholesterol and triglycerides.  Previously in September 2019 lipid panel was normal.  Her blood pressure is stable at 130/80.  BMI is 30.79.  She exercises by doing yard work    Review of Systems see above     Objective:   Physical Exam Vital signs reviewed.  Skin warm and dry.  Nodes none.  Neck is supple.  No JVD thyromegaly or carotid bruits.  Chest clear to auscultation.  Cardiac exam regular rate and rhythm normal S1 and S2.  Extremities without edema.       Assessment & Plan:  Essential hypertension-stable on current regimen.  No change in medications.  Hyperlipidemia-she is on Zocor 20 mg daily but LDL is elevated.  Previous lipid panels have been normal.  Continue to monitor.  Repeat in 6 months.  History of impaired glucose tolerance-hemoglobin A1c is normal with diet alone  History of sleep apnea  Plan: Continue current medications.  Work on diet and exercise and follow-up in 6 months at time of physical examination.  No change in medications at the present time.

## 2018-03-31 DIAGNOSIS — H2513 Age-related nuclear cataract, bilateral: Secondary | ICD-10-CM | POA: Diagnosis not present

## 2018-03-31 DIAGNOSIS — H52203 Unspecified astigmatism, bilateral: Secondary | ICD-10-CM | POA: Diagnosis not present

## 2018-04-12 ENCOUNTER — Encounter: Payer: Self-pay | Admitting: Internal Medicine

## 2018-04-12 DIAGNOSIS — I10 Essential (primary) hypertension: Secondary | ICD-10-CM | POA: Insufficient documentation

## 2018-04-12 NOTE — Patient Instructions (Signed)
Continue to work on diet exercise and weight loss.  LDL cholesterol is elevated.  Reevaluate in 6 months at time of physical exam.  Blood pressure is stable.  Hemoglobin A1c is normal.  It was a pleasure to see you today.

## 2018-04-15 DIAGNOSIS — M2450 Contracture, unspecified joint: Secondary | ICD-10-CM | POA: Diagnosis not present

## 2018-04-15 DIAGNOSIS — M65332 Trigger finger, left middle finger: Secondary | ICD-10-CM | POA: Diagnosis not present

## 2018-05-15 ENCOUNTER — Telehealth: Payer: Self-pay | Admitting: Internal Medicine

## 2018-05-15 NOTE — Telephone Encounter (Signed)
LVM to CB and schedule CPE and Labs after 09/30/18

## 2018-05-18 NOTE — Telephone Encounter (Signed)
Pt called back and scheduled CPE and Labs

## 2018-06-22 ENCOUNTER — Other Ambulatory Visit: Payer: Self-pay

## 2018-06-22 MED ORDER — LOSARTAN POTASSIUM 50 MG PO TABS: 50.0000 mg | ORAL_TABLET | Freq: Every day | ORAL | 0 refills | Status: DC

## 2018-07-03 ENCOUNTER — Other Ambulatory Visit: Payer: Self-pay | Admitting: Internal Medicine

## 2018-07-03 ENCOUNTER — Other Ambulatory Visit: Payer: Self-pay | Admitting: Gynecology

## 2018-07-03 DIAGNOSIS — Z1231 Encounter for screening mammogram for malignant neoplasm of breast: Secondary | ICD-10-CM

## 2018-08-19 ENCOUNTER — Other Ambulatory Visit: Payer: Self-pay

## 2018-08-20 ENCOUNTER — Ambulatory Visit (INDEPENDENT_AMBULATORY_CARE_PROVIDER_SITE_OTHER): Payer: Medicare Other | Admitting: Gynecology

## 2018-08-20 ENCOUNTER — Ambulatory Visit
Admission: RE | Admit: 2018-08-20 | Discharge: 2018-08-20 | Disposition: A | Discharge status: Home / Self Care | Payer: Medicare Other | Source: Ambulatory Visit | Attending: Gynecology | Admitting: Gynecology

## 2018-08-20 ENCOUNTER — Encounter: Payer: Self-pay | Admitting: Gynecology

## 2018-08-20 VITALS — BP 126/84 | Ht 64.0 in | Wt 185.0 lb

## 2018-08-20 DIAGNOSIS — Z01419 Encounter for gynecological examination (general) (routine) without abnormal findings: Secondary | ICD-10-CM

## 2018-08-20 DIAGNOSIS — Z1231 Encounter for screening mammogram for malignant neoplasm of breast: Secondary | ICD-10-CM

## 2018-08-20 DIAGNOSIS — Z9289 Personal history of other medical treatment: Secondary | ICD-10-CM

## 2018-08-20 DIAGNOSIS — N952 Postmenopausal atrophic vaginitis: Secondary | ICD-10-CM

## 2018-08-20 NOTE — Patient Instructions (Addendum)
Follow-up for your bone density as scheduled.  Follow-up in 1 year, sooner as needed.

## 2018-08-20 NOTE — Progress Notes (Signed)
    [name] 04-12-1945 [id number]        73 y.o.  [id number] for breast and pelvic exam.  Without gynecologic complaints.  Past medical history,surgical history, problem list, medications, allergies, family history and social history were all reviewed and documented as reviewed in the EPIC chart.  ROS:  Performed with pertinent positives and negatives included in the history, assessment and plan.   Additional significant findings : None   Exam: Copywriter, advertising Vitals:   08/20/18 1503  BP: 126/84  Weight: 185 lb (83.9 kg)  Height: 5\' 4"  (1.626 m)   Body mass index is 31.76 kg/m.  General appearance:  Normal affect, orientation and appearance. Skin: Grossly normal HEENT: Without gross lesions.  No cervical or supraclavicular adenopathy. Thyroid normal.  Lungs:  Clear without wheezing, rales or rhonchi Cardiac: RR, without RMG Abdominal:  Soft, nontender, without masses, guarding, rebound, organomegaly or hernia Breasts:  Examined lying and sitting without masses, retractions, discharge or axillary adenopathy. Pelvic:  Ext, BUS, Vagina: With atrophic changes  Cervix: With atrophic changes  Uterus: Anteverted, normal size, shape and contour, midline and mobile nontender   Adnexa: Without masses or tenderness    Anus and perineum: Normal   Rectovaginal: Normal sphincter tone without palpated masses or tenderness.    Assessment/Plan:  73 y.o. [id number] female for breast and pelvic exam  1. Postmenopausal.  No significant menopausal symptoms.  No vaginal bleeding. 2. Pap smear 2018.  No Pap smear done today.  No history of abnormal Pap smears.  Discussed current screening recommendations and options to stop screening reviewed.  Will readdress on an annual basis. 3. DEXA 2015 normal.  Recommend follow-up DEXA now at 5-year interval and she will schedule in follow-up for this. 4. Mammography today was normal.  Breast exam today is normal. 5. Colonoscopy 2017.  Repeat at  their recommended interval. 6. Health maintenance.  No routine lab work done as patient does this elsewhere.  Follow-up 1 year, sooner as needed.   Anastasio Auerbach MD, 3:25 PM 08/20/2018

## 2018-08-31 ENCOUNTER — Telehealth: Payer: Self-pay | Admitting: Internal Medicine

## 2018-08-31 NOTE — Telephone Encounter (Signed)
See tomorrow

## 2018-08-31 NOTE — Telephone Encounter (Signed)
Scheduled

## 2018-08-31 NOTE — Telephone Encounter (Signed)
Kalyani Maeda [phone]  [name] called to say she is having back pain that is running down her right leg. Intermittently, No fever, No COVID exposure or symptoms.

## 2018-09-01 ENCOUNTER — Other Ambulatory Visit: Payer: Self-pay

## 2018-09-01 ENCOUNTER — Ambulatory Visit
Admission: RE | Admit: 2018-09-01 | Discharge: 2018-09-01 | Disposition: A | Discharge status: Home / Self Care | Payer: Medicare Other | Source: Ambulatory Visit | Attending: Internal Medicine | Admitting: Internal Medicine

## 2018-09-01 ENCOUNTER — Ambulatory Visit (INDEPENDENT_AMBULATORY_CARE_PROVIDER_SITE_OTHER): Payer: Medicare Other | Admitting: Internal Medicine

## 2018-09-01 ENCOUNTER — Encounter: Payer: Self-pay | Admitting: Internal Medicine

## 2018-09-01 VITALS — Ht 64.0 in | Wt 184.0 lb

## 2018-09-01 DIAGNOSIS — M25551 Pain in right hip: Secondary | ICD-10-CM

## 2018-09-01 DIAGNOSIS — R5383 Other fatigue: Secondary | ICD-10-CM

## 2018-09-01 DIAGNOSIS — E7800 Pure hypercholesterolemia, unspecified: Secondary | ICD-10-CM | POA: Diagnosis not present

## 2018-09-01 LAB — POCT URINALYSIS DIPSTICK
Appearance: NEGATIVE
Bilirubin, UA: NEGATIVE
Blood, UA: NEGATIVE
Glucose, UA: NEGATIVE
Ketones, UA: NEGATIVE
Leukocytes, UA: NEGATIVE
Nitrite, UA: NEGATIVE
Odor: NEGATIVE
Protein, UA: NEGATIVE
Spec Grav, UA: 1.01 (ref 1.010–1.025)
Urobilinogen, UA: 0.2 E.U./dL
pH, UA: 6.5 (ref 5.0–8.0)

## 2018-09-01 NOTE — Progress Notes (Signed)
   Subjective:    Patient ID: [name], female    DOB: 1945-09-23, 73 y.o.   MRN: [id number]  HPI 73 year old Female in with several complaints. History of sleep apnea, hyperlipidemia and impaired glucose tolerance.History of GERD and HTN.  Had right knee TKA Feb.2012. Left knee arthroscopic surgery 2014. Left knee arthroscopic surgery 2010 and another left knee arthroscopic surgery October 2011. Surgery for torn meniscus right knee 2006.  Had L4-L5 microdisectomy October 2004.  Hx Mitral valve prolapse and trace MR and has taken beta blocker for years this palpitations and HTN. Uses CPAP.  History of back pain seen in the past treated by Dr. Erling Cruz with Cymbalta 90 mg daily and we have kept her on this for years.  Has developed right hip pain. Will order plain film to look for osteoarthritis. Has good ROM. Does yard work.  Has low back pain with radiation to right leg. Wants to see Neurologist.    Review of Systems rash in left axilla. Complaint of fatigue- labs will be drawn     Objective:   Physical Exam Skin warm and dry. Macular erythematous rash left axilla with macular border. Looks like contact dermatitis and not fungal. Straight leg raising is negative at 90 degrees bilaterally.  Her muscle strength is normal.  No pain with internal and external rotation of the right hip.         Assessment & Plan:  Right hip pain-rule out osteoarthritis.  Will get plain film x-ray.  Will treat with prednisone tapering course 10 mg going from 60 mg to 0 mg over 7 days.  Continue Cymbalta total of 90 mg daily.  She wants to see neurologist.  Plain film is negative for osteoarthritis and right hip. She has radiculopathy.  Rash in left axilla looks to be contact dermatitis with border that is not raised.  Treated sparingly with triamcinolone cream 0.1% twice daily until healed.  Fatigue-labs drawn including TSH, C met CBC and hemoglobin A1c all of which are within normal limits.   Urine dipstick is also normal.  Do not know why she is fatigued unless it is related to being confined with pandemic.  We will arrange for neurological consultation as she requests.  She may need physical therapy for hip pain.  We will see if she responds to prednisone.

## 2018-09-02 ENCOUNTER — Encounter: Payer: Self-pay | Admitting: Internal Medicine

## 2018-09-02 LAB — COMPLETE METABOLIC PANEL WITH GFR
AG Ratio: 2 (calc) (ref 1.0–2.5)
ALT: 16 U/L (ref 6–29)
AST: 15 U/L (ref 10–35)
Albumin: 4.7 g/dL (ref 3.6–5.1)
Alkaline phosphatase (APISO): 63 U/L (ref 37–153)
BUN/Creatinine Ratio: 33 (calc) — ABNORMAL HIGH (ref 6–22)
BUN: 27 mg/dL — ABNORMAL HIGH (ref 7–25)
CO2: 22 mmol/L (ref 20–32)
Calcium: 9.8 mg/dL (ref 8.6–10.4)
Chloride: 106 mmol/L (ref 98–110)
Creat: 0.82 mg/dL (ref 0.60–0.93)
GFR, Est African American: 82 mL/min/{1.73_m2} (ref >=60)
GFR, Est Non African American: 71 mL/min/{1.73_m2} (ref >=60)
Globulin: 2.4 g/dL (calc) (ref 1.9–3.7)
Glucose, Bld: 92 mg/dL (ref 65–99)
Potassium: 4.5 mmol/L (ref 3.5–5.3)
Sodium: 140 mmol/L (ref 135–146)
Total Bilirubin: 0.6 mg/dL (ref 0.2–1.2)
Total Protein: 7.1 g/dL (ref 6.1–8.1)

## 2018-09-02 LAB — CBC WITH DIFFERENTIAL/PLATELET
Absolute Monocytes: 410 cells/uL (ref 200–950)
Basophils Absolute: 50 cells/uL (ref 0–200)
Basophils Relative: 1 %
Eosinophils Absolute: 220 cells/uL (ref 15–500)
Eosinophils Relative: 4.4 %
HCT: 44.6 % (ref 35.0–45.0)
Hemoglobin: 14.8 g/dL (ref 11.7–15.5)
Lymphs Abs: 1295 cells/uL (ref 850–3900)
MCH: 30.3 pg (ref 27.0–33.0)
MCHC: 33.2 g/dL (ref 32.0–36.0)
MCV: 91.2 fL (ref 80.0–100.0)
MPV: 10.6 fL (ref 7.5–12.5)
Monocytes Relative: 8.2 %
Neutro Abs: 3025 cells/uL (ref 1500–7800)
Neutrophils Relative %: 60.5 %
Platelets: 190 10*3/uL (ref 140–400)
RBC: 4.89 10*6/uL (ref 3.80–5.10)
RDW: 12.3 % (ref 11.0–15.0)
Total Lymphocyte: 25.9 %
WBC: 5 10*3/uL (ref 3.8–10.8)

## 2018-09-02 LAB — HEMOGLOBIN A1C
Hgb A1c MFr Bld: 5.4 % of total Hgb (ref <5.7)
Mean Plasma Glucose: 108 (calc)
eAG (mmol/L): 6 (calc)

## 2018-09-02 LAB — TSH: TSH: 2.26 mIU/L (ref 0.40–4.50)

## 2018-09-02 MED ORDER — PREDNISONE 10 MG PO TABS: ORAL_TABLET | ORAL | 0 refills | Status: DC

## 2018-09-02 MED ORDER — TRIAMCINOLONE ACETONIDE 0.1 % EX CREA: 1.0000 "application " | TOPICAL_CREAM | Freq: Two times a day (BID) | CUTANEOUS | 0 refills | Status: DC

## 2018-09-02 NOTE — Patient Instructions (Addendum)
Take prednisone in tapering course over 6 days.  Continue Cymbalta.  Lab work drawn within normal limits because of complaint of fatigue.  Use triamcinolone cream 0.1% sparingly in left axilla for rash.  Referral to neurologist at patient request

## 2018-09-10 ENCOUNTER — Other Ambulatory Visit: Payer: Self-pay

## 2018-09-11 ENCOUNTER — Encounter: Payer: Self-pay | Admitting: Gynecology

## 2018-09-11 ENCOUNTER — Ambulatory Visit (INDEPENDENT_AMBULATORY_CARE_PROVIDER_SITE_OTHER): Payer: Medicare Other | Admitting: Gynecology

## 2018-09-11 VITALS — BP 120/74

## 2018-09-11 DIAGNOSIS — N3000 Acute cystitis without hematuria: Secondary | ICD-10-CM | POA: Diagnosis not present

## 2018-09-11 MED ORDER — NITROFURANTOIN MONOHYD MACRO 100 MG PO CAPS: 100.0000 mg | ORAL_CAPSULE | Freq: Two times a day (BID) | ORAL | 0 refills | Status: DC

## 2018-09-11 NOTE — Patient Instructions (Signed)
Take the prescribed antibiotic twice daily for 7 days.  Follow-up if your symptoms persist, worsen or recur. 

## 2018-09-11 NOTE — Progress Notes (Signed)
    [name] 17-Aug-1945 [id number]        73 y.o.  G2P2002 presents with a week's history of dysuria and urinary frequency and urgency.  Having some new onset urinary incontinence while heading to the bathroom.  No vaginal discharge or irritation.  No fever chills low back pain.  Past medical history,surgical history, problem list, medications, allergies, family history and social history were all reviewed and documented in the EPIC chart.  Directed ROS with pertinent positives and negatives documented in the history of present illness/assessment and plan.  Exam:  Vitals:   09/11/18 1526  BP: 120/74   General appearance:  Normal Spine straight without CVA tenderness Abdomen soft nontender without mass guarding rebound  Assessment/Plan:  73 y.o. [id number] with history and urine analysis consistent with UTI.  Will treat with Macrobid 100 mg twice daily x7 days.  Follow-up if symptoms persist, worsen or recur.    Anastasio Auerbach MD, 3:42 PM 09/11/2018

## 2018-09-11 NOTE — Addendum Note (Signed)
Addended by: Nelva Nay on: 09/11/2018 04:07 PM   Modules accepted: Orders

## 2018-09-13 LAB — URINALYSIS, COMPLETE W/RFL CULTURE
Bilirubin Urine: NEGATIVE
Glucose, UA: NEGATIVE
Hyaline Cast: NONE SEEN /LPF
Ketones, ur: NEGATIVE
Nitrites, Initial: NEGATIVE
Protein, ur: NEGATIVE
Specific Gravity, Urine: 1.025 (ref 1.001–1.03)
pH: 6 (ref 5.0–8.0)

## 2018-09-13 LAB — URINE CULTURE
MICRO NUMBER:: 827650
SPECIMEN QUALITY:: ADEQUATE

## 2018-09-13 LAB — CULTURE INDICATED

## 2018-09-14 ENCOUNTER — Other Ambulatory Visit: Payer: Self-pay | Admitting: Internal Medicine

## 2018-09-14 ENCOUNTER — Other Ambulatory Visit: Payer: Self-pay

## 2018-09-14 MED ORDER — DULOXETINE HCL 30 MG PO CPEP: ORAL_CAPSULE | ORAL | 3 refills | Status: DC

## 2018-09-14 MED ORDER — LOSARTAN POTASSIUM 50 MG PO TABS: 50.0000 mg | ORAL_TABLET | Freq: Every day | ORAL | 1 refills | Status: DC

## 2018-09-15 ENCOUNTER — Other Ambulatory Visit: Payer: Self-pay

## 2018-09-15 MED ORDER — DULOXETINE HCL 60 MG PO CPEP: 60.0000 mg | ORAL_CAPSULE | Freq: Every day | ORAL | 3 refills | Status: DC

## 2018-09-16 ENCOUNTER — Other Ambulatory Visit: Payer: Self-pay

## 2018-09-17 ENCOUNTER — Ambulatory Visit (INDEPENDENT_AMBULATORY_CARE_PROVIDER_SITE_OTHER): Payer: Medicare Other

## 2018-09-17 ENCOUNTER — Other Ambulatory Visit: Payer: Self-pay | Admitting: Gynecology

## 2018-09-17 ENCOUNTER — Other Ambulatory Visit: Payer: Self-pay

## 2018-09-17 DIAGNOSIS — Z780 Asymptomatic menopausal state: Secondary | ICD-10-CM

## 2018-09-17 DIAGNOSIS — Z01419 Encounter for gynecological examination (general) (routine) without abnormal findings: Secondary | ICD-10-CM

## 2018-09-18 ENCOUNTER — Encounter: Payer: Self-pay | Admitting: Gynecology

## 2018-09-28 ENCOUNTER — Other Ambulatory Visit: Payer: Self-pay

## 2018-09-28 ENCOUNTER — Other Ambulatory Visit: Payer: Medicare Other | Admitting: Internal Medicine

## 2018-09-28 DIAGNOSIS — E7800 Pure hypercholesterolemia, unspecified: Secondary | ICD-10-CM | POA: Diagnosis not present

## 2018-09-28 DIAGNOSIS — I341 Nonrheumatic mitral (valve) prolapse: Secondary | ICD-10-CM

## 2018-09-28 DIAGNOSIS — R7302 Impaired glucose tolerance (oral): Secondary | ICD-10-CM | POA: Diagnosis not present

## 2018-09-28 DIAGNOSIS — G4733 Obstructive sleep apnea (adult) (pediatric): Secondary | ICD-10-CM | POA: Diagnosis not present

## 2018-09-28 DIAGNOSIS — I10 Essential (primary) hypertension: Secondary | ICD-10-CM

## 2018-09-29 LAB — LIPID PANEL
Cholesterol: 162 mg/dL (ref <200)
HDL: 57 mg/dL (ref >=50)
LDL Cholesterol (Calc): 90 mg/dL (calc)
Non-HDL Cholesterol (Calc): 105 mg/dL (calc) (ref <130)
Total CHOL/HDL Ratio: 2.8 (calc) (ref <5.0)
Triglycerides: 61 mg/dL (ref <150)

## 2018-09-29 LAB — CBC WITH DIFFERENTIAL/PLATELET
Absolute Monocytes: 285 cells/uL (ref 200–950)
Basophils Absolute: 31 cells/uL (ref 0–200)
Basophils Relative: 0.8 %
Eosinophils Absolute: 191 cells/uL (ref 15–500)
Eosinophils Relative: 4.9 %
HCT: 41.6 % (ref 35.0–45.0)
Hemoglobin: 13.6 g/dL (ref 11.7–15.5)
Lymphs Abs: 920 cells/uL (ref 850–3900)
MCH: 29.8 pg (ref 27.0–33.0)
MCHC: 32.7 g/dL (ref 32.0–36.0)
MCV: 91.2 fL (ref 80.0–100.0)
MPV: 10 fL (ref 7.5–12.5)
Monocytes Relative: 7.3 %
Neutro Abs: 2473 cells/uL (ref 1500–7800)
Neutrophils Relative %: 63.4 %
Platelets: 191 10*3/uL (ref 140–400)
RBC: 4.56 10*6/uL (ref 3.80–5.10)
RDW: 12.3 % (ref 11.0–15.0)
Total Lymphocyte: 23.6 %
WBC: 3.9 10*3/uL (ref 3.8–10.8)

## 2018-09-29 LAB — COMPLETE METABOLIC PANEL WITH GFR
AG Ratio: 2.3 (calc) (ref 1.0–2.5)
ALT: 14 U/L (ref 6–29)
AST: 13 U/L (ref 10–35)
Albumin: 4.3 g/dL (ref 3.6–5.1)
Alkaline phosphatase (APISO): 53 U/L (ref 37–153)
BUN: 19 mg/dL (ref 7–25)
CO2: 25 mmol/L (ref 20–32)
Calcium: 9 mg/dL (ref 8.6–10.4)
Chloride: 110 mmol/L (ref 98–110)
Creat: 0.69 mg/dL (ref 0.60–0.93)
GFR, Est African American: 100 mL/min/{1.73_m2} (ref >=60)
GFR, Est Non African American: 86 mL/min/{1.73_m2} (ref >=60)
Globulin: 1.9 g/dL (calc) (ref 1.9–3.7)
Glucose, Bld: 91 mg/dL (ref 65–99)
Potassium: 4.5 mmol/L (ref 3.5–5.3)
Sodium: 142 mmol/L (ref 135–146)
Total Bilirubin: 0.7 mg/dL (ref 0.2–1.2)
Total Protein: 6.2 g/dL (ref 6.1–8.1)

## 2018-09-29 LAB — HEMOGLOBIN A1C
Hgb A1c MFr Bld: 5.4 % of total Hgb (ref <5.7)
Mean Plasma Glucose: 108 (calc)
eAG (mmol/L): 6 (calc)

## 2018-09-29 LAB — TSH: TSH: 1.69 mIU/L (ref 0.40–4.50)

## 2018-10-01 ENCOUNTER — Encounter: Payer: Self-pay | Admitting: Internal Medicine

## 2018-10-01 ENCOUNTER — Other Ambulatory Visit: Payer: Self-pay

## 2018-10-01 ENCOUNTER — Ambulatory Visit (INDEPENDENT_AMBULATORY_CARE_PROVIDER_SITE_OTHER): Payer: Medicare Other | Admitting: Internal Medicine

## 2018-10-01 VITALS — BP 110/70 | HR 72 | Temp 98.4°F | Ht 64.0 in | Wt 186.0 lb

## 2018-10-01 DIAGNOSIS — Z0000 Encounter for general adult medical examination without abnormal findings: Secondary | ICD-10-CM

## 2018-10-01 DIAGNOSIS — Z23 Encounter for immunization: Secondary | ICD-10-CM

## 2018-10-01 DIAGNOSIS — E7800 Pure hypercholesterolemia, unspecified: Secondary | ICD-10-CM

## 2018-10-01 DIAGNOSIS — R7302 Impaired glucose tolerance (oral): Secondary | ICD-10-CM

## 2018-10-01 DIAGNOSIS — M5416 Radiculopathy, lumbar region: Secondary | ICD-10-CM | POA: Diagnosis not present

## 2018-10-01 DIAGNOSIS — E8881 Metabolic syndrome: Secondary | ICD-10-CM

## 2018-10-01 DIAGNOSIS — G4733 Obstructive sleep apnea (adult) (pediatric): Secondary | ICD-10-CM | POA: Diagnosis not present

## 2018-10-01 DIAGNOSIS — I10 Essential (primary) hypertension: Secondary | ICD-10-CM | POA: Diagnosis not present

## 2018-10-01 DIAGNOSIS — I341 Nonrheumatic mitral (valve) prolapse: Secondary | ICD-10-CM | POA: Diagnosis not present

## 2018-10-01 DIAGNOSIS — Z96653 Presence of artificial knee joint, bilateral: Secondary | ICD-10-CM

## 2018-10-01 LAB — POCT URINALYSIS DIPSTICK
Appearance: NEGATIVE
Bilirubin, UA: NEGATIVE
Blood, UA: NEGATIVE
Glucose, UA: NEGATIVE
Ketones, UA: NEGATIVE
Leukocytes, UA: NEGATIVE
Nitrite, UA: NEGATIVE
Odor: NEGATIVE
Protein, UA: NEGATIVE
Spec Grav, UA: 1.01 (ref 1.010–1.025)
Urobilinogen, UA: 0.2 E.U./dL
pH, UA: 6.5 (ref 5.0–8.0)

## 2018-10-01 NOTE — Progress Notes (Signed)
Subjective:    Patient ID: [name], female    DOB: 05-10-1945, 73 y.o.   MRN: [id number]  HPI 73 year old Female for Medicare wellness, health maintenance exam and evaluation of medical issues.    Was seen in mid August regarding right hip pain.  Plain x-ray of the right hip was negative.  She was treated with a short course of prednisone.  Was thought that she probably had lumbar radiculopathy.  Has been on Cymbalta for many years for back pain.  Was referred to a neurologist and has an appointment on September 23.  Was complaining of fatigue but CBC C met TSH and hemoglobin A1c were normal.  History of right total knee arthroplasty February 2012.  Left knee arthroscopic surgery 2014.  Left knee arthroscopic surgery 2010 and another left knee arthroscopic surgery October 2011.  Surgery for torn meniscus right knee in 2006.  Had total left knee arthroplasty January 2018.  At L4-L5 microdiscectomy October 2004.  History of mitral valve prolapse with trace MR.  Has been on beta-blocker for years for palpitations and hypertension.  History of sleep apnea and uses CPAP.  Dr. Erling Cruz started her on Cymbalta 90 mg daily in the remote past and we have kept her on this for years.  Pain is in low back and radiates to right leg.  Fatigued however, prednisone did help some with the right lower extremity radiculopathy.  Had avulsion right great toenail June 2018 and avulsion tip of left index finger trimming shrubbery August 2019 seen by hand surgeon.  Had tetanus immunization update at that time.  History of hyperlipidemia and impaired glucose tolerance, GE reflux obesity and osteoarthritis.  Trace mitral regurgitation and mitral valve prolapse diagnosed in 1983 on echocardiogram.  Had benign right breast biopsy 1997, tonsillectomy and adenoidectomy at age 89, bilateral tubal ligation at age 32.  Right oophorectomy, D&C and uterine polypectomy 2002.  Sulfa causes hives.  Social history: She  is a widow.  Her husband died of brain cancer.  Social alcohol consumption.  She does not smoke.  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  2 adult daughters 1 of them is a Company secretary and the other one teaches music.  Several grandchildren.  Family history: Parents divorced when she was 69 years old.  She does not know father's family history.  Mother died at age 80 of heart failure kidney failure and scleroderma.  Patient is an only child.  Maternal grandmother died at age 77 of abdominal aortic aneurysm rupture.     Review of Systems see above main complaint is right radiculopathy     Objective:   Physical Exam Blood pressure 110/70, pulse 72, weight 186 pounds BMI 31.93.  Skin warm and dry.  Nodes none.  Neck is supple without JVD thyromegaly or carotid bruits.  Head is normocephalic atraumatic.  PERRLA.  TMs and pharynx are clear.  Chest is clear to auscultation without rales or wheezing.  Cardiac exam regular rate and rhythm normal S1 and S2 without murmurs rubs or gallops.  No click appreciated.  Breasts normal female without masses.  Abdomen soft nondistended without hepatosplenomegaly masses or tenderness.  Pap deferred due to age and bimanual is normal.  No lower extremity edema.  Affect is normal.  Judgment and thought process normal.  Neuro she has normal lower extremity muscle strength and sensation is intact.  Straight leg raising is  slightly positive on the right and negative on the left.       Assessment & Plan:  Right lumbar radiculopathy-to see neurologist in the near future.  Patient needs MRI of the LS spine for evaluation.  Continue Cymbalta for chronic back pain.  Status post bilateral knee arthroplasties and doing well  History of sleep apnea  Impaired glucose tolerance-hemoglobin A1c normal at 5.4%  Hyperlipidemia-lipid panel normal on statin medication  Essential hypertension treated with  losartan  Osteoarthritis treated with Voltaren gel  Plan: Neurology consultation.  Continue current medications.  Continue diet and exercise regimen.  Get plenty of exercise with yard work.  Follow-up in 6 months.  Subjective:   Patient presents for Medicare Annual/Subsequent preventive examination.  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 No  Over the past two weeks, have you felt little interest or pleasure in doing things? No Have you lost interest or pleasure in daily life? No Do you often feel hopeless? No Do you cry easily over simple problems? No   Activities of Daily Living  In your present state of health, do you have any difficulty performing the following activities?:   Driving? No  Managing money? No  Feeding yourself? No  Getting from bed to chair? No  Climbing a flight of stairs? No  Preparing food and eating?: No  Bathing or showering? No  Getting dressed: No  Getting to the toilet? No  Using the toilet:No  Moving around from place to place: No  In the past year have you fallen or had a near fall?:No  Are you sexually active? No  Do you have more than one partner? No   Hearing Difficulties: Yes Do you often ask people to speak up or repeat themselves?  Yes Do you experience ringing or noises in your ears?  Yes Do you have difficulty understanding soft or whispered voices?  Yes Do you feel that you have a problem with memory?  Sometimes Do you often misplace items?  Sometimes   Home Safety:  Do you have a smoke alarm at your residence? Yes Do you have  grab bars in the bathroom?  None     Cognitive Testing  Alert? Yes Normal Appearance?Yes  Oriented to person? Yes Place? Yes  Time? Yes  Recall of three objects? Yes  Can perform simple calculations? Yes  Displays appropriate judgment?Yes  Can read the correct time from a watch face?Yes   List the Names of Other Physician/Practitioners you currently use:  See referral list for the physicians patient is currently seeing.     Review of Systems: See above   Objective:     General appearance: Appears stated age and mildly obese  Head: Normocephalic, without obvious abnormality, atraumatic  Eyes: conj clear, EOMi PEERLA  Ears: normal TM's and external ear canals both ears  Nose: Nares normal. Septum midline. Mucosa normal. No drainage or sinus tenderness.  Throat: lips, mucosa, and tongue normal; teeth and gums normal  Neck: no adenopathy, no carotid bruit, no JVD, supple, symmetrical, trachea midline and thyroid not enlarged, symmetric, no tenderness/mass/nodules  No CVA tenderness.  Lungs: clear  to auscultation bilaterally  Breasts: normal appearance, no masses Heart: regular rate and rhythm, S1, S2 normal, no murmur, click, rub or gallop  Abdomen: soft, non-tender; bowel sounds normal; no masses, no organomegaly  Musculoskeletal: ROM normal in all joints, no crepitus, no deformity, Normal muscle strengthen. Back  is symmetric, no curvature. Skin: Skin color, texture, turgor normal. No rashes or lesions  Lymph nodes: Cervical, supraclavicular, and axillary nodes normal.  Neurologic: CN 2 -12 Normal, Normal symmetric reflexes. Normal coordination and gait  Psych: Alert & Oriented x 3, Mood appear stable.    Assessment:    Annual wellness medicare exam   Plan:    During the course of the visit the patient was educated and counseled about appropriate screening and preventive services including:   Annual flu vaccine  Annual mammogram     Patient Instructions (the  written plan) was given to the patient.  Medicare Attestation  I have personally reviewed:  The patient's medical and social history  Their use of alcohol, tobacco or illicit drugs  Their current medications and supplements  The patient's functional ability including ADLs,fall risks, home safety risks, cognitive, and hearing and visual impairment  Diet and physical activities  Evidence for depression or mood disorders  The patient's weight, height, BMI, and visual acuity have been recorded in the chart. I have made referrals, counseling, and provided education to the patient based on review of the above and I have provided the patient with a written personalized care plan for preventive services.

## 2018-10-01 NOTE — Progress Notes (Deleted)
   Subjective:    Patient ID: [name], female    DOB: 1945/08/11, 73 y.o.   MRN: [id number]  HPI    Review of Systems     Objective:   Physical Exam        Assessment & Plan:

## 2018-10-07 ENCOUNTER — Telehealth: Payer: Self-pay | Admitting: Neurology

## 2018-10-07 ENCOUNTER — Ambulatory Visit (INDEPENDENT_AMBULATORY_CARE_PROVIDER_SITE_OTHER): Payer: Medicare Other | Admitting: Neurology

## 2018-10-07 ENCOUNTER — Encounter: Payer: Self-pay | Admitting: Neurology

## 2018-10-07 ENCOUNTER — Other Ambulatory Visit: Payer: Self-pay

## 2018-10-07 VITALS — BP 124/72 | HR 66 | Ht 64.0 in | Wt 185.0 lb

## 2018-10-07 DIAGNOSIS — M5416 Radiculopathy, lumbar region: Secondary | ICD-10-CM | POA: Diagnosis not present

## 2018-10-07 DIAGNOSIS — M5431 Sciatica, right side: Secondary | ICD-10-CM

## 2018-10-07 DIAGNOSIS — F40240 Claustrophobia: Secondary | ICD-10-CM

## 2018-10-07 DIAGNOSIS — M25551 Pain in right hip: Secondary | ICD-10-CM

## 2018-10-07 MED ORDER — ALPRAZOLAM 0.5 MG PO TABS: ORAL_TABLET | ORAL | 0 refills | Status: DC

## 2018-10-07 NOTE — Patient Instructions (Signed)
We will do an EMG and nerve conduction velocity test, which is an electrical nerve and muscle test, which we will schedule. We will call you with the results. We will do a lumbar spine (i.e. lower back) MRI to look for degenerative changes and for signs or nerve root compression.  As discussed, I will call in Xanax to your pharmacy for your MRI.  You can fill it but hold onto it until the actual scan.  Please have someone drive you to and from the scan since Xanax is sedating and can impair your driving. You may benefit from seeing a spine specialist.  Let us review the test results and take it from there

## 2018-10-07 NOTE — Telephone Encounter (Signed)
Medicare/bcbs supp order sent to GI. No auth they will reach out to the patient to schedule.  

## 2018-10-07 NOTE — Progress Notes (Signed)
Subjective:    Patient ID: [name] is a 73 y.o. female.  HPI     [name], MD, PhD [facility] [address] [place], [place] [address]  Dear Dr. Renold Genta,    I saw your patient, [name], upon your kind request in my neurologic clinic today for consultation of her radiating back pain, concerning for radiculopathy. The patient is unaccompanied today. As you know, [name] is a 73 year old right-handed woman with an underlying medical history of arthritis, status post bilateral knee replacement surgeries, status post back surgery, hypertension, hyperlipidemia, mitral valve prolapse, tinnitus, and mild obesity, who reports radiating low back pain to the right hip and buttock area and sometimes even down the posterior aspect of her right leg to the toes for the past 2 or 3 months.  She has a baseline amount of low back pain essentially since her low back surgery and she had undergone injection treatments in the past without success.  This was even before she saw Dr. love in our office here.  She has been on Cymbalta with good success for the past several years but in the past 2 to 3 months she has noticed this additional radiating pain.  She has no permanent numbness or burning sensation in her feet.  She had a recent bone density scan under her GYN and was told it was fine.  She had lab work through your office and I reviewed the results from 09/28/18. She had a right hip x-ray recently which was negative for any acute bony abnormality.  I reviewed the report from 09/01/2018.  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  Her son-in-law comes from [place] to manage the car washes 4 days out of the week.  She also does her own yard work at home.  She has had a couple of near falls or  tripping incidences in her yard but no actual injuries.  The only problem with her fall or being on the floor or in the grass is that she cannot get up on her knees since her bilateral knee replacement surgeries.  It is difficult for her to get up.  She has residual right knee pain and scar tissue.  She had injection treatments 3 times in the lower back without success.  She had tried gabapentin in the distant past under Dr. Sherwood Gambler as I understand.  She takes 2 extra strength Tylenol when the pain is severe and using a heat pad also helps.  I reviewed your recent telephone encounter but was not able to review your office note from 10/01/2018.  Her lumbar spine MRI from 08/23/2014 showed: IMPRESSION: Postsurgical changes at L4-5 on the LEFT. Slight subarticular zone narrowing despite previous surgery. LEFT L5 nerve root impingement is possible.  Central and rightward disc extrusion at L3-L4. Mild facet arthropathy. RIGHT L4 nerve root impingement due to subarticular zone narrowing.  Her EMG and nerve conduction test from 08/15/2014 showed:  IMPRESSION:  Nerve conduction studies done on both lower extremities showed no clear evidence of a neuropathy. EMG evaluation of the left lower extremity shows chronic stable findings mainly in the left peroneal nerve distribution, this possibly may represent a chronic stable L5 radiculopathy. No evidence of a peroneal neuropathy is noted by nerve conduction studies.  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 Since her husband passed away in Apr 23, 2006 she has managed it herself with the help of her son-in-law.  Previously:   08/05/2014: 57 year old right-handed woman with an underlying medical history of asthmatic bronchitis, obesity, osteoarthritis, status post arthroscopic knee surgery on the left in January 2014 under Dr. Tonita Cong, and prior arthroscopic knee surgeries on the right , degenerative lumbar spine disease, status post lumbar spine surgery at  L4-L5 in 04-23-2007 under Dr. Sherwood Gambler, with evidence of degenerative disc disease in the lower spine and prior abnormal EMG and nerve conduction test in 04-23-2003 in keeping with mild denervation in the lumbosacral paraspinal muscles, who reports an approximately two-month history of radiating back pain to the left. Her pain is primarily in the left lateral thigh, radiating to the ankle or midfoot area. Sometimes she has tingling in the left big toe. Of note, she stays physically very active. She lives alone. She has 2 grown daughters. 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 Previously she had epidural steroidal injections which she felt were not helpful. In the past she had seen Dr. Erling Cruz in our office and was started on Cymbalta which she feels has been helpful. She is currently taking 90 mg daily. Sometimes she rubs the Voltaren gel on the areas that are sore and this helps as well.   She had a lumbar spine MRI with and without contrast on 04/28/2007: L3-4:  Disc bulge.  Facet arthropathy on the right that could be symptomatic.  See above discussion. L4-5:  Previous decompressive surgery on the left.  No apparent neural compression at this level. L5-S1:  Mild disc bulge and facet degeneration.  No stenosis. In addition, I personally reviewed the images through the PACS system.   For symptomatic treatment he recently prescribed a 6 day oral steroidal Dosepak as well as Norco 5-325 mg 3 times a day when necessary. She was then treated for asthmatic bronchitis with Biaxin and a longer course of starts which helped her leg pain and back pain as well.  Her Past Medical History Is Significant For: Past Medical History:  Diagnosis Date  . Arthritis   . Genital HSV 04/22/13   Positive HSV 1 genital PCR  . Heart murmur   . Hyperlipidemia   . Hypertension   . Mitral valve prolapse    since age 89  .  Mitral valve prolapse   . Overactive bladder   . Ringing in ears 04/22/08   interfers with clarity of hearing at times  . Sleep apnea    pt does not use CPAP  . Stress incontinence in female    wears a pad    Her Past Surgical History Is Significant For: Past Surgical History:  Procedure Laterality Date  . BACK SURGERY    . BREAST BIOPSY Left 08/07/2015   benign  . BREAST EXCISIONAL BIOPSY Right 01/27/1996   benign  . BREAST SURGERY     BENIGN RIGHT BR MASS,Bx's X 2  . FOOT SURGERY  23-Apr-2011   CORRECTION OF "HAMMER TOES" -LEFT FOOT  . HAND SURGERY     RIGHT THUMB BONE SPUR  . JOINT REPLACEMENT    . KNEE ARTHROSCOPY  2006, 2010, 2012   LEFT KNEE IN  2010/ RIGHT KNEE IN 2004-04-22 AND 2010-04-23  . KNEE ARTHROSCOPY  01/24/2012   Procedure: ARTHROSCOPY KNEE;  Surgeon: Johnn Hai, MD;  Location: [facility];  Service: Orthopedics;  Laterality: Left;  left knee arthroscopy with debridment, partial lateral menisectomy  . OOPHORECTOMY     RIGHT OVARY REMOVED  . SPINE SURGERY    . TONSILLECTOMY    . TOTAL KNEE ARTHROPLASTY  2012   Right  . TOTAL KNEE ARTHROPLASTY Left 01/25/2016   Procedure: LEFT TOTAL KNEE ARTHROPLASTY, EVALUATION UNDER ANESTHESIA AND MANIPULATION UNDER ANESTHESIA;  Surgeon: Susa Day, MD;  Location: [facility];  Service: Orthopedics;  Laterality: Left;  . TRIGGER FINGER RELEASE Left 12/25/2017   Procedure: RELEASE TRIGGER FINGER/A-1 PULLEY LEFT MIDDLE FINGER;  Surgeon: Daryll Brod, MD;  Location: [facility];  Service: Orthopedics;  Laterality: Left;  . TUBAL LIGATION      Her Family History Is Significant For: Family History  Problem Relation Age of Onset  . Hypertension Mother   . Heart disease Mother   . Kidney failure Mother   . Other Mother        sclaraderma  . Diabetes Maternal Grandmother        TYPE 2  . Hypertension Maternal Grandmother   . Other Maternal Grandmother        abdominal anersym  . Heart attack Father     Her  Social History Is Significant For: Social History   Socioeconomic History  . Marital status: Widowed    Spouse name: Not on file  . Number of children: 2  . Years of education: Clg, Therapist, sports  . Highest education level: Not on file  Occupational History  . Not on file  Social Needs  . Financial resource strain: Not on file  . Food insecurity    Worry: Not on file    Inability: Not on file  . Transportation needs    Medical: Not on file    Non-medical: Not on file  Tobacco Use  . Smoking status: Never Smoker  . Smokeless tobacco: Never Used  Substance and Sexual Activity  . Alcohol use: Yes    Comment: wine socially  . Drug use: No  . Sexual activity: Not Currently    Birth control/protection: Post-menopausal    Comment: 1st intercourse 83 yo-2 partners  Lifestyle  . Physical activity    Days per week: Not on file    Minutes per session: Not on file  . Stress: Not on file  Relationships  . Social Herbalist on phone: Not on file    Gets together: Not on file    Attends religious service: Not on file    Active member of club or organization: Not on file    Attends meetings of clubs or organizations: Not on file    Relationship status: Not on file  Other Topics Concern  . Not on file  Social History Narrative   Drinks about 2 cups of coffee a day, 1 glass of tea a day     Her Allergies Are:  Allergies  Allergen Reactions  . Sulfa Antibiotics Hives  . Codeine Itching  . Morphine Itching  :   Her Current Medications Are:  Outpatient Encounter Medications as of 10/07/2018  Medication Sig  . acetaminophen (TYLENOL) 500 MG tablet Take 1,000 mg by mouth every 6 (six) hours as needed (for pain).  . ALPHA-LIPOIC ACID PO Take 1 tablet by mouth daily.  . calcium-vitamin D (OSCAL WITH D) 500-200 MG-UNIT tablet Take 1 tablet by mouth at bedtime.  . cholecalciferol (VITAMIN D)  1000 units tablet Take 2,000 Units by mouth at bedtime.   . clobetasol cream (TEMOVATE) [id number]  % Apply 1 application topically 2 (two) times daily as needed (for irritation/dry skin).   . Coenzyme Q10 (CO Q 10 PO) Take 1 tablet by mouth daily.  Marland Kitchen desoximetasone (TOPICORT) 0.25 % cream Apply 1 application topically 2 (two) times daily as needed (for dry skin/itching).   Marland Kitchen docusate sodium (COLACE) 250 MG capsule Take 250 mg by mouth at bedtime.  . DULoxetine (CYMBALTA) 30 MG capsule TAKE 1 CAPSULE BY MOUTH EVERY DAY  . DULoxetine (CYMBALTA) 60 MG capsule Take 1 capsule (60 mg total) by mouth daily.  . fluticasone (CUTIVATE) 0.005 % ointment Apply 1 application topically 2 (two) times daily as needed (for dry skin/irritation.).   Marland Kitchen ketoconazole (NIZORAL) 2 % cream Apply topically daily.  Marland Kitchen losartan (COZAAR) 50 MG tablet Take 1 tablet (50 mg total) by mouth daily.  . magnesium gluconate (MAGONATE) 500 MG tablet Take 500 mg by mouth at bedtime.  . Omega-3 Fatty Acids (FISH OIL PO) Take 2 capsules by mouth 2 (two) times daily.   . simvastatin (ZOCOR) 20 MG tablet TAKE 1 TABLET BY MOUTH EVERY DAY  . triamcinolone cream (KENALOG) 0.1 % Apply 1 application topically 2 (two) times daily.  . TURMERIC PO Take 1 tablet by mouth at bedtime.   . VOLTAREN 1 % GEL Apply 2 g topically 2 (two) times daily.   . [DISCONTINUED] neomycin-polymyxin-hydrocortisone (CORTISPORIN) OTIC solution Apply 1-2 drops to toe after soaking BID   Facility-Administered Encounter Medications as of 10/07/2018  Medication  . clotrimazole-betamethasone (LOTRISONE) cream  :   Review of Systems:  Out of a complete 14 point review of systems, all are reviewed and negative with the exception of these symptoms as listed below:  Review of Systems  Neurological:       Pt presents today to discuss her right hip and back pain. It has worsened and the cymbalta is no longer helping her.    Objective:  Neurological Exam  Physical Exam Physical Examination:   Vitals:   10/07/18 0946  BP: 124/72  Pulse: 66    General  Examination: The patient is a very pleasant 73 y.o. female in no acute distress. She appears well-developed and well-nourished and well groomed.   HEENT: Normocephalic, atraumatic, pupils are equal, round and reactive to light and accommodation. Extraocular tracking is good without limitation to gaze excursion or nystagmus noted. Normal smooth pursuit is noted. Hearing is grossly intact. Face is symmetric with normal facial animation. Speech is clear with no dysarthria noted. There is no hypophonia. There is no lip, neck/head, jaw or voice tremor. Neck is supple with full range of passive and active motion. There are no carotid bruits on auscultation. Oropharynx exam reveals: mild mouth dryness, adequate dental hygiene.Tongue protrudes centrally in palate elevates symmetrically.  Chest: Clear to auscultation without wheezing, rhonchi or crackles noted.  Heart: S1+S2+0, regular and normal without murmurs, rubs or gallops noted.   Abdomen: Soft, non-tender and non-distended with normal bowel sounds appreciated on auscultation.  Extremities: There is no pitting edema in the distal lower extremities bilaterally. Pedal pulses are intact. She has unremarkable scars both knees from b/l total knee replacements. She has left hip height.  Skin: Warm and dry without trophic changes noted. There are no varicose veins.  Musculoskeletal: exam reveals Decreased range of motion right knee with difficulty with knee flexion.  She has increased thoracic kyphosis and perhaps mild  scoliosis.   Neurologically:  Mental status: The patient is awake, alert and oriented in all 4 spheres. Her immediate and remote memory, attention, language skills and fund of knowledge are appropriate. There is no evidence of aphasia, agnosia, apraxia or anomia. Speech is clear with normal prosody and enunciation. Thought process is linear. Mood is normal and affect is normal.  Cranial nerves II - XII are as described above under  HEENT exam. In addition: shoulder shrug is normal with equal shoulder height noted. Motor exam: Normal bulk, strength and tone is noted. There is no drift, tremor or rebound. Romberg is negative. Reflexes are 2+ in the upper extremities and 1+ in the Left knee, trace in the right knee and trace in both ankles.  She has no obvious foot drop or focal weakness.  Babinski testing shows toes are flexor bilaterally.  Fine motor skills with finger-to-nose is normal bilaterally, heel-to-shin is little little difficult with the right leg because of decreased range of motion in the knee.  No obvious cerebellar abnormalities such as dysmetria or intention tremor are noted.   Sensory exam: intact to light touch, pinprick, vibration, temperature sense in the upper and lower extremities.  Gait, station and balance: She stands with mild difficulty. No veering to one side is noted. No leaning to one side is noted. Posture is age-appropriate and stance is Slightly wider base.  She has no significant pain and no antalgic gait.  She walks slowly and cautiously.  She has preserved arm swing.  She does appear to have a slightly unequal hip height.   Assessment and Plan:   In summary, [name] is a very pleasant 73 year old female with an underlying medical history of arthritis, status post bilateral knee replacement surgeries, status post back surgery, hypertension, hyperlipidemia, mitral valve prolapse, tinnitus, and mild obesity, who presents for Evaluation of her radiating back pain, radiating to the right hip and sometimes all the way through to the foot.  She has had symptoms for the past 2 to 3 months.  She has a baseline Low back pain for years but feels that this has been different.  She has not had any incident that brought this on abruptly although she has tumbled in her yard a few times without obvious injuries.  She does have difficulty standing up because of limitation in her right knee mobility. On  examination, she has preserved reflexes and preserved muscle strength. She has No obvious abnormality on sensory exam.  Nevertheless, she can certainly have right-sided sciatica.  She has found some relief with extra strength Tylenol along with a heat pad. I did not suggest any new medications today. I suggested we Proceed with a lumbar spine MRI without contrast as well as an EMG and nerve conduction velocity test to the lower extremities.  I will prescribe Xanax for her MRI due to her claustrophobia. We will call her with her test results and go from there.  I answered all her questions today and she was in agreement. Thank you very much for allowing me to participate in the care of this nice patient. If I can be of any further assistance to you please do not hesitate to call me at [phone].  Sincerely,   [name], MD, PhD

## 2018-10-09 ENCOUNTER — Ambulatory Visit
Admission: RE | Admit: 2018-10-09 | Discharge: 2018-10-09 | Disposition: A | Discharge status: Home / Self Care | Payer: Medicare Other | Source: Ambulatory Visit | Attending: Neurology | Admitting: Neurology

## 2018-10-09 DIAGNOSIS — M25551 Pain in right hip: Secondary | ICD-10-CM

## 2018-10-09 DIAGNOSIS — M5431 Sciatica, right side: Secondary | ICD-10-CM

## 2018-10-09 DIAGNOSIS — M5416 Radiculopathy, lumbar region: Secondary | ICD-10-CM

## 2018-10-11 NOTE — Patient Instructions (Signed)
To see neurologist regarding radiculopathy.  Continue current medications and follow-up in 6 months.  Flu vaccine given.

## 2018-10-13 NOTE — Progress Notes (Signed)
Please call patient regarding her recent lumbar spine MRI.  She does have multilevel degenerative changes which is not new.  She has had some progression in her degenerative changes compared to her scan from 2016, particularly at level L3-L4.  She does have evidence of nerve root compression at level L4 which can cause part of her Right leg pain. We can certainly send her for evaluation to a spine specialist of her choosing.  We can make a referral to neurosurgery if she wishes. She may benefit from seeing Dr. Maryjean Ka for evaluation and treatment with a non-surgical approach. I can make a referral. Please let me know. Michel Bickers

## 2018-10-14 ENCOUNTER — Other Ambulatory Visit: Payer: Medicare Other

## 2018-10-14 ENCOUNTER — Telehealth: Payer: Self-pay

## 2018-10-14 DIAGNOSIS — M5416 Radiculopathy, lumbar region: Secondary | ICD-10-CM

## 2018-10-14 DIAGNOSIS — M5431 Sciatica, right side: Secondary | ICD-10-CM

## 2018-10-14 NOTE — Telephone Encounter (Signed)
Patient called had an MRI of her back done at [facility] and they found something on L3 & L4 they are recomending her to go see Dr. Maryjean Ka but she does not wan to go back to his office due to a bad experience she had in the past. She wants to know if you can recommend her to a different spine Chief of Staff.

## 2018-10-14 NOTE — Telephone Encounter (Signed)
I called [name]. I discussed her MRI results and recommendations. [name] would like a referral to Dr. Maryjean Ka. She reports that she is still in pain. [name] verbalized understanding of results. [name] had no questions at this time but was encouraged to call back if questions arise.

## 2018-10-14 NOTE — Telephone Encounter (Signed)
OK then she has to call Neurologist back and say that. The only other options are Dr. Nelva Bush at [facility] or Radiology. It looks like spinal stenosis and likely needs epidural steroid injection.

## 2018-10-14 NOTE — Telephone Encounter (Signed)
Patient notified

## 2018-10-14 NOTE — Telephone Encounter (Signed)
-----   Message from [name], MD sent at 10/13/2018  5:18 PM EDT ----- Please call patient regarding her recent lumbar spine MRI.  She does have multilevel degenerative changes which is not new.  She has had some progression in her degenerative changes compared to her scan from 2016, particularly at level L3-L4.  She does have evidence of nerve root compression at level L4 which can cause part of her Right leg pain. We can certainly send her for evaluation to a spine specialist of her choosing.  We can make a referral to neurosurgery if she wishes. She may benefit from seeing Dr. Maryjean Ka for evaluation and treatment with a non-surgical approach. I can make a referral. Please let me know. Michel Bickers

## 2018-10-21 ENCOUNTER — Encounter: Payer: Self-pay | Admitting: Gynecology

## 2018-10-27 DIAGNOSIS — Z5181 Encounter for therapeutic drug level monitoring: Secondary | ICD-10-CM | POA: Diagnosis not present

## 2018-10-27 DIAGNOSIS — M961 Postlaminectomy syndrome, not elsewhere classified: Secondary | ICD-10-CM | POA: Diagnosis not present

## 2018-10-27 DIAGNOSIS — M48061 Spinal stenosis, lumbar region without neurogenic claudication: Secondary | ICD-10-CM | POA: Diagnosis not present

## 2018-11-06 ENCOUNTER — Telehealth: Payer: Self-pay | Admitting: Neurology

## 2018-11-06 NOTE — Telephone Encounter (Signed)
Pt called wanting to know if the NCV/EMG is necessary since she already has the diagnosis of a pinched nerve. Please advise.

## 2018-11-09 NOTE — Telephone Encounter (Signed)
I called pt and discussed this with her. She will proceed with the NCV/EMG. Pt verbalized understanding of recommendations. Pt had no questions at this time but was encouraged to call back if questions arise.

## 2018-11-09 NOTE — Telephone Encounter (Signed)
I would recommend going through with the EMG and nerve conduction velocity test because it provides additional information regarding the peripheral nerves and also information about nerve root problems i.e. pinched nerve-like changes.  I think it would be helpful to have that information.

## 2018-11-10 NOTE — Telephone Encounter (Signed)
I will keep an eye out for any cancellations. Patient was also advised to keep [place] updated as far as her COVID-19 exposure.

## 2018-11-10 NOTE — Telephone Encounter (Signed)
Called pt for reminder call- pt states this past weekend she had been exposed to COVID-19. Advised the next opening is going to be December. Pt declined stated that she will have her injection done 11/16 and if the appt is after that date it "would be pointless". Please advise

## 2018-11-11 ENCOUNTER — Encounter: Payer: Medicare Other | Admitting: Neurology

## 2018-11-30 DIAGNOSIS — M5416 Radiculopathy, lumbar region: Secondary | ICD-10-CM | POA: Diagnosis not present

## 2018-12-15 ENCOUNTER — Telehealth: Payer: Self-pay | Admitting: Internal Medicine

## 2018-12-15 NOTE — Telephone Encounter (Signed)
Agree 

## 2018-12-15 NOTE — Telephone Encounter (Signed)
[name] [phone]  [name] called to say that she had 1 injection with Dr Maryjean Ka - for Compress L3 and it did not work. She seen one of her neighbors named [name] and she recommended going and having Theraputic Pilates that it had helped her. I suggested an office visit to discuss.

## 2018-12-22 ENCOUNTER — Ambulatory Visit (INDEPENDENT_AMBULATORY_CARE_PROVIDER_SITE_OTHER): Payer: Medicare Other | Admitting: Internal Medicine

## 2018-12-22 ENCOUNTER — Other Ambulatory Visit: Payer: Self-pay

## 2018-12-22 ENCOUNTER — Encounter: Payer: Self-pay | Admitting: Internal Medicine

## 2018-12-22 VITALS — BP 138/80 | HR 67 | Temp 97.7°F | Ht 64.0 in | Wt 200.0 lb

## 2018-12-22 DIAGNOSIS — M5416 Radiculopathy, lumbar region: Secondary | ICD-10-CM

## 2018-12-22 NOTE — Progress Notes (Signed)
   Subjective:    Patient ID: [name], female    DOB: April 09, 1945, 73 y.o.   MRN: [id number]  HPI  73 year old Female for discussion of right leg pain and lumbar radiculopathy.  For many years she has taken Cymbalta for chronic back pain with good results but over the summer, her back pain worsened.  She does not think Cymbalta is helping any longer.  She was interested in Pilates but found out insurance would not cover it so she has not been going.  She is in a fair amount of pain.  She went to see neurologist in late September and had MRI of the LS spine showing left laminectomy defect L4-L5 from surgery in 2010.  There was a loss of disc height, left paramedian disc protrusion and facet hypertrophy.  Moderately severe left lateral recess stenosis that could be causing L5 nerve root compression.  At L3-L4 there was a small right paramedian disc herniation that progressed since 2016 MRI.  Thought to be perhaps causing L4 nerve root compression.  No spinal stenosis.  L2-L3 there is mild disc bulge and mild facet hypertrophy.  This is causing mild foraminal narrowing and mild lateral recess stenosis but no nerve root compression or spinal stenosis.  L5-S1 minimal disc bulge and moderate facet hypertrophy with no foraminal narrowing and no nerve root compression or spinal stenosis.  She is at her wits end at this point.  She is scheduled to see Dr. Maryjean Ka in February.  She is agreeable to physical therapy at this point for some type of relief.  She is disinclined to have further back surgery at this point.  She was seen for right hip pain in August 2020.  X-ray of the hip was negative and was treated with prednisone in a tapering course over 7 days.  She was complaining of fatigue and labs were drawn which were normal.  At that time neurological consultation was requested.    Review of Systems see above complaining of low back pain with radiation to right leg     Objective:   Physical Exam  Blood pressure stable at 130/80, BMI 34.33 weight 200 pounds temperature 97.7  She has no lower extremity weakness.  Continues to have pain in right buttock.  No foot drop.  Does not have positive straight leg raising test.  Looks tired and fatigued.       Assessment & Plan:  Right sciatica-MRI showed multilevel lumbar disc disease.  She is willing to try physical therapy and we will arrange that.  Pilates was not covered by insurance.  She has appointment to see Dr. Maryjean Ka February 1.  If she does not improve, she will need to see neurosurgeon.  25 minutes spent with patient.

## 2018-12-28 NOTE — Telephone Encounter (Signed)
[name] called back to say that the piliates place she talked with you about is private pay and they are expensive. So she would like a referral to [facility] [place] there is someone there by the name of ginger and they do take insurances.  [name] also said she called to get an appointment with Dr Maryjean Ka for an injection and he does not have anything until sometime in January, however she has an appointment with nurse in his office for Wednesday, she will talk with her and also ask to be put on wait list, so maybe she could get one sooner.

## 2018-12-28 NOTE — Telephone Encounter (Signed)
Please put in a referral as she requested to [facility]

## 2018-12-29 NOTE — Telephone Encounter (Signed)
Referral placed.

## 2018-12-30 DIAGNOSIS — M5416 Radiculopathy, lumbar region: Secondary | ICD-10-CM | POA: Diagnosis not present

## 2018-12-30 DIAGNOSIS — M961 Postlaminectomy syndrome, not elsewhere classified: Secondary | ICD-10-CM | POA: Diagnosis not present

## 2019-01-01 ENCOUNTER — Ambulatory Visit: Payer: Medicare Other | Attending: Internal Medicine | Admitting: Physical Therapy

## 2019-01-01 ENCOUNTER — Other Ambulatory Visit: Payer: Self-pay

## 2019-01-01 DIAGNOSIS — M5441 Lumbago with sciatica, right side: Secondary | ICD-10-CM | POA: Insufficient documentation

## 2019-01-01 DIAGNOSIS — G8929 Other chronic pain: Secondary | ICD-10-CM | POA: Diagnosis present

## 2019-01-01 DIAGNOSIS — M6281 Muscle weakness (generalized): Secondary | ICD-10-CM | POA: Diagnosis present

## 2019-01-01 DIAGNOSIS — R262 Difficulty in walking, not elsewhere classified: Secondary | ICD-10-CM | POA: Diagnosis present

## 2019-01-01 NOTE — Therapy (Signed)
[facility] [address], [place], [place], [address] Phone: [phone]   Fax:  [phone]  Physical Therapy Evaluation  Patient Details  Name: [name] MRN: [id number] Date of Birth: 06-May-1945 Referring Provider (PT): Dr. Renold Genta Maryjean Ka in Feb 1 for injection)   Encounter Date: 01/01/2019  Visit 1 Medicare Progress note at visit 10  Certification date:  02/26/2019 9:30-10:15 Patient tolerated treatment well   Past Medical History:  Diagnosis Date  . Arthritis   . Genital HSV 03/2013   Positive HSV 1 genital PCR  . Heart murmur   . Hyperlipidemia   . Hypertension   . Mitral valve prolapse    since age 74  . Mitral valve prolapse   . Overactive bladder   . Ringing in ears 2010   interfers with clarity of hearing at times  . Sleep apnea    pt does not use CPAP  . Stress incontinence in female    wears a pad    Past Surgical History:  Procedure Laterality Date  . BACK SURGERY    . BREAST BIOPSY Left 08/07/2015   benign  . BREAST EXCISIONAL BIOPSY Right 01/27/1996   benign  . BREAST SURGERY     BENIGN RIGHT BR MASS,Bx's X 2  . FOOT SURGERY  2013   CORRECTION OF "HAMMER TOES" -LEFT FOOT  . HAND SURGERY     RIGHT THUMB BONE SPUR  . JOINT REPLACEMENT    . KNEE ARTHROSCOPY  2006, 2010, 2012   LEFT KNEE IN  2010/ RIGHT KNEE IN 2006 AND 2012  . KNEE ARTHROSCOPY  01/24/2012   Procedure: ARTHROSCOPY KNEE;  Surgeon: Johnn Hai, MD;  [facility];  Service: Orthopedics;  Laterality: Left;  left knee arthroscopy with debridment, partial lateral menisectomy  . OOPHORECTOMY     RIGHT OVARY REMOVED  . SPINE SURGERY    . TONSILLECTOMY    . TOTAL KNEE ARTHROPLASTY  2012   Right  . TOTAL KNEE ARTHROPLASTY Left 01/25/2016   Procedure: LEFT TOTAL KNEE ARTHROPLASTY, EVALUATION UNDER ANESTHESIA AND MANIPULATION UNDER ANESTHESIA;  Surgeon: Susa Day, MD;  Location: [facility];   Service: Orthopedics;  Laterality: Left;  . TRIGGER FINGER RELEASE Left 12/25/2017   Procedure: RELEASE TRIGGER FINGER/A-1 PULLEY LEFT MIDDLE FINGER;  Surgeon: Daryll Brod, MD;  Location: [facility];  Service: Orthopedics;  Laterality: Left;  . TUBAL LIGATION      There were no vitals filed for this visit.   Subjective Assessment - 01/01/19 0935    Subjective  The patient reports a failed back surgery in 2010.  Dr. Erling Cruz put me on Cymbalta that saved my life.  Until this Summer, was doing pretty well until back and LE pain returned.  I had to go to bed for 2 days.  Started in right hip, but will go across the back.  Right LE pain.  No numbness and tingling.  1 shot so far did nothing.    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  more than an hour    How long can you stand comfortably?  did some baking Wednesday  How long can you walk comfortably?  < 30 minutes  would increase to 5/10    Diagnostic tests  MRI L3-4 lateral recess stenosis with L4 nerve root compression;  L4-5 stenosis with L5 nerve root compression'  multi level disc bulge L1-3    Patient Stated Goals  a PT neighbor recommended Pilates so I want to try that ; to not hurt; be able to walk    Currently in Pain?  No/denies    Pain Score  0-No pain    Pain Location  Back    Pain Orientation  Right;Left    Pain Type  Chronic pain    Pain Radiating Towards  right LE pain    Pain Onset  More than a month ago    Pain Frequency  Intermittent    Aggravating Factors   standing, walking; bending over; lying on my back    Pain Relieving Factors  sitting ; heated car seats; sidelying         [facility] PT Assessment -  01/01/19 0001      Assessment   Medical Diagnosis  back pain     Referring Provider (PT)  Dr. Renold Genta Maryjean Ka in Feb 1 for injection)    Onset Date/Surgical Date  --   Summer   Next MD Visit  as needed     Prior Therapy  for TKRs      Precautions   Precautions  Fall      Restrictions   Weight Bearing Restrictions  Yes      Balance Screen   Has the patient fallen in the past 6 months  Yes    How many times?  1   tripped and fell outside while putting up decorations   Has the patient had a decrease in activity level because of a fear of falling?   No    Is the patient reluctant to leave their home because of a fear of falling?   No      Home Environment   Living Environment  Private residence    Living Arrangements  Alone    Type of [place] to enter    Home Layout  Two level      Prior Function   Level of Independence  Independent    Vocation  Part time employment    Vocation Requirements  sitting but Sun/Mon at the car washes     Leisure  gardening; used to dance prior to [employer]      Observation/Other Assessments   Focus on Therapeutic Outcomes (FOTO)   49% limitation       Posture/Postural Control   Posture/Postural Control  Postural limitations    Postural Limitations  Rounded Shoulders;Forward head;Decreased lumbar lordosis;Increased thoracic kyphosis    Posture Comments  pronounce right sided dorsal kyphsis       AROM   Overall AROM   --   Able to do KTC without back pain   Overall AROM Comments  Very limited right knee ROM: 15-50 degrees, left knee lacks 10 degrees of full extension     Lumbar Flexion  70    Lumbar Extension  10    Lumbar - Right Side Bend  5    Lumbar - Left Side Bend  5      Strength   Overall Strength Comments  Able to rise sit to stand without UE assist but lack of right knee flexion makes this difficult  Right Hip ABduction  4-/5    Left Hip ABduction  4-/5    Right Knee Flexion  4-/5    Right Knee  Extension  3+/5    Left Knee Flexion  4/5    Left Knee Extension  4/5    Lumbar Flexion  3+/5    Lumbar Extension  3+/5      Flexibility   Soft Tissue Assessment /Muscle Length  yes    Hamstrings  bil secondary to bil knee flexion contractures      Slump test   Findings  Negative      Straight Leg Raise   Findings  Negative                Objective measurements completed on examination: See above findings.                PT Short Term Goals - 01/01/19 1301      PT SHORT TERM GOAL #1   Title  The patient will demonstrate knowledge of basic self care strategies and initial HEP    Time  4    Period  Weeks    Status  New    Target Date  01/29/19      PT SHORT TERM GOAL #2   Title  The patient will report a 25% reduction in pain with walking and standing needed for grocery shopping and work duties    Time  4    Period  Weeks    Status  New      PT SHORT TERM GOAL #3   Title  The patient will have improved FOTO functional outcome score to 45%    Time  4    Period  Weeks    Status  New      PT SHORT TERM GOAL #4   Title  The patient will have improved lumbar extension to 15 degrees and sidebending to 10 degrees needed for gardening/yardwork    Time  4    Period  Weeks    Status  New        PT Long Term Goals - 01/01/19 1304      PT LONG TERM GOAL #1   Title  The patient will be independent in safe self progression of HEP    Time  8    Period  Weeks    Status  New    Target Date  02/26/19      PT LONG TERM GOAL #2   Title  The patient will report pain level 4/10 or less with walking 30 min for grocery shopping    Time  8    Period  Weeks    Status  New      PT LONG TERM GOAL #3   Title  The patient have lumbar/pelvic/hip strength grossly 4/5 to 4+/5 needed for lifting the cat litter or the heavy vacumn cleaner    Time  8    Period  Weeks    Status  New      PT LONG TERM GOAL #4   Title  FOTO functional outcome score improved to 40%     Time  8    Period  Weeks    Status  New             Plan - 01/01/19 1242    Clinical Impression Statement  The patient is a pleasant 73 year old who had a previous failed back surgery in 2012.  After  starting on Cymbalta, she did fairly well for years until this Summer when the pain returned initially on the right side, then across her back and then radiating down her right leg.  An MRI in Sept shows multi level degeneration including retrolisthesis and stenosis at L3-4 and L4-5.  Her pain is worsened with walking, standing, bending and lying on her back.  She is better with sitting and sidelying.  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   She has increased right dorsal kyphosis, decreased lumbar lordosis.  Full standing lumbar flexion but very limited extension and sidebending.  She has a right knee contracture with limited flexion as well as extension.  Decreased lumbo/pelvic/hip and knee strength grossly 3+/5 to 4-/5 throughout.  She has difficulty lifting the heavy vacumn or cat litter.  Difficulty activating transverse abdominus muscle activation.  She would benefit from PT to address these deficits.    Personal Factors and Comorbidities  Age;Past/Current Experience;Comorbidity 1;Comorbidity 2;Comorbidity 3+    Comorbidities  osteoporosis, previous back surgery;  bil knee stiffness/contracture    Examination-Activity Limitations  Caring for Others;Lift;Stand;Stairs;Squat;Sleep;Bend;Locomotion Level    Examination-Participation Restrictions  Church;Meal Prep;Cleaning;Community Activity;Shop;Yard Work    [employer]chant [employer] officer  Evolving/Moderate complexity    Clinical Decision Making  Moderate    Rehab Potential  Good    PT Frequency  2x / week    PT Duration  8 weeks    PT Treatment/Interventions  ADLs/Self Care Home Management;Aquatic  Therapy;Cryotherapy;Electrical Stimulation;Moist Heat;Ultrasound;Functional mobility training;Therapeutic activities;Therapeutic exercise;Neuromuscular re-education;Manual techniques;Patient/family education;Dry needling;Taping    PT Next Visit Plan  patient interested in Pilates style exercise; initiate HEP    Consulted and Agree with Plan of Care  Patient       Patient will benefit from skilled therapeutic intervention in order to improve the following deficits and impairments:  Decreased range of motion, Difficulty walking, Pain, Decreased activity tolerance, Impaired perceived functional ability, Decreased strength, Postural dysfunction  Visit Diagnosis: Chronic bilateral low back pain with right-sided sciatica - Plan: PT plan of care cert/re-cert  Muscle weakness (generalized) - Plan: PT plan of care cert/re-cert  Difficulty in walking, not elsewhere classified - Plan: PT plan of care cert/re-cert     Problem List Patient Active Problem List   Diagnosis Date Noted  . Essential hypertension 04/12/2018  . Pain in left foot 01/18/2017  . Trigger finger of left hand 01/18/2017  . Left knee DJD 01/25/2016  . Primary osteoarthritis of left knee 01/24/2012  . Decreased libido 11/05/2011  . Osteoarthritis of left knee 11/11/2010  . Impaired glucose tolerance 11/11/2010  . Vitamin D deficiency 11/11/2010  . Obesity 07/14/2010  . Mitral valve prolapse 07/14/2010  . Hyperlipemia 10/01/2007  . OBSTRUCTIVE SLEEP APNEA 10/01/2007   Ruben Im, PT 01/01/19 1:12 PM Phone: [phone] Fax: [phone] Alvera Singh 01/01/2019, 1:12 PM  [facility] [address] [place], [place], [address] Phone: [phone]   Fax:  [phone]  Name: BRONNIE FANTA MRN: [id number] Date of Birth: [birth year]

## 2019-01-06 ENCOUNTER — Ambulatory Visit: Payer: Medicare Other | Admitting: Physical Therapy

## 2019-01-06 ENCOUNTER — Encounter: Payer: Self-pay | Admitting: Physical Therapy

## 2019-01-06 ENCOUNTER — Other Ambulatory Visit: Payer: Self-pay

## 2019-01-06 DIAGNOSIS — M5441 Lumbago with sciatica, right side: Secondary | ICD-10-CM | POA: Diagnosis not present

## 2019-01-06 DIAGNOSIS — M6281 Muscle weakness (generalized): Secondary | ICD-10-CM

## 2019-01-06 DIAGNOSIS — G8929 Other chronic pain: Secondary | ICD-10-CM

## 2019-01-06 DIAGNOSIS — R262 Difficulty in walking, not elsewhere classified: Secondary | ICD-10-CM

## 2019-01-06 NOTE — Patient Instructions (Signed)

## 2019-01-06 NOTE — Therapy (Signed)
[facility] [address] [place], [place], [address] Phone: [phone]   Fax:  [phone]  Physical Therapy Treatment  Patient Details  Name: [name] MRN: [id number] Date of Birth: [birth year] Referring Provider (PT): Dr. Renold Genta Maryjean Ka in Feb 1 for injection)   Encounter Date: 01/06/2019  PT End of Session - 01/06/19 0933    Visit Number  2    Date for PT Re-Evaluation  02/26/19    Authorization Type  Medicare BCBS  Progress note at visit 10    PT Start Time  0931    PT Stop Time  1014    PT Time Calculation (min)  43 min    Activity Tolerance  Patient tolerated treatment well    Behavior During Therapy  [facility] for tasks assessed/performed       Past Medical History:  Diagnosis Date  . Arthritis   . Genital HSV 03/2013   Positive HSV 1 genital PCR  . Heart murmur   . Hyperlipidemia   . Hypertension   . Mitral valve prolapse    since age 28  . Mitral valve prolapse   . Overactive bladder   . Ringing in ears 2010   interfers with clarity of hearing at times  . Sleep apnea    pt does not use CPAP  . Stress incontinence in female    wears a pad    Past Surgical History:  Procedure Laterality Date  . BACK SURGERY    . BREAST BIOPSY Left 08/07/2015   benign  . BREAST EXCISIONAL BIOPSY Right 01/27/1996   benign  . BREAST SURGERY     BENIGN RIGHT BR MASS,Bx's X 2  . FOOT SURGERY  2013   CORRECTION OF "HAMMER TOES" -LEFT FOOT  . HAND SURGERY     RIGHT THUMB BONE SPUR  . JOINT REPLACEMENT    . KNEE ARTHROSCOPY  2006, 2010, 2012   LEFT KNEE IN  2010/ RIGHT KNEE IN 2006 AND 2012  . KNEE ARTHROSCOPY  01/24/2012   Procedure: ARTHROSCOPY KNEE;  Surgeon: Johnn Hai, MD;  Location: [facility];  Service: Orthopedics;  Laterality: Left;  left knee arthroscopy with debridment, partial lateral menisectomy  . OOPHORECTOMY     RIGHT OVARY REMOVED  . SPINE SURGERY    .  TONSILLECTOMY    . TOTAL KNEE ARTHROPLASTY  2012   Right  . TOTAL KNEE ARTHROPLASTY Left 01/25/2016   Procedure: LEFT TOTAL KNEE ARTHROPLASTY, EVALUATION UNDER ANESTHESIA AND MANIPULATION UNDER ANESTHESIA;  Surgeon: Susa Day, MD;  Location: [facility];  Service: Orthopedics;  Laterality: Left;  . TRIGGER FINGER RELEASE Left 12/25/2017   Procedure: RELEASE TRIGGER FINGER/A-1 PULLEY LEFT MIDDLE FINGER;  Surgeon: Daryll Brod, MD;  Location: [place];  Service: Orthopedics;  Laterality: Left;  . TUBAL LIGATION      There were no vitals filed for this visit.  Subjective Assessment - 01/06/19 0946    Subjective  just packed my car up so my Rt hip is hurting a bit. It is better now sitting here.    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  more than an hour  How long can you stand comfortably?  did some baking Wednesday    Diagnostic tests  MRI L3-4 lateral recess stenosis with L4 nerve root compression;  L4-5 stenosis with L5 nerve root compression'  multi level disc bulge L1-3    Patient Stated Goals  a PT neighbor recommended Pilates so I want to try that ; to not hurt; be able to walk    Currently in Pain?  No/denies   Not sitting here right now. RT hip hurt ealier packing car                              PT Education - 01/06/19 0945    Education Details  pilates based supine introductory core activation series with LE movements    Person(s) Educated  Patient    Methods  Explanation;Demonstration;Tactile cues;Verbal cues;Handout    Comprehension  Verbalized understanding;Returned demonstration;Verbal cues required       PT Short Term Goals -  01/01/19 1301      PT SHORT TERM GOAL #1   Title  The patient will demonstrate knowledge of basic self care strategies and initial HEP    Time  4    Period  Weeks    Status  New    Target Date  01/29/19      PT SHORT TERM GOAL #2   Title  The patient will report a 25% reduction in pain with walking and standing needed for grocery shopping and work duties    Time  4    Period  Weeks    Status  New      PT SHORT TERM GOAL #3   Title  The patient will have improved FOTO functional outcome score to 45%    Time  4    Period  Weeks    Status  New      PT SHORT TERM GOAL #4   Title  The patient will have improved lumbar extension to 15 degrees and sidebending to 10 degrees needed for gardening/yardwork    Time  4    Period  Weeks    Status  New        PT Long Term Goals - 01/01/19 1304      PT LONG TERM GOAL #1   Title  The patient will be independent in safe self progression of HEP    Time  8    Period  Weeks    Status  New    Target Date  02/26/19      PT LONG TERM GOAL #2   Title  The patient will report pain level 4/10 or less with walking 30 min for grocery shopping    Time  8    Period  Weeks    Status  New      PT LONG TERM GOAL #3   Title  The patient have lumbar/pelvic/hip strength grossly 4/5 to 4+/5 needed for lifting the cat litter or the heavy vacumn cleaner    Time  8    Period  Weeks    Status  New      PT LONG TERM GOAL #4   Title  FOTO functional outcome score improved to 40%    Time  8    Period  Weeks    Status  New            Plan - 01/06/19 0959    Clinical Impression Statement  Pt  arrives today just after packing her car for a 3 hour drive to [place]. She currently has no pain ( sitting here in clinic) but she did have hip pain when packing the car.We began her initial HEP that is based in Pilates principles and primarily focuses on core stabilization. Pt was able to demonstrate all exercises with good form, some reports of "feeling a little  something" only for this to abolish with making the LE movement small or a re-focus on connecting to her middle better.    Personal Factors and Comorbidities  Age;Past/Current Experience;Comorbidity 1;Comorbidity 2;Comorbidity 3+    Comorbidities  osteoporosis, previous back surgery;  bil knee stiffness/contracture    Examination-Activity Limitations  Caring for Others;Lift;Stand;Stairs;Squat;Sleep;Bend;Locomotion Level    Examination-Participation Restrictions  Church;Meal Prep;Cleaning;Community Activity;Shop;Yard Work    [employer]chant [employer] officer  Evolving/Moderate complexity    Rehab Potential  Good    PT Frequency  2x / week    PT Duration  8 weeks    PT Treatment/Interventions  ADLs/Self Care Home Management;Aquatic Therapy;Cryotherapy;Electrical Stimulation;Moist Heat;Ultrasound;Functional mobility training;Therapeutic activities;Therapeutic exercise;Neuromuscular re-education;Manual techniques;Patient/family education;Dry needling;Taping    PT Next Visit Plan  Review initial HEP, progress gently.    Consulted and Agree with Plan of Care  Patient       Patient will benefit from skilled therapeutic intervention in order to improve the following deficits and impairments:  Decreased range of motion, Difficulty walking, Pain, Decreased activity tolerance, Impaired perceived functional ability, Decreased strength, Postural dysfunction  Visit Diagnosis: Chronic bilateral low back pain with right-sided sciatica  Muscle weakness (generalized)  Difficulty in walking, not elsewhere classified     Problem List Patient Active Problem List   Diagnosis Date Noted  . Essential hypertension 04/12/2018  . Pain in left foot 01/18/2017  . Trigger finger of left hand 01/18/2017  . Left knee DJD 01/25/2016  . Primary osteoarthritis of left knee 01/24/2012  . Decreased libido 11/05/2011  . Osteoarthritis of left knee 11/11/2010  . Impaired glucose tolerance 11/11/2010  . Vitamin D  deficiency 11/11/2010  . Obesity 07/14/2010  . Mitral valve prolapse 07/14/2010  . Hyperlipemia 10/01/2007  . OBSTRUCTIVE SLEEP APNEA 10/01/2007    [name], PTA 01/06/2019, 10:15 AM  [facility] [address] [place], [place], [address] Phone: [phone]   Fax:  [phone]  Name: [name] MRN: [id number] Date of Birth: 1945-03-31

## 2019-01-14 ENCOUNTER — Telehealth: Payer: Self-pay | Admitting: Internal Medicine

## 2019-01-14 MED ORDER — SIMVASTATIN 20 MG PO TABS: 20.0000 mg | ORAL_TABLET | Freq: Every day | ORAL | 3 refills | Status: DC

## 2019-01-14 NOTE — Telephone Encounter (Signed)
Refill Statin med x one year to pharmacy by e-scribe [email or web address]

## 2019-01-20 ENCOUNTER — Other Ambulatory Visit: Payer: Self-pay

## 2019-01-20 ENCOUNTER — Ambulatory Visit: Payer: Medicare Other | Attending: Internal Medicine | Admitting: Physical Therapy

## 2019-01-20 ENCOUNTER — Encounter: Payer: Self-pay | Admitting: Physical Therapy

## 2019-01-20 DIAGNOSIS — M5441 Lumbago with sciatica, right side: Secondary | ICD-10-CM | POA: Insufficient documentation

## 2019-01-20 DIAGNOSIS — G8929 Other chronic pain: Secondary | ICD-10-CM | POA: Diagnosis not present

## 2019-01-20 DIAGNOSIS — R262 Difficulty in walking, not elsewhere classified: Secondary | ICD-10-CM

## 2019-01-20 DIAGNOSIS — M6281 Muscle weakness (generalized): Secondary | ICD-10-CM

## 2019-01-20 NOTE — Therapy (Signed)
[facility] [address] [place], [place], [address] Phone: [phone]   Fax:  [phone]  Physical Therapy Treatment  Patient Details  Name: [name] MRN: [id number] Date of Birth: [birth year] Referring Provider (PT): Dr. Renold Genta Maryjean Ka in Feb 1 for injection)   Encounter Date: 01/20/2019  PT End of Session - 01/20/19 1526    Visit Number  3    Date for PT Re-Evaluation  02/26/19    Authorization Type  Medicare BCBS  Progress note at visit 10    PT Start Time  1526    PT Stop Time  1605    PT Time Calculation (min)  39 min    Activity Tolerance  Patient tolerated treatment well    Behavior During Therapy  [facility] for tasks assessed/performed       Past Medical History:  Diagnosis Date  . Arthritis   . Genital HSV 03/2013   Positive HSV 1 genital PCR  . Heart murmur   . Hyperlipidemia   . Hypertension   . Mitral valve prolapse    since age 20  . Mitral valve prolapse   . Overactive bladder   . Ringing in ears 2010   interfers with clarity of hearing at times  . Sleep apnea    pt does not use CPAP  . Stress incontinence in female    wears a pad    Past Surgical History:  Procedure Laterality Date  . BACK SURGERY    . BREAST BIOPSY Left 08/07/2015   benign  . BREAST EXCISIONAL BIOPSY Right 01/27/1996   benign  . BREAST SURGERY     BENIGN RIGHT BR MASS,Bx's X 2  . FOOT SURGERY  2013   CORRECTION OF "HAMMER TOES" -LEFT FOOT  . HAND SURGERY     RIGHT THUMB BONE SPUR  . JOINT REPLACEMENT    . KNEE ARTHROSCOPY  2006, 2010, 2012   LEFT KNEE IN  2010/ RIGHT KNEE IN 2006 AND 2012  . KNEE ARTHROSCOPY  01/24/2012   Procedure: ARTHROSCOPY KNEE;  Surgeon: Johnn Hai, MD;  Location: [facility];  Service: Orthopedics;  Laterality: Left;  left knee arthroscopy with debridment, partial lateral menisectomy  . OOPHORECTOMY     RIGHT OVARY REMOVED  . SPINE SURGERY    . TONSILLECTOMY     . TOTAL KNEE ARTHROPLASTY  2012   Right  . TOTAL KNEE ARTHROPLASTY Left 01/25/2016   Procedure: LEFT TOTAL KNEE ARTHROPLASTY, EVALUATION UNDER ANESTHESIA AND MANIPULATION UNDER ANESTHESIA;  Surgeon: Susa Day, MD;  Location: [facility];  Service: Orthopedics;  Laterality: Left;  . TRIGGER FINGER RELEASE Left 12/25/2017   Procedure: RELEASE TRIGGER FINGER/A-1 PULLEY LEFT MIDDLE FINGER;  Surgeon: Daryll Brod, MD;  Location: [place];  Service: Orthopedics;  Laterality: Left;  . TUBAL LIGATION      There were no vitals filed for this visit.  Subjective Assessment - 01/20/19 1531    Subjective  I am doing my HEP consistently. I think the intensity of my pain is better.    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  Yes    Pain Score  2     Pain Location  Back    Pain Orientation  Left    Pain Descriptors / Indicators  Dull    Aggravating Factors   Too much stooping and bending    Pain Relieving Factors  Sitting, heating pad    Multiple Pain Sites  No                       [facility] Adult PT Treatment/Exercise - 01/20/19 0001      Lumbar Exercises: Aerobic   Nustep  L1 x 5 min   Seat 12 for knee     Lumbar Exercises: Supine   Ab Set  5 reps;2 seconds    AB Set Limitations  Review/good technique    Clam  10 reps    Bent Knee Raise  10 reps      Lumbar Exercises: Sidelying   Hip Abduction  --   Static hold 2 breaths, add Pilates breath 7 core contraction            PT Education - 01/20/19 1544    Education Details  Sidelying leg lift and hold    Person(s)  Educated  Patient    Methods  Explanation;Demonstration;Tactile cues;Verbal cues;Handout    Comprehension  Returned demonstration;Verbalized understanding       PT Short Term Goals - 01/01/19 1301      PT SHORT TERM GOAL #1   Title  The patient will demonstrate knowledge of basic self care strategies and initial HEP    Time  4    Period  Weeks    Status  New    Target Date  01/29/19      PT SHORT TERM GOAL #2   Title  The patient will report a 25% reduction in pain with walking and standing needed for grocery shopping and work duties    Time  4    Period  Weeks    Status  New      PT SHORT TERM GOAL #3   Title  The patient will have improved FOTO functional outcome score to 45%    Time  4    Period  Weeks    Status  New      PT SHORT TERM GOAL #4   Title  The patient will have improved lumbar extension to 15 degrees and sidebending to 10 degrees needed for gardening/yardwork    Time  4    Period  Weeks    Status  New        PT Long Term Goals - 01/01/19 1304      PT LONG TERM GOAL #1   Title  The patient will be independent in safe self progression of HEP    Time  8    Period  Weeks    Status  New    Target Date  02/26/19      PT LONG TERM GOAL #2   Title  The patient will report pain level 4/10 or less with walking 30 min for grocery shopping    Time  8    Period  Weeks    Status  New      PT LONG TERM GOAL #3   Title  The patient have lumbar/pelvic/hip strength grossly 4/5 to 4+/5 needed for lifting the cat litter or the heavy vacumn cleaner  Time  8    Period  Weeks    Status  New      PT LONG TERM GOAL #4   Title  FOTO functional outcome score improved to 40%    Time  8    Period  Weeks    Status  New            Plan - 01/20/19 1556    Clinical Impression Statement  Pt arrives today with no pain and reports that her pain intensity seems to be lessening overall. She has been very active as of late with reported no significant flare ups of  pain. She is independent and complaint with her HEP. We added to HEP today ( 1 exercise ) pt doesn't not wish to have to many exercises as her tendency is to not do them. Pt's RT knee is very stiff. She tends to compensate for this in her back ( QL).    Personal Factors and Comorbidities  Age;Past/Current Experience;Comorbidity 1;Comorbidity 2;Comorbidity 3+    Comorbidities  osteoporosis, previous back surgery;  bil knee stiffness/contracture    Examination-Activity Limitations  Caring for Others;Lift;Stand;Stairs;Squat;Sleep;Bend;Locomotion Level    Examination-Participation Restrictions  Church;Meal Prep;Cleaning;Community Activity;Shop;Yard Work    [employer] officer  Evolving/Moderate complexity    Rehab Potential  Good    PT Frequency  2x / week    PT Duration  8 weeks    PT Treatment/Interventions  ADLs/Self Care Home Management;Aquatic Therapy;Cryotherapy;Electrical Stimulation;Moist Heat;Ultrasound;Functional mobility training;Therapeutic activities;Therapeutic exercise;Neuromuscular re-education;Manual techniques;Patient/family education;Dry needling;Taping    PT Next Visit Plan  Review HEP, progress gently.    Consulted and Agree with Plan of Care  Patient       Patient will benefit from skilled therapeutic intervention in order to improve the following deficits and impairments:  Decreased range of motion, Difficulty walking, Pain, Decreased activity tolerance, Impaired perceived functional ability, Decreased strength, Postural dysfunction  Visit Diagnosis: Chronic bilateral low back pain with right-sided sciatica  Muscle weakness (generalized)  Difficulty in walking, not elsewhere classified     Problem List Patient Active Problem List   Diagnosis Date Noted  . Essential hypertension 04/12/2018  . Pain in left foot 01/18/2017  . Trigger finger of left hand 01/18/2017  . Left knee DJD 01/25/2016  . Primary osteoarthritis of left knee 01/24/2012  .  Decreased libido 11/05/2011  . Osteoarthritis of left knee 11/11/2010  . Impaired glucose tolerance 11/11/2010  . Vitamin D deficiency 11/11/2010  . Obesity 07/14/2010  . Mitral valve prolapse 07/14/2010  . Hyperlipemia 10/01/2007  . OBSTRUCTIVE SLEEP APNEA 10/01/2007    Edem Tiegs, PTA 01/20/2019, 4:12 PM  [facility] [address], [place], [place], [address] Phone: [phone]   Fax:  [phone]  Name: [name] MRN: [id number] Date of Birth: 1945-10-22

## 2019-01-20 NOTE — Patient Instructions (Signed)
New exercise:   Sidelying leg lift and hold  Lay on your true side/hips are stacked/aligned. Bottom knee is bent for extra balance  Inhale: as you exhale (blow out the candle)  Draw the core/lower abdominals up & in and then lift your top leg off the bottom leg.  Hold this for 1 full breath. Then lower the leg VERY slowly  Do 3x on each side 1 x a day.

## 2019-01-22 ENCOUNTER — Other Ambulatory Visit: Payer: Self-pay

## 2019-01-22 ENCOUNTER — Encounter: Payer: Self-pay | Admitting: Physical Therapy

## 2019-01-22 ENCOUNTER — Ambulatory Visit: Payer: Medicare Other | Admitting: Physical Therapy

## 2019-01-22 DIAGNOSIS — R262 Difficulty in walking, not elsewhere classified: Secondary | ICD-10-CM

## 2019-01-22 DIAGNOSIS — G8929 Other chronic pain: Secondary | ICD-10-CM | POA: Diagnosis not present

## 2019-01-22 DIAGNOSIS — M5441 Lumbago with sciatica, right side: Secondary | ICD-10-CM | POA: Diagnosis not present

## 2019-01-22 DIAGNOSIS — M6281 Muscle weakness (generalized): Secondary | ICD-10-CM | POA: Diagnosis not present

## 2019-01-22 NOTE — Therapy (Signed)
[facility] [address] [place], [place], [address] Phone: [phone]   Fax:  [phone]  Physical Therapy Treatment  Patient Details  Name: [name] MRN: [id number] Date of Birth: 1945/11/09 Referring Provider (PT): Dr. Renold Genta Maryjean Ka in Feb 1 for injection)   Encounter Date: 01/22/2019  PT End of Session - 01/22/19 1018    Visit Number  4    Date for PT Re-Evaluation  02/26/19    Authorization Type  Medicare BCBS  Progress note at visit 10    PT Start Time  1018    PT Stop Time  1103    PT Time Calculation (min)  45 min    Activity Tolerance  Patient tolerated treatment well    Behavior During Therapy  [facility] for tasks assessed/performed       Past Medical History:  Diagnosis Date  . Arthritis   . Genital HSV 03/2013   Positive HSV 1 genital PCR  . Heart murmur   . Hyperlipidemia   . Hypertension   . Mitral valve prolapse    since age 48  . Mitral valve prolapse   . Overactive bladder   . Ringing in ears 2010   interfers with clarity of hearing at times  . Sleep apnea    pt does not use CPAP  . Stress incontinence in female    wears a pad    Past Surgical History:  Procedure Laterality Date  . BACK SURGERY    . BREAST BIOPSY Left 08/07/2015   benign  . BREAST EXCISIONAL BIOPSY Right 01/27/1996   benign  . BREAST SURGERY     BENIGN RIGHT BR MASS,Bx's X 2  . FOOT SURGERY  2013   CORRECTION OF "HAMMER TOES" -LEFT FOOT  . HAND SURGERY     RIGHT THUMB BONE SPUR  . JOINT REPLACEMENT    . KNEE ARTHROSCOPY  2006, 2010, 2012   LEFT KNEE IN  2010/ RIGHT KNEE IN 2006 AND 2012  . KNEE ARTHROSCOPY  01/24/2012   Procedure: ARTHROSCOPY KNEE;  Surgeon: Johnn Hai, MD;  Location: [facility];  Service: Orthopedics;  Laterality: Left;  left knee arthroscopy with debridment, partial lateral menisectomy  . OOPHORECTOMY     RIGHT OVARY REMOVED  . SPINE SURGERY    . TONSILLECTOMY     . TOTAL KNEE ARTHROPLASTY  2012   Right  . TOTAL KNEE ARTHROPLASTY Left 01/25/2016   Procedure: LEFT TOTAL KNEE ARTHROPLASTY, EVALUATION UNDER ANESTHESIA AND MANIPULATION UNDER ANESTHESIA;  Surgeon: Susa Day, MD;  Location: [facility];  Service: Orthopedics;  Laterality: Left;  . TRIGGER FINGER RELEASE Left 12/25/2017   Procedure: RELEASE TRIGGER FINGER/A-1 PULLEY LEFT MIDDLE FINGER;  Surgeon: Daryll Brod, MD;  Location: [place];  Service: Orthopedics;  Laterality: Left;  . TUBAL LIGATION      There were no vitals filed for this visit.  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  No/denies                       [facility] Adult PT Treatment/Exercise - 01/22/19 0001      Lumbar Exercises: Stretches  Other Lumbar Stretch Exercise  Side hip stretch bil post leg lift Bil 10 sec 2x added for HEP      Lumbar Exercises: Aerobic   Nustep  L2 x 8 min   Seat 12     Lumbar Exercises: Standing   Other Standing Lumbar Exercises  Weight shifting on mini tramp toactivate multifius 1 min each way    Other Standing Lumbar Exercises  Tall walking for HEP in her home: 80 feet      Lumbar Exercises: Supine   Clam  10 reps    Clam Limitations  added red band for HEP progression    Gave pt band for home     Lumbar Exercises: Sidelying   Hip Abduction  --   Static holds with core contraction 3x bil: hold lift 2 breat     Manual Therapy   Manual Therapy  Soft tissue mobilization    Soft tissue mobilization  Addaday assist Rt hip post & lateral               PT Short Term Goals - 01/01/19 1301      PT SHORT TERM GOAL #1   Title  The patient will demonstrate knowledge of basic self care  strategies and initial HEP    Time  4    Period  Weeks    Status  New    Target Date  01/29/19      PT SHORT TERM GOAL #2   Title  The patient will report a 25% reduction in pain with walking and standing needed for grocery shopping and work duties    Time  4    Period  Weeks    Status  New      PT SHORT TERM GOAL #3   Title  The patient will have improved FOTO functional outcome score to 45%    Time  4    Period  Weeks    Status  New      PT SHORT TERM GOAL #4   Title  The patient will have improved lumbar extension to 15 degrees and sidebending to 10 degrees needed for gardening/yardwork    Time  4    Period  Weeks    Status  New        PT Long Term Goals - 01/01/19 1304      PT LONG TERM GOAL #1   Title  The patient will be independent in safe self progression of HEP    Time  8    Period  Weeks    Status  New    Target Date  02/26/19      PT LONG TERM GOAL #2   Title  The patient will report pain level 4/10 or less with walking 30 min for grocery shopping    Time  8    Period  Weeks    Status  New      PT LONG TERM GOAL #3   Title  The patient have lumbar/pelvic/hip strength grossly 4/5 to 4+/5 needed for lifting the cat litter or the heavy vacumn cleaner    Time  8    Period  Weeks    Status  New      PT LONG TERM GOAL #4   Title  FOTO functional outcome score improved to 40%    Time  8    Period  Weeks    Status  New  Plan - 01/22/19 1019    Clinical Impression Statement  Pt arrives today pain free and reporting compliance with ehr new HEP. She reports she "feels her hip muscles working and tightening." We added some stretching to balance out the strengthening exercise for her hips, Added some resistance with the red band to clamshells and added "Tall Waking" around her home to emphasize posture and core contraction together. This walking did produce some glute pain. This appears muscular as it abolished with soft tissue work using the  [place]. Pt reports she feels there is a relationship to her tight RT knee and RT hip pain. She would like to do the Nustep each session to help loosen up the knee in hopes of not overusing her Rt hip/back as much to compensate.    Personal Factors and Comorbidities  Age;Past/Current Experience;Comorbidity 1;Comorbidity 2;Comorbidity 3+    Comorbidities  osteoporosis, previous back surgery;  bil knee stiffness/contracture    Examination-Activity Limitations  Caring for Others;Lift;Stand;Stairs;Squat;Sleep;Bend;Locomotion Level    Examination-Participation Restrictions  Church;Meal Prep;Cleaning;Community Activity;Shop;Yard Work    [employer] officer  Evolving/Moderate complexity    Rehab Potential  Good    PT Frequency  2x / week    PT Duration  8 weeks    PT Treatment/Interventions  ADLs/Self Care Home Management;Aquatic Therapy;Cryotherapy;Electrical Stimulation;Moist Heat;Ultrasound;Functional mobility training;Therapeutic activities;Therapeutic exercise;Neuromuscular re-education;Manual techniques;Patient/family education;Dry needling;Taping    PT Next Visit Plan  Review how the Tall Walking at home is going? Is it causing more hip pain or ok? Continue with Nustep to help stretch the knee. Continue with core strength progression.    Consulted and Agree with Plan of Care  Patient       Patient will benefit from skilled therapeutic intervention in order to improve the following deficits and impairments:  Decreased range of motion, Difficulty walking, Pain, Decreased activity tolerance, Impaired perceived functional ability, Decreased strength, Postural dysfunction  Visit Diagnosis: Chronic bilateral low back pain with right-sided sciatica  Muscle weakness (generalized)  Difficulty in walking, not elsewhere classified     Problem List Patient Active Problem List   Diagnosis Date Noted  . Essential hypertension 04/12/2018  . Pain in left foot 01/18/2017  . Trigger  finger of left hand 01/18/2017  . Left knee DJD 01/25/2016  . Primary osteoarthritis of left knee 01/24/2012  . Decreased libido 11/05/2011  . Osteoarthritis of left knee 11/11/2010  . Impaired glucose tolerance 11/11/2010  . Vitamin D deficiency 11/11/2010  . Obesity 07/14/2010  . Mitral valve prolapse 07/14/2010  . Hyperlipemia 10/01/2007  . OBSTRUCTIVE SLEEP APNEA 10/01/2007    Sheron Robin, PTA  01/22/2019, 11:10 AM  [facility] [address], [place], [place], [address] Phone: [phone]   Fax:  [phone]  Name: [name] MRN: [id number] Date of Birth: 1945-06-06

## 2019-01-27 ENCOUNTER — Encounter: Payer: Self-pay | Admitting: Physical Therapy

## 2019-01-27 ENCOUNTER — Other Ambulatory Visit: Payer: Self-pay

## 2019-01-27 ENCOUNTER — Ambulatory Visit: Payer: Medicare Other | Admitting: Physical Therapy

## 2019-01-27 DIAGNOSIS — M5441 Lumbago with sciatica, right side: Secondary | ICD-10-CM | POA: Diagnosis not present

## 2019-01-27 DIAGNOSIS — G8929 Other chronic pain: Secondary | ICD-10-CM | POA: Diagnosis not present

## 2019-01-27 DIAGNOSIS — R262 Difficulty in walking, not elsewhere classified: Secondary | ICD-10-CM

## 2019-01-27 DIAGNOSIS — M6281 Muscle weakness (generalized): Secondary | ICD-10-CM | POA: Diagnosis not present

## 2019-01-27 NOTE — Therapy (Signed)
[facility] [address] [place], [place], [address] Phone: [phone]   Fax:  [phone]  Physical Therapy Treatment  Patient Details  Name: [name] MRN: [id number] Date of Birth: 02-Mar-1945 Referring Provider (PT): Dr. Renold Genta Maryjean Ka in Feb 1 for injection)   Encounter Date: 01/27/2019  PT End of Session - 01/27/19 1531    Visit Number  5    Date for PT Re-Evaluation  02/26/19    Authorization Type  Medicare BCBS  Progress note at visit 10    PT Start Time  1528    PT Stop Time  1609    PT Time Calculation (min)  41 min    Activity Tolerance  Patient tolerated treatment well    Behavior During Therapy  --       Past Medical History:  Diagnosis Date  . Arthritis   . Genital HSV 03/2013   Positive HSV 1 genital PCR  . Heart murmur   . Hyperlipidemia   . Hypertension   . Mitral valve prolapse    since age 50  . Mitral valve prolapse   . Overactive bladder   . Ringing in ears 2010   interfers with clarity of hearing at times  . Sleep apnea    pt does not use CPAP  . Stress incontinence in female    wears a pad    Past Surgical History:  Procedure Laterality Date  . BACK SURGERY    . BREAST BIOPSY Left 08/07/2015   benign  . BREAST EXCISIONAL BIOPSY Right 01/27/1996   benign  . BREAST SURGERY     BENIGN RIGHT BR MASS,Bx's X 2  . FOOT SURGERY  2013   CORRECTION OF "HAMMER TOES" -LEFT FOOT  . HAND SURGERY     RIGHT THUMB BONE SPUR  . JOINT REPLACEMENT    . KNEE ARTHROSCOPY  2006, 2010, 2012   LEFT KNEE IN  2010/ RIGHT KNEE IN 2006 AND 2012  . KNEE ARTHROSCOPY  01/24/2012   Procedure: ARTHROSCOPY KNEE;  Surgeon: Johnn Hai, MD;  Location: [facility];  Service: Orthopedics;  Laterality: Left;  left knee arthroscopy with debridment, partial lateral menisectomy  . OOPHORECTOMY     RIGHT OVARY REMOVED  . SPINE SURGERY    . TONSILLECTOMY    . TOTAL KNEE ARTHROPLASTY   2012   Right  . TOTAL KNEE ARTHROPLASTY Left 01/25/2016   Procedure: LEFT TOTAL KNEE ARTHROPLASTY, EVALUATION UNDER ANESTHESIA AND MANIPULATION UNDER ANESTHESIA;  Surgeon: Susa Day, MD;  Location: [facility];  Service: Orthopedics;  Laterality: Left;  . TRIGGER FINGER RELEASE Left 12/25/2017   Procedure: RELEASE TRIGGER FINGER/A-1 PULLEY LEFT MIDDLE FINGER;  Surgeon: Daryll Brod, MD;  Location: [place];  Service: Orthopedics;  Laterality: Left;  . TUBAL LIGATION      There were no vitals filed for this visit.  Subjective Assessment - 01/27/19 1532    Subjective  I was out raking a lot today. i did it, but now I have a sore back. I really don't have sharp pains in my hip anymore.    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  Yes    Pain Score  4  Pain Location  Back    Pain Orientation  Right;Lower    Pain Descriptors / Indicators  Sore    Multiple Pain Sites  No                       [facility] Adult PT Treatment/Exercise - 01/27/19 0001      Lumbar Exercises: Aerobic   Nustep  L2 x 10 min   Seat 12     Lumbar Exercises: Supine   Glut Set  --   adduction/TA/glute cocontraction hold 5 sec 10x: added to HE   Clam  --   2x10     Manual Therapy   Manual Therapy  Soft tissue mobilization    Soft tissue mobilization  Addaday assist Rt hip post & lateral             PT Education - 01/27/19 1611    Education Details  HEP addition: isometric adductor, TA, and glute    Person(s) Educated  Patient    Methods  Explanation;Demonstration;Verbal cues    Comprehension  Returned demonstration;Verbalized understanding       PT Short Term Goals - 01/27/19 1603      PT SHORT TERM GOAL #1   Title  The patient will  demonstrate knowledge of basic self care strategies and initial HEP    Time  4    Period  Weeks    Status  Achieved    Target Date  01/29/19        PT Long Term Goals - 01/01/19 1304      PT LONG TERM GOAL #1   Title  The patient will be independent in safe self progression of HEP    Time  8    Period  Weeks    Status  New    Target Date  02/26/19      PT LONG TERM GOAL #2   Title  The patient will report pain level 4/10 or less with walking 30 min for grocery shopping    Time  8    Period  Weeks    Status  New      PT LONG TERM GOAL #3   Title  The patient have lumbar/pelvic/hip strength grossly 4/5 to 4+/5 needed for lifting the cat litter or the heavy vacumn cleaner    Time  8    Period  Weeks    Status  New      PT LONG TERM GOAL #4   Title  FOTO functional outcome score improved to 40%    Time  8    Period  Weeks    Status  New            Plan - 01/27/19 1556    Clinical Impression Statement  Pt arrives with Rt low back pain today after doing " alot of raking" in the yard today. She understands the concepts of pacing her activities it is just hard to implement. Pt is compliant with new HEP and was given a new exercise that incorporated her gluteal muscles with her core muscles. Her Rt hip soft tissue was tight along  her posterior iliac crest and piriformis. Pt noted she was even tender near her sit bones. Pt had almost no pain at conclusion of session today.    Personal Factors and Comorbidities  Age;Past/Current Experience;Comorbidity 1;Comorbidity 2;Comorbidity 3+    Comorbidities  osteoporosis, previous back surgery;  bil knee stiffness/contracture    Examination-Activity Limitations  Caring for Others;Lift;Stand;Stairs;Squat;Sleep;Bend;Locomotion Level    Examination-Participation Restrictions  Church;Meal Prep;Cleaning;Community Activity;Shop;Yard Work    Rehab Potential  Good    PT Frequency  2x / week    PT Duration  8 weeks    PT  Treatment/Interventions  ADLs/Self Care Home Management;Aquatic Therapy;Cryotherapy;Electrical Stimulation;Moist Heat;Ultrasound;Functional mobility training;Therapeutic activities;Therapeutic exercise;Neuromuscular re-education;Manual techniques;Patient/family education;Dry needling;Taping    PT Next Visit Plan  Review how the Tall Walking at home is going? Is it causing more hip pain or ok? Continue with Nustep to help stretch the knee. Continue with core strength progression. Review STG #4    Consulted and Agree with Plan of Care  Patient       Patient will benefit from skilled therapeutic intervention in order to improve the following deficits and impairments:  Decreased range of motion, Difficulty walking, Pain, Decreased activity tolerance, Impaired perceived functional ability, Decreased strength, Postural dysfunction  Visit Diagnosis: Chronic bilateral low back pain with right-sided sciatica  Muscle weakness (generalized)  Difficulty in walking, not elsewhere classified     Problem List Patient Active Problem List   Diagnosis Date Noted  . Essential hypertension 04/12/2018  . Pain in left foot 01/18/2017  . Trigger finger of left hand 01/18/2017  . Left knee DJD 01/25/2016  . Primary osteoarthritis of left knee 01/24/2012  . Decreased libido 11/05/2011  . Osteoarthritis of left knee 11/11/2010  . Impaired glucose tolerance 11/11/2010  . Vitamin D deficiency 11/11/2010  . Obesity 07/14/2010  . Mitral valve prolapse 07/14/2010  . Hyperlipemia 10/01/2007  . OBSTRUCTIVE SLEEP APNEA 10/01/2007    Aztlan Coll, PTA 01/27/2019, 4:12 PM  [facility] [address] [place], [place], [address] Phone: [phone]   Fax:  [phone]  Name: [name] MRN: [id number] Date of Birth: 1945/11/20

## 2019-01-29 ENCOUNTER — Other Ambulatory Visit: Payer: Self-pay

## 2019-01-29 ENCOUNTER — Encounter: Payer: Self-pay | Admitting: Physical Therapy

## 2019-01-29 ENCOUNTER — Ambulatory Visit: Payer: Medicare Other | Admitting: Physical Therapy

## 2019-01-29 DIAGNOSIS — G8929 Other chronic pain: Secondary | ICD-10-CM | POA: Diagnosis not present

## 2019-01-29 DIAGNOSIS — M5441 Lumbago with sciatica, right side: Secondary | ICD-10-CM | POA: Diagnosis not present

## 2019-01-29 DIAGNOSIS — R262 Difficulty in walking, not elsewhere classified: Secondary | ICD-10-CM | POA: Diagnosis not present

## 2019-01-29 DIAGNOSIS — M6281 Muscle weakness (generalized): Secondary | ICD-10-CM

## 2019-01-29 NOTE — Therapy (Signed)
[facility] [address] [place], [place], [address] Phone: [phone]   Fax:  [phone]  Physical Therapy Treatment  Patient Details  Name: [name] MRN: [id number] Date of Birth: 15-Feb-1945 Referring Provider (PT): Dr. Renold Genta Maryjean Ka in Feb 1 for injection)   Encounter Date: 01/29/2019  PT End of Session - 01/29/19 1019    Visit Number  6    Date for PT Re-Evaluation  02/26/19    Authorization Type  Medicare BCBS  Progress note at visit 10    PT Start Time  1015    PT Stop Time  1055    PT Time Calculation (min)  40 min    Activity Tolerance  Patient tolerated treatment well    Behavior During Therapy  [facility] for tasks assessed/performed       Past Medical History:  Diagnosis Date  . Arthritis   . Genital HSV 03/2013   Positive HSV 1 genital PCR  . Heart murmur   . Hyperlipidemia   . Hypertension   . Mitral valve prolapse    since age 57  . Mitral valve prolapse   . Overactive bladder   . Ringing in ears 2010   interfers with clarity of hearing at times  . Sleep apnea    pt does not use CPAP  . Stress incontinence in female    wears a pad    Past Surgical History:  Procedure Laterality Date  . BACK SURGERY    . BREAST BIOPSY Left 08/07/2015   benign  . BREAST EXCISIONAL BIOPSY Right 01/27/1996   benign  . BREAST SURGERY     BENIGN RIGHT BR MASS,Bx's X 2  . FOOT SURGERY  2013   CORRECTION OF "HAMMER TOES" -LEFT FOOT  . HAND SURGERY     RIGHT THUMB BONE SPUR  . JOINT REPLACEMENT    . KNEE ARTHROSCOPY  2006, 2010, 2012   LEFT KNEE IN  2010/ RIGHT KNEE IN 2006 AND 2012  . KNEE ARTHROSCOPY  01/24/2012   Procedure: ARTHROSCOPY KNEE;  Surgeon: Johnn Hai, MD;  Location: [facility];  Service: Orthopedics;  Laterality: Left;  left knee arthroscopy with debridment, partial lateral menisectomy  . OOPHORECTOMY     RIGHT OVARY REMOVED  . SPINE SURGERY    . TONSILLECTOMY     . TOTAL KNEE ARTHROPLASTY  2012   Right  . TOTAL KNEE ARTHROPLASTY Left 01/25/2016   Procedure: LEFT TOTAL KNEE ARTHROPLASTY, EVALUATION UNDER ANESTHESIA AND MANIPULATION UNDER ANESTHESIA;  Surgeon: Susa Day, MD;  Location: [facility];  Service: Orthopedics;  Laterality: Left;  . TRIGGER FINGER RELEASE Left 12/25/2017   Procedure: RELEASE TRIGGER FINGER/A-1 PULLEY LEFT MIDDLE FINGER;  Surgeon: Daryll Brod, MD;  Location: [place];  Service: Orthopedics;  Laterality: Left;  . TUBAL LIGATION      There were no vitals filed for this visit.  Subjective Assessment - 01/29/19 1020    Subjective  i did great after last session.    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  No/denies    Multiple Pain Sites  No  [place] Adult PT Treatment/Exercise - 01/29/19 0001      Lumbar Exercises: Aerobic   Nustep  L2 x 10 min   Seat 11, seated moved up one notch     Lumbar Exercises: Standing   Other Standing Lumbar Exercises  Weight shifting on mini tramp toactivate multifius 1 min each way      Lumbar Exercises: Supine   Glut Set  --   adduction/TA/glute cocontraction hold 5 sec 10x: added to HE   Glut Set Limitations  review with good performance    Other Supine Lumbar Exercises  red band horizontal abd with core contraction 2x10      Manual Therapy   Manual Therapy  Soft tissue mobilization    Soft tissue mobilization  Addaday assist Rt hip post & lateral             PT Education - 01/29/19 1052    Education Details  Supine horizontal abduction with red band, scap squuezes    Person(s) Educated  Patient    Methods  Explanation;Demonstration;Tactile cues;Verbal cues;Handout    Comprehension  Returned  demonstration;Verbalized understanding       PT Short Term Goals - 01/29/19 1035      PT SHORT TERM GOAL #2   Title  The patient will report a 25% reduction in pain with walking and standing needed for grocery shopping and work duties    Time  4    Period  Weeks    Status  Achieved   50%       PT Long Term Goals - 01/01/19 1304      PT LONG TERM GOAL #1   Title  The patient will be independent in safe self progression of HEP    Time  8    Period  Weeks    Status  New    Target Date  02/26/19      PT LONG TERM GOAL #2   Title  The patient will report pain level 4/10 or less with walking 30 min for grocery shopping    Time  8    Period  Weeks    Status  New      PT LONG TERM GOAL #3   Title  The patient have lumbar/pelvic/hip strength grossly 4/5 to 4+/5 needed for lifting the cat litter or the heavy vacumn cleaner    Time  8    Period  Weeks    Status  New      PT LONG TERM GOAL #4   Title  FOTO functional outcome score improved to 40%    Time  8    Period  Weeks    Status  New            Plan - 01/29/19 1025    Clinical Impression Statement  I currently feel "great about my hip." Overall improvement is rated at 65%. Pt is compliant with all her HEP but struggles with consistent postural corrections.Pt was able to walk 45 minutes in [employer] this week without any limitations.    Personal Factors and Comorbidities  Age;Past/Current Experience;Comorbidity 1;Comorbidity 2;Comorbidity 3+    Comorbidities  osteoporosis, previous back surgery;  bil knee stiffness/contracture    Examination-Activity Limitations  Caring for Others;Lift;Stand;Stairs;Squat;Sleep;Bend;Locomotion Level    Examination-Participation Restrictions  Church;Meal Prep;Cleaning;Community Activity;Shop;Yard Work    [employer]chant [employer] officer  Evolving/Moderate complexity    Rehab Potential  Good    PT Frequency  2x / week    PT Duration  8 weeks    PT Treatment/Interventions  ADLs/Self Care  Home Management;Aquatic Therapy;Cryotherapy;Electrical Stimulation;Moist Heat;Ultrasound;Functional mobility training;Therapeutic activities;Therapeutic exercise;Neuromuscular re-education;Manual techniques;Patient/family education;Dry needling;Taping    PT Next Visit Plan  Next week measurements    Consulted and Agree with Plan of Care  Patient       Patient will benefit from skilled therapeutic intervention in order to improve the following deficits and impairments:  Decreased range of motion, Difficulty walking, Pain, Decreased activity tolerance, Impaired perceived functional ability, Decreased strength, Postural dysfunction  Visit Diagnosis: Chronic bilateral low back pain with right-sided sciatica  Muscle weakness (generalized)  Difficulty in walking, not elsewhere classified     Problem List Patient Active Problem List   Diagnosis Date Noted  . Essential hypertension 04/12/2018  . Pain in left foot 01/18/2017  . Trigger finger of left hand 01/18/2017  . Left knee DJD 01/25/2016  . Primary osteoarthritis of left knee 01/24/2012  . Decreased libido 11/05/2011  . Osteoarthritis of left knee 11/11/2010  . Impaired glucose tolerance 11/11/2010  . Vitamin D deficiency 11/11/2010  . Obesity 07/14/2010  . Mitral valve prolapse 07/14/2010  . Hyperlipemia 10/01/2007  . OBSTRUCTIVE SLEEP APNEA 10/01/2007    Kerith Sherley, PTA 01/29/2019, 10:57 AM  [facility] [address], [place], [place], [address] Phone: [phone]   Fax:  [phone]  Name: DESTENIE POERTNER MRN: [id number] Date of Birth: 06/25/1945

## 2019-01-29 NOTE — Patient Instructions (Signed)
  PNF Strengthening: Resisted   Standing with resistive band around each hand, bring right arm up and away, thumb back. Repeat _10___ times per set. Do _2___ sets per session. Do _1-2___ sessions per day.      Resisted Horizontal Abduction: Bilateral   Sit or stand, tubing in both hands, arms out in front. Keeping arms straight, pinch shoulder blades together and stretch arms out. Repeat _10___ times per set. Do 2____ sets per session. Do 1___ sessions per day.                  Scapular Retraction: Elbow Flexion (Standing)   With elbows bent to 90, pinch shoulder blades together and rotate arms out, keeping elbows bent. Repeat _10___ times per set. Do _1___ sets per session. Do many____ sessions per day.

## 2019-02-03 ENCOUNTER — Other Ambulatory Visit: Payer: Self-pay

## 2019-02-03 ENCOUNTER — Encounter: Payer: Self-pay | Admitting: Physical Therapy

## 2019-02-03 ENCOUNTER — Ambulatory Visit: Payer: Medicare Other | Admitting: Physical Therapy

## 2019-02-03 DIAGNOSIS — M5441 Lumbago with sciatica, right side: Secondary | ICD-10-CM | POA: Diagnosis not present

## 2019-02-03 DIAGNOSIS — G8929 Other chronic pain: Secondary | ICD-10-CM

## 2019-02-03 DIAGNOSIS — R262 Difficulty in walking, not elsewhere classified: Secondary | ICD-10-CM

## 2019-02-03 DIAGNOSIS — M6281 Muscle weakness (generalized): Secondary | ICD-10-CM

## 2019-02-03 NOTE — Therapy (Signed)
[facility] [address] [place], [place], [address] Phone: [phone]   Fax:  [phone]  Physical Therapy Treatment  Patient Details  Name: [name] MRN: [id number] Date of Birth: November 26, 1945 Referring Provider (PT): Dr. Renold Genta Maryjean Ka in Feb 1 for injection)   Encounter Date: 02/03/2019  PT End of Session - 02/03/19 1535    Visit Number  7    Date for PT Re-Evaluation  02/26/19    Authorization Type  Medicare BCBS  Progress note at visit 10    PT Start Time  1530    PT Stop Time  1610    PT Time Calculation (min)  40 min    Activity Tolerance  Patient tolerated treatment well    Behavior During Therapy  [facility] for tasks assessed/performed       Past Medical History:  Diagnosis Date  . Arthritis   . Genital HSV 03/2013   Positive HSV 1 genital PCR  . Heart murmur   . Hyperlipidemia   . Hypertension   . Mitral valve prolapse    since age 62  . Mitral valve prolapse   . Overactive bladder   . Ringing in ears 2010   interfers with clarity of hearing at times  . Sleep apnea    pt does not use CPAP  . Stress incontinence in female    wears a pad    Past Surgical History:  Procedure Laterality Date  . BACK SURGERY    . BREAST BIOPSY Left 08/07/2015   benign  . BREAST EXCISIONAL BIOPSY Right 01/27/1996   benign  . BREAST SURGERY     BENIGN RIGHT BR MASS,Bx's X 2  . FOOT SURGERY  2013   CORRECTION OF "HAMMER TOES" -LEFT FOOT  . HAND SURGERY     RIGHT THUMB BONE SPUR  . JOINT REPLACEMENT    . KNEE ARTHROSCOPY  2006, 2010, 2012   LEFT KNEE IN  2010/ RIGHT KNEE IN 2006 AND 2012  . KNEE ARTHROSCOPY  01/24/2012   Procedure: ARTHROSCOPY KNEE;  Surgeon: Johnn Hai, MD;  Location: [facility];  Service: Orthopedics;  Laterality: Left;  left knee arthroscopy with debridment, partial lateral menisectomy  . OOPHORECTOMY     RIGHT OVARY REMOVED  . SPINE SURGERY    . TONSILLECTOMY     . TOTAL KNEE ARTHROPLASTY  2012   Right  . TOTAL KNEE ARTHROPLASTY Left 01/25/2016   Procedure: LEFT TOTAL KNEE ARTHROPLASTY, EVALUATION UNDER ANESTHESIA AND MANIPULATION UNDER ANESTHESIA;  Surgeon: Susa Day, MD;  Location[facility];  Service: Orthopedics;  Laterality: Left;  . TRIGGER FINGER RELEASE Left 12/25/2017   Procedure: RELEASE TRIGGER FINGER/A-1 PULLEY LEFT MIDDLE FINGER;  Surgeon: Daryll Brod, MD;  Location: [place];  Service: Orthopedics;  Laterality: Left;  . TUBAL LIGATION      There were no vitals filed for this visit.  Subjective Assessment - 02/03/19 1532    Subjective  Today I have central low back pain, just mild. My hip did bother me walking arounf [employer] on Monday but it still wasn't as bad as it was.    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  Yes    Pain Location  Back    Pain Orientation  Lower    Pain Descriptors / Indicators  Sore    Aggravating Factors   not sure today? Maybe I have been doing too much lately and I need a rest.    Pain Relieving Factors  Sitting, heating pad.    Multiple Pain Sites  No                       [facility] Adult PT Treatment/Exercise - 02/03/19 0001      Lumbar Exercises: Stretches   Standing Side Bend  --   Seated side stretch to the LT 5x 3 sec hold pt sitting black   Piriformis Stretch  Right;2 reps;20 seconds      Lumbar Exercises: Aerobic   Nustep  L2 x 10 min   Seat 11, PTA discussed status with pt.      Lumbar Exercises: Standing   Other Standing Lumbar Exercises  Weight shifting on mini  tramp toactivate multifius 1 min each way   VC to engage lower abs more which felt better to hr back     Lumbar Exercises: Seated   Other Seated Lumbar Exercises  3# wt hip to shoulder 10x each side sitting on black pad. VC for posture       Lumbar Exercises: Supine   Clam  10 reps    Clam Limitations  green band , VC to contract lower abs more/slight PPT    Other Supine Lumbar Exercises  red band horizontal abd with core contraction 2x10   Good return demo     Manual Therapy   Manual Therapy  Soft tissue mobilization    Soft tissue mobilization  Addaday assist Rt hip post & lateral               PT Short Term Goals - 01/29/19 1035      PT SHORT TERM GOAL #2   Title  The patient will report a 25% reduction in pain with walking and standing needed for grocery shopping and work duties    Time  4    Period  Weeks    Status  Achieved   50%       PT Long Term Goals - 01/01/19 1304      PT LONG TERM GOAL #1   Title  The patient will be independent in safe self progression of HEP    Time  8    Period  Weeks    Status  New    Target Date  02/26/19      PT LONG TERM GOAL #2   Title  The patient will report pain level 4/10 or less with walking 30 min for grocery shopping    Time  8    Period  Weeks    Status  New      PT LONG TERM GOAL #3   Title  The patient have lumbar/pelvic/hip strength grossly 4/5 to 4+/5 needed for lifting the cat litter or the heavy vacumn cleaner    Time  8    Period  Weeks    Status  New      PT LONG TERM GOAL #4   Title  FOTO functional outcome score improved to 40%    Time  8  Period  Weeks    Status  New            Plan - 02/03/19 1536    Clinical Impression Statement  Pt arrives with mild central low back pain/ache today. She did not have any thoughts as to why except she has been very active lately at the carwashes and maybe she just needs a rest?? Pt was able to complete a variety of sitting/standing/and supine exercises  all painfree.    Personal Factors and Comorbidities  Age;Past/Current Experience;Comorbidity 1;Comorbidity 2;Comorbidity 3+    Comorbidities  osteoporosis, previous back surgery;  bil knee stiffness/contracture    Examination-Activity Limitations  Caring for Others;Lift;Stand;Stairs;Squat;Sleep;Bend;Locomotion Level    Examination-Participation Restrictions  Church;Meal Prep;Cleaning;Community Activity;Shop;Yard Work    [employer] officer  Evolving/Moderate complexity    Rehab Potential  Good    PT Frequency  2x / week    PT Duration  8 weeks    PT Treatment/Interventions  ADLs/Self Care Home Management;Aquatic Therapy;Cryotherapy;Electrical Stimulation;Moist Heat;Ultrasound;Functional mobility training;Therapeutic activities;Therapeutic exercise;Neuromuscular re-education;Manual techniques;Patient/family education;Dry needling;Taping    PT Next Visit Plan  FOTO next, measure back ROM and MMT per long term goals.    PT Home Exercise Plan  Pt has ex from Pt instructions in the chart, not [employer]    Consulted and Agree with Plan of Care  Patient       Patient will benefit from skilled therapeutic intervention in order to improve the following deficits and impairments:  Decreased range of motion, Difficulty walking, Pain, Decreased activity tolerance, Impaired perceived functional ability, Decreased strength, Postural dysfunction  Visit Diagnosis: Chronic bilateral low back pain with right-sided sciatica  Muscle weakness (generalized)  Difficulty in walking, not elsewhere classified     Problem List Patient Active Problem List   Diagnosis Date Noted  . Essential hypertension 04/12/2018  . Pain in left foot 01/18/2017  . Trigger finger of left hand 01/18/2017  . Left knee DJD 01/25/2016  . Primary osteoarthritis of left knee 01/24/2012  . Decreased libido 11/05/2011  . Osteoarthritis of left knee 11/11/2010  . Impaired glucose tolerance 11/11/2010  . Vitamin D  deficiency 11/11/2010  . Obesity 07/14/2010  . Mitral valve prolapse 07/14/2010  . Hyperlipemia 10/01/2007  . OBSTRUCTIVE SLEEP APNEA 10/01/2007    Karmello Abercrombie, PTA 02/03/2019, 4:11 PM  [facility] [address], [place], [place], [address] Phone: [phone]   Fax:  [phone]  Name: [name] MRN: [id number] Date of Birth: 1945/11/29

## 2019-02-05 ENCOUNTER — Ambulatory Visit: Payer: Medicare Other | Admitting: Physical Therapy

## 2019-02-05 ENCOUNTER — Other Ambulatory Visit: Payer: Self-pay

## 2019-02-05 ENCOUNTER — Encounter: Payer: Self-pay | Admitting: Physical Therapy

## 2019-02-05 DIAGNOSIS — R262 Difficulty in walking, not elsewhere classified: Secondary | ICD-10-CM

## 2019-02-05 DIAGNOSIS — G8929 Other chronic pain: Secondary | ICD-10-CM | POA: Diagnosis not present

## 2019-02-05 DIAGNOSIS — M6281 Muscle weakness (generalized): Secondary | ICD-10-CM

## 2019-02-05 DIAGNOSIS — M5441 Lumbago with sciatica, right side: Secondary | ICD-10-CM | POA: Diagnosis not present

## 2019-02-05 NOTE — Therapy (Signed)
[facility] [address], [place], [place], [address] Phone: [phone]   Fax:  [phone]  Physical Therapy Treatment  Patient Details  Name: [name] MRN: [id number] Date of Birth: September 23, 1945 Referring Provider (PT): Dr. Renold Genta Maryjean Ka in Feb 1 for injection)  Progress Note Reporting Period 01/01/2019 to 02/05/2019  See note below for Objective Data and Assessment of Progress/Goals.      Encounter Date: 02/05/2019  PT End of Session - 02/05/19 1214    Visit Number  8    Date for PT Re-Evaluation  02/26/19    Authorization Type  Medicare BCBS  Progress note at visit 18    PT Start Time  1017    PT Stop Time  1101    PT Time Calculation (min)  44 min    Activity Tolerance  Patient tolerated treatment well       Past Medical History:  Diagnosis Date  . Arthritis   . Genital HSV 03/2013   Positive HSV 1 genital PCR  . Heart murmur   . Hyperlipidemia   . Hypertension   . Mitral valve prolapse    since age 53  . Mitral valve prolapse   . Overactive bladder   . Ringing in ears 2010   interfers with clarity of hearing at times  . Sleep apnea    pt does not use CPAP  . Stress incontinence in female    wears a pad    Past Surgical History:  Procedure Laterality Date  . BACK SURGERY    . BREAST BIOPSY Left 08/07/2015   benign  . BREAST EXCISIONAL BIOPSY Right 01/27/1996   benign  . BREAST SURGERY     BENIGN RIGHT BR MASS,Bx's X 2  . FOOT SURGERY  2013   CORRECTION OF "HAMMER TOES" -LEFT FOOT  . HAND SURGERY     RIGHT THUMB BONE SPUR  . JOINT REPLACEMENT    . KNEE ARTHROSCOPY  2006, 2010, 2012   LEFT KNEE IN  2010/ RIGHT KNEE IN 2006 AND 2012  . KNEE ARTHROSCOPY  01/24/2012   Procedure: ARTHROSCOPY KNEE;  Surgeon: Johnn Hai, MD;  Location: [facility];  Service: Orthopedics;  Laterality: Left;  left knee arthroscopy with debridment, partial lateral menisectomy  .  OOPHORECTOMY     RIGHT OVARY REMOVED  . SPINE SURGERY    . TONSILLECTOMY    . TOTAL KNEE ARTHROPLASTY  2012   Right  . TOTAL KNEE ARTHROPLASTY Left 01/25/2016   Procedure: LEFT TOTAL KNEE ARTHROPLASTY, EVALUATION UNDER ANESTHESIA AND MANIPULATION UNDER ANESTHESIA;  Surgeon: Susa Day, MD;  Location: [facility];  Service: Orthopedics;  Laterality: Left;  . TRIGGER FINGER RELEASE Left 12/25/2017   Procedure: RELEASE TRIGGER FINGER/A-1 PULLEY LEFT MIDDLE FINGER;  Surgeon: Daryll Brod, MD;  Location: [place];  Service: Orthopedics;  Laterality: Left;  . TUBAL LIGATION      There were no vitals filed for this visit.  Subjective Assessment - 02/05/19 1021    Subjective  Worked out in my yard yesterday for the first time in a week.  My hip hurts today but my back doesn't hurt.  Overall though she reports less frequent hip pain.    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  Yes    Pain Score  4     Pain Location  Hip    Pain Orientation  Right         [facility] PT Assessment - 02/05/19 0001      Observation/Other Assessments   Focus on Therapeutic Outcomes (FOTO)   37% limitation       AROM   Lumbar Flexion  76    Lumbar Extension  20    Lumbar - Right Side Bend  35    Lumbar - Left Side Bend  30      Strength   Right Hip ABduction  4-/5    Left Hip ABduction  4-/5    Lumbar Flexion  4-/5    Lumbar Extension  4-/5                 Nu-Step L1 18mn LEs only while discussing status and progress.  [facility] PT Treatment/Exercise - 02/05/19 0001      Lumbar Exercises:  Stretches   Other Lumbar Stretch Exercise  right supine crossover stretch 8x       Lumbar Exercises: Seated   Other Seated Lumbar Exercises  4# wt hip to shoulder 10x each side sitting on black pad. VC for posture       Lumbar Exercises: Supine   Clam  20 reps   double and single    Clam Limitations  green band , VC to contract lower abs more/slight PPT    Other Supine Lumbar Exercises  red band horizontal abduction double and single arm 10x each                PT Short Term Goals - 02/05/19 1035      PT SHORT TERM GOAL #1   Title  The patient will demonstrate knowledge of basic self care strategies and initial HEP    Status  Achieved      PT SHORT TERM GOAL #2   Title  The patient will report a 25% reduction in pain with walking and standing needed for grocery shopping and work duties    Status  Achieved      PT [place]#3   Title  The patient will have improved FOTO functional outcome score to 45%    Status  Achieved      PT SHORT TERM GOAL #4   Title  The patient will have improved lumbar extension to 15 degrees and sidebending to 10 degrees needed for gardening/yardwork    Status  Achieved        PT Long Term Goals - 02/05/19 1037      PT LONG TERM GOAL #4   Title  FOTO functional outcome score improved to 40%    Status  Achieved            Plan - 02/05/19 1215    Clinical Impression Statement  The patient has posterior hip/buttock pain today which she attributes to working in her yard yesterday.  She reports significant pain relief with the Nu-Step and following stretching and strengtheing ex's.  She denies back pain at all today and states her hip pain frequency is better overall.  She is still limited in longer distance walking particularly with walking in a big store  like [employer].  She avoids heavy lifting and is selective in her purchases (14# cat litter instead of the bigger container).  Much improved lumbar ROM noted. Trunk/core strength  improved by 1/3 muscle strength grade.  She has met all STGS and is on track to meet LTGs.  Therapist providing verbal cues for core muscle activation and avoidance of compensatory movements with exercise.    Comorbidities  osteoporosis, previous back surgery;  bil knee stiffness/contracture    Rehab Potential  Good    PT Frequency  2x / week    PT Duration  8 weeks    PT Treatment/Interventions  ADLs/Self Care Home Management;Aquatic Therapy;Cryotherapy;Electrical Stimulation;Moist Heat;Ultrasound;Functional mobility training;Therapeutic activities;Therapeutic exercise;Neuromuscular re-education;Manual techniques;Patient/family education;Dry needling;Taping    PT Next Visit Plan  continue lumbo/pelvic/ hip core strengthening;  Pilates based ex;  Stretching; 10th visit prog note done early      Patient will benefit from skilled therapeutic intervention in order to improve the following deficits and impairments:  Decreased range of motion, Difficulty walking, Pain, Decreased activity tolerance, Impaired perceived functional ability, Decreased strength, Postural dysfunction  Visit Diagnosis: Chronic bilateral low back pain with right-sided sciatica  Muscle weakness (generalized)  Difficulty in walking, not elsewhere classified     Problem List Patient Active Problem List   Diagnosis Date Noted  . Essential hypertension 04/12/2018  . Pain in left foot 01/18/2017  . Trigger finger of left hand 01/18/2017  . Left knee DJD 01/25/2016  . Primary osteoarthritis of left knee 01/24/2012  . Decreased libido 11/05/2011  . Osteoarthritis of left knee 11/11/2010  . Impaired glucose tolerance 11/11/2010  . Vitamin D deficiency 11/11/2010  . Obesity 07/14/2010  . Mitral valve prolapse 07/14/2010  . Hyperlipemia 10/01/2007  . OBSTRUCTIVE SLEEP APNEA 10/01/2007   Ruben Im, PT 02/05/19 12:22 PM Phone: [phone] Fax: [phone] Alvera Singh 02/05/2019, 12:21 PM  [facility] [address] [place], [place], [address] Phone: [phone]   Fax:  [phone]  Name: [name] MRN: [id number] Date of Birth: 07-27-1945

## 2019-02-06 NOTE — Patient Instructions (Addendum)
Arrange for physical therapy for chronic low back pain with radiculopathy.  To see Dr. Maryjean Ka in early February.

## 2019-02-08 DIAGNOSIS — Z03818 Encounter for observation for suspected exposure to other biological agents ruled out: Secondary | ICD-10-CM | POA: Diagnosis not present

## 2019-02-09 ENCOUNTER — Ambulatory Visit: Payer: Medicare Other

## 2019-02-10 ENCOUNTER — Other Ambulatory Visit: Payer: Self-pay

## 2019-02-10 ENCOUNTER — Encounter: Payer: Self-pay | Admitting: Physical Therapy

## 2019-02-10 ENCOUNTER — Ambulatory Visit: Payer: Medicare Other | Admitting: Physical Therapy

## 2019-02-10 DIAGNOSIS — G8929 Other chronic pain: Secondary | ICD-10-CM | POA: Diagnosis not present

## 2019-02-10 DIAGNOSIS — M6281 Muscle weakness (generalized): Secondary | ICD-10-CM | POA: Diagnosis not present

## 2019-02-10 DIAGNOSIS — R262 Difficulty in walking, not elsewhere classified: Secondary | ICD-10-CM

## 2019-02-10 DIAGNOSIS — M5441 Lumbago with sciatica, right side: Secondary | ICD-10-CM | POA: Diagnosis not present

## 2019-02-10 NOTE — Therapy (Signed)
[facility] [address] [place], [place], [address] Phone: [phone]   Fax:  [phone]  Physical Therapy Treatment  Patient Details  Name: [name] MRN: [id number] Date of Birth: 04-11-1945 Referring Provider (PT): Dr. Renold Genta Maryjean Ka in Feb 1 for injection)   Encounter Date: 02/10/2019  PT End of Session - 02/10/19 1541    Visit Number  9    Date for PT Re-Evaluation  02/26/19    Authorization Type  Medicare BCBS  Progress note at visit 18    PT Start Time  1527    PT Stop Time  1606    PT Time Calculation (min)  39 min    Activity Tolerance  Patient tolerated treatment well    Behavior During Therapy  [facility] for tasks assessed/performed       Past Medical History:  Diagnosis Date  . Arthritis   . Genital HSV 03/2013   Positive HSV 1 genital PCR  . Heart murmur   . Hyperlipidemia   . Hypertension   . Mitral valve prolapse    since age 66  . Mitral valve prolapse   . Overactive bladder   . Ringing in ears 2010   interfers with clarity of hearing at times  . Sleep apnea    pt does not use CPAP  . Stress incontinence in female    wears a pad    Past Surgical History:  Procedure Laterality Date  . BACK SURGERY    . BREAST BIOPSY Left 08/07/2015   benign  . BREAST EXCISIONAL BIOPSY Right 01/27/1996   benign  . BREAST SURGERY     BENIGN RIGHT BR MASS,Bx's X 2  . FOOT SURGERY  2013   CORRECTION OF "HAMMER TOES" -LEFT FOOT  . HAND SURGERY     RIGHT THUMB BONE SPUR  . JOINT REPLACEMENT    . KNEE ARTHROSCOPY  2006, 2010, 2012   LEFT KNEE IN  2010/ RIGHT KNEE IN 2006 AND 2012  . KNEE ARTHROSCOPY  01/24/2012   Procedure: ARTHROSCOPY KNEE;  Surgeon: Johnn Hai, MD;  Location: [facility];  Service: Orthopedics;  Laterality: Left;  left knee arthroscopy with debridment, partial lateral menisectomy  . OOPHORECTOMY     RIGHT OVARY REMOVED  . SPINE SURGERY    . TONSILLECTOMY     . TOTAL KNEE ARTHROPLASTY  2012   Right  . TOTAL KNEE ARTHROPLASTY Left 01/25/2016   Procedure: LEFT TOTAL KNEE ARTHROPLASTY, EVALUATION UNDER ANESTHESIA AND MANIPULATION UNDER ANESTHESIA;  Surgeon: Susa Day, MD;  Location: [facility];  Service: Orthopedics;  Laterality: Left;  . TRIGGER FINGER RELEASE Left 12/25/2017   Procedure: RELEASE TRIGGER FINGER/A-1 PULLEY LEFT MIDDLE FINGER;  Surgeon: Daryll Brod, MD;  Location: [place];  Service: Orthopedics;  Laterality: Left;  . TUBAL LIGATION      There were no vitals filed for this visit.  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  No/denies    Multiple Pain Sites  No                       [facility] Adult PT Treatment/Exercise - 02/10/19 0001      Lumbar Exercises: Stretches   Lower Trunk Rotation  --   Rock 10x side to side then static holds 10 sec 3x bil   Standing Side Bend  --   seated side bend stretch to thre LT 5x      Lumbar Exercises: Aerobic   Nustep  L3 x 10 min   Seat 11, PTA discussed status with pt.      Lumbar Exercises: Standing   Other Standing Lumbar Exercises  Weight shifting on mini tramp toactivate multifius 1 min each way   VC to engage lower abs more which felt better to hr back   Other Standing Lumbar Exercises  Step up RTLE  on first step 10x 2      Lumbar Exercises: Seated   Other Seated Lumbar Exercises  4# wt hip to shoulder 15x each side  sitting on black pad. VC for posture       Lumbar Exercises: Supine   Clam  20 reps   double and single    Clam Limitations  green band , VC to contract lower abs more/slight PPT               PT Short Term Goals - 02/05/19 1035      PT SHORT TERM GOAL #1   Title  The patient will demonstrate knowledge of basic self care strategies and initial HEP    Status  Achieved      PT SHORT TERM GOAL #2   Title  The patient will report a 25% reduction in pain with walking and standing needed for grocery shopping and work duties    Status  Achieved      PT [place] #3   Title  The patient will have improved FOTO functional outcome score to 45%    Status  Achieved      PT SHORT TERM GOAL #4   Title  The patient will have improved lumbar extension to 15 degrees and sidebending to 10 degrees needed for gardening/yardwork    Status  Achieved        PT Long Term Goals - 02/05/19 1037      PT LONG TERM GOAL #4   Title  FOTO functional outcome score improved to 40%    Status  Achieved            Plan - 02/10/19 1541    Clinical Impression Statement  Pt doing well today, no pain. Pt participated in supine, sitting , and standing exercises that continue to focus on Rt hip flexibility, Rt knee, and core strength. Nothing added to HEP as pt is currently satisfied with her current load.    Personal Factors and Comorbidities  Age;Past/Current Experience;Comorbidity 1;Comorbidity 2;Comorbidity 3+    Comorbidities  osteoporosis, previous back surgery;  bil knee stiffness/contracture    Examination-Activity Limitations  Caring for Others;Lift;Stand;Stairs;Squat;Sleep;Bend;Locomotion Level    Examination-Participation Restrictions  Church;Meal Prep;Cleaning;Community Activity;Shop;Yard Work    [employer] officer  Evolving/Moderate complexity    Rehab Potential  Good    PT Frequency  2x / week    PT Duration  8 weeks    PT Treatment/Interventions  ADLs/Self Care  Home Management;Aquatic Therapy;Cryotherapy;Dealer  Stimulation;Moist Heat;Ultrasound;Functional mobility training;Therapeutic activities;Therapeutic exercise;Neuromuscular re-education;Manual techniques;Patient/family education;Dry needling;Taping    PT Next Visit Plan  continue lumbo/pelvic/ hip core strengthening;  Pilates based ex;  stretching    PT Home Exercise Plan  Pt has ex from Pt instructions in the chart, not Med[employer]       Patient will benefit from skilled therapeutic intervention in order to improve the following deficits and impairments:  Decreased range of motion, Difficulty walking, Pain, Decreased activity tolerance, Impaired perceived functional ability, Decreased strength, Postural dysfunction  Visit Diagnosis: Chronic bilateral low back pain with right-sided sciatica  Muscle weakness (generalized)  Difficulty in walking, not elsewhere classified     Problem List Patient Active Problem List   Diagnosis Date Noted  . Essential hypertension 04/12/2018  . Pain in left foot 01/18/2017  . Trigger finger of left hand 01/18/2017  . Left knee DJD 01/25/2016  . Primary osteoarthritis of left knee 01/24/2012  . Decreased libido 11/05/2011  . Osteoarthritis of left knee 11/11/2010  . Impaired glucose tolerance 11/11/2010  . Vitamin D deficiency 11/11/2010  . Obesity 07/14/2010  . Mitral valve prolapse 07/14/2010  . Hyperlipemia 10/01/2007  . OBSTRUCTIVE SLEEP APNEA 10/01/2007    [name], PTA 02/10/2019, 4:10 PM   [facility] [address] [place], [place], [address] Phone: [phone]   Fax:  [phone]  Name: [name] MRN: [id number] Date of Birth: March 08, 1945

## 2019-02-12 ENCOUNTER — Other Ambulatory Visit: Payer: Self-pay

## 2019-02-12 ENCOUNTER — Encounter: Payer: Self-pay | Admitting: Physical Therapy

## 2019-02-12 ENCOUNTER — Ambulatory Visit: Payer: Medicare Other | Admitting: Physical Therapy

## 2019-02-12 DIAGNOSIS — R262 Difficulty in walking, not elsewhere classified: Secondary | ICD-10-CM | POA: Diagnosis not present

## 2019-02-12 DIAGNOSIS — G8929 Other chronic pain: Secondary | ICD-10-CM | POA: Diagnosis not present

## 2019-02-12 DIAGNOSIS — M6281 Muscle weakness (generalized): Secondary | ICD-10-CM

## 2019-02-12 DIAGNOSIS — M5441 Lumbago with sciatica, right side: Secondary | ICD-10-CM | POA: Diagnosis not present

## 2019-02-12 NOTE — Therapy (Signed)
[facility] [address], [place], [place], [address] Phone: [phone]   Fax:  [phone]  Physical Therapy Treatment  Patient Details  Name: [name] MRN: [id number] Date of Birth: Aug 17, 1945 Referring Provider (PT): Dr. Renold Genta Maryjean Ka in Feb 1 for injection)   Encounter Date: 02/12/2019  PT End of Session - 02/12/19 1022    Visit Number  10    Date for PT Re-Evaluation  02/26/19    Authorization Type  Medicare BCBS  Progress note at visit 18    PT Start Time  1010    PT Stop Time  1102    PT Time Calculation (min)  52 min    Activity Tolerance  Patient tolerated treatment well    Behavior During Therapy  [facility] for tasks assessed/performed       Past Medical History:  Diagnosis Date  . Arthritis   . Genital HSV 03/2013   Positive HSV 1 genital PCR  . Heart murmur   . Hyperlipidemia   . Hypertension   . Mitral valve prolapse    since age 50  . Mitral valve prolapse   . Overactive bladder   . Ringing in ears 2010   interfers with clarity of hearing at times  . Sleep apnea    pt does not use CPAP  . Stress incontinence in female    wears a pad    Past Surgical History:  Procedure Laterality Date  . BACK SURGERY    . BREAST BIOPSY Left 08/07/2015   benign  . BREAST EXCISIONAL BIOPSY Right 01/27/1996   benign  . BREAST SURGERY     BENIGN RIGHT BR MASS,Bx's X 2  . FOOT SURGERY  2013   CORRECTION OF "HAMMER TOES" -LEFT FOOT  . HAND SURGERY     RIGHT THUMB BONE SPUR  . JOINT REPLACEMENT    . KNEE ARTHROSCOPY  2006, 2010, 2012   LEFT KNEE IN  2010/ RIGHT KNEE IN 2006 AND 2012  . KNEE ARTHROSCOPY  01/24/2012   Procedure: ARTHROSCOPY KNEE;  Surgeon: Johnn Hai, MD;  Location: [facility];  Service: Orthopedics;  Laterality: Left;  left knee arthroscopy with debridment, partial lateral menisectomy  . OOPHORECTOMY     RIGHT OVARY REMOVED  . SPINE SURGERY    .  TONSILLECTOMY    . TOTAL KNEE ARTHROPLASTY  2012   Right  . TOTAL KNEE ARTHROPLASTY Left 01/25/2016   Procedure: LEFT TOTAL KNEE ARTHROPLASTY, EVALUATION UNDER ANESTHESIA AND MANIPULATION UNDER ANESTHESIA;  Surgeon: Susa Day, MD;  Location: [facility];  Service: Orthopedics;  Laterality: Left;  . TRIGGER FINGER RELEASE Left 12/25/2017   Procedure: RELEASE TRIGGER FINGER/A-1 PULLEY LEFT MIDDLE FINGER;  Surgeon: Daryll Brod, MD;  Location: [place];  Service: Orthopedics;  Laterality: Left;  . TUBAL LIGATION      There were no vitals filed for this visit.  Subjective Assessment - 02/12/19 1025    Subjective  My rigtht knee is very stiff.    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  No/denies                       [facility] Adult PT Treatment/Exercise -  02/12/19 0001      Lumbar Exercises: Stretches   Lower Trunk Rotation  --   Rock 10x side to side then static holds 10 sec 3x bil   Standing Side Bend  --   seated with cane; 10x to the LT VC for longer trunk   Other Lumbar Stretch Exercise  Knee flexion stretches on second step 20x, instructed pt why this exercise will help her Rt hip      Lumbar Exercises: Aerobic   Nustep  L3 x 10 min   Seat 11, PTA discussed status with pt.      Lumbar Exercises: Standing   Other Standing Lumbar Exercises  Weight shifting on mini tramp toactivate multifius 1 min each way   VC to engage lower abs more which felt better to hr back   Other Standing Lumbar Exercises  Step up RTLE  on first step 10x 2   VC/demo to reduce QL initiation     Manual Therapy   Soft tissue mobilization  Addaday assist to Rt gluteal group , hamstring, lateral quad and proximal hip ( RF, PM)   pt sidelying               PT Short Term Goals - 02/05/19 1035      PT SHORT TERM GOAL #1   Title  The patient will demonstrate knowledge of basic self care strategies and initial HEP    Status  Achieved      PT SHORT TERM GOAL #2   Title  The patient will report a 25% reduction in pain with walking and standing needed for grocery shopping and work duties    Status  Achieved      PT [place] #3   Title  The patient will have improved FOTO functional outcome score to 45%    Status  Achieved      PT SHORT TERM GOAL #4   Title  The patient will have improved lumbar extension to 15 degrees and sidebending to 10 degrees needed for gardening/yardwork    Status  Achieved        PT Long Term Goals - 02/05/19 1037      PT LONG TERM GOAL #4   Title  FOTO functional outcome score improved to 40%    Status  Achieved            Plan - 02/12/19 1108    Clinical Impression Statement  Pt arrives with no pain but RT knee stiffness. Pt is more aware now how she compensates with her RT hip when her Rt knee is stiff. She still struggles with steps: hip hiking and circumducting the Rt hip and pelvis. Pt is now able to recognize this more and will try working on this at home.    Personal Factors and Comorbidities  Age;Past/Current Experience;Comorbidity 1;Comorbidity 2;Comorbidity 3+    Comorbidities  osteoporosis, previous back surgery;  bil knee stiffness/contracture    Examination-Activity Limitations  Caring for Others;Lift;Stand;Stairs;Squat;Sleep;Bend;Locomotion Level    Examination-Participation Restrictions  Church;Meal Prep;Cleaning;Community Activity;Shop;Yard Work    [employer]chant [employer] officer  Evolving/Moderate complexity    Rehab Potential  Good    PT Frequency  2x / week    PT Duration  8 weeks    PT Treatment/Interventions  ADLs/Self Care Home Management;Aquatic Therapy;Cryotherapy;Electrical Stimulation;Moist Heat;Ultrasound;Functional mobility training;Therapeutic  activities;Therapeutic exercise;Neuromuscular re-education;Manual techniques;Patient/family education;Dry needling;Taping    PT Next Visit Plan  continue lumbo/pelvic/ hip core strengthening;  Pilates based ex;  stretching  PT Home Exercise Plan  Pt has ex from Pt instructions in the chart, not [employer]    Consulted and Agree with Plan of Care  Patient       Patient will benefit from skilled therapeutic intervention in order to improve the following deficits and impairments:  Decreased range of motion, Difficulty walking, Pain, Decreased activity tolerance, Impaired perceived functional ability, Decreased strength, Postural dysfunction  Visit Diagnosis: Chronic bilateral low back pain with right-sided sciatica  Muscle weakness (generalized)  Difficulty in walking, not elsewhere classified     Problem List Patient Active Problem List   Diagnosis Date Noted  . Essential hypertension 04/12/2018  . Pain in left foot 01/18/2017  . Trigger finger of left hand 01/18/2017  . Left knee DJD 01/25/2016  . Primary osteoarthritis of left knee 01/24/2012  . Decreased libido 11/05/2011  . Osteoarthritis of left knee 11/11/2010  . Impaired glucose tolerance 11/11/2010  . Vitamin D deficiency 11/11/2010  . Obesity 07/14/2010  . Mitral valve prolapse 07/14/2010  . Hyperlipemia 10/01/2007  . OBSTRUCTIVE SLEEP APNEA 10/01/2007    Damonie Ellenwood, PTA 02/12/2019, 11:13 AM  [facility] [address] [place], [place], [address] Phone: [phone]   Fax:  [phone]  Name: [name] MRN: [id number] Date of Birth: 1945-09-13

## 2019-02-15 DIAGNOSIS — M5416 Radiculopathy, lumbar region: Secondary | ICD-10-CM | POA: Diagnosis not present

## 2019-02-17 ENCOUNTER — Other Ambulatory Visit: Payer: Self-pay

## 2019-02-17 ENCOUNTER — Ambulatory Visit: Payer: Medicare Other | Attending: Internal Medicine | Admitting: Physical Therapy

## 2019-02-17 ENCOUNTER — Encounter: Payer: Self-pay | Admitting: Physical Therapy

## 2019-02-17 DIAGNOSIS — M6281 Muscle weakness (generalized): Secondary | ICD-10-CM | POA: Insufficient documentation

## 2019-02-17 DIAGNOSIS — M5441 Lumbago with sciatica, right side: Secondary | ICD-10-CM | POA: Insufficient documentation

## 2019-02-17 DIAGNOSIS — G8929 Other chronic pain: Secondary | ICD-10-CM | POA: Diagnosis not present

## 2019-02-17 DIAGNOSIS — R262 Difficulty in walking, not elsewhere classified: Secondary | ICD-10-CM | POA: Diagnosis not present

## 2019-02-17 NOTE — Therapy (Signed)
[facility] [address] [place], [place], [address] Phone: [phone]   Fax:  [phone]  Physical Therapy Treatment  Patient Details  Name: [name] MRN: [id number] Date of Birth: 10-09-1945 Referring Provider (PT): Dr. Renold Genta Maryjean Ka in Feb 1 for injection)   Encounter Date: 02/17/2019  PT End of Session - 02/17/19 1535    Visit Number  11    Date for PT Re-Evaluation  02/26/19    Authorization Type  Medicare BCBS  Progress note at visit 18    PT Start Time  1530    PT Stop Time  1610    PT Time Calculation (min)  40 min    Activity Tolerance  Patient tolerated treatment well    Behavior During Therapy  [facility] for tasks assessed/performed       Past Medical History:  Diagnosis Date  . Arthritis   . Genital HSV 03/2013   Positive HSV 1 genital PCR  . Heart murmur   . Hyperlipidemia   . Hypertension   . Mitral valve prolapse    since age 29  . Mitral valve prolapse   . Overactive bladder   . Ringing in ears 2010   interfers with clarity of hearing at times  . Sleep apnea    pt does not use CPAP  . Stress incontinence in female    wears a pad    Past Surgical History:  Procedure Laterality Date  . BACK SURGERY    . BREAST BIOPSY Left 08/07/2015   benign  . BREAST EXCISIONAL BIOPSY Right 01/27/1996   benign  . BREAST SURGERY     BENIGN RIGHT BR MASS,Bx's X 2  . FOOT SURGERY  2013   CORRECTION OF "HAMMER TOES" -LEFT FOOT  . HAND SURGERY     RIGHT THUMB BONE SPUR  . JOINT REPLACEMENT    . KNEE ARTHROSCOPY  2006, 2010, 2012   LEFT KNEE IN  2010/ RIGHT KNEE IN 2006 AND 2012  . KNEE ARTHROSCOPY  01/24/2012   Procedure: ARTHROSCOPY KNEE;  Surgeon: Johnn Hai, MD;  Location: [facility];  Service: Orthopedics;  Laterality: Left;  left knee arthroscopy with debridment, partial lateral menisectomy  . OOPHORECTOMY     RIGHT OVARY REMOVED  . SPINE SURGERY    . TONSILLECTOMY     . TOTAL KNEE ARTHROPLASTY  2012   Right  . TOTAL KNEE ARTHROPLASTY Left 01/25/2016   Procedure: LEFT TOTAL KNEE ARTHROPLASTY, EVALUATION UNDER ANESTHESIA AND MANIPULATION UNDER ANESTHESIA;  Surgeon: Susa Day, MD;  Location: [facility];  Service: Orthopedics;  Laterality: Left;  . TRIGGER FINGER RELEASE Left 12/25/2017   Procedure: RELEASE TRIGGER FINGER/A-1 PULLEY LEFT MIDDLE FINGER;  Surgeon: Daryll Brod, MD;  Location: [place];  Service: Orthopedics;  Laterality: Left;  . TUBAL LIGATION      There were no vitals filed for this visit.  Subjective Assessment - 02/17/19 1537    Subjective  I am a little down today. I had a shot in my back Monday and it hasn't done any good. My back has bothered me all morning.    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  Yes   Back 3-4, denies hip pain.                      [place] Adult PT Treatment/Exercise - 02/17/19 0001      Lumbar Exercises: Stretches   Pelvic Tilt  10 reps    Pelvic Tilt Limitations  VC to "let the PPT go slowly."       Lumbar Exercises: Aerobic   Nustep  L3 x 10 min   Seat 11, PTA discussed status with pt.      Lumbar Exercises: Standing   Row  Strengthening;Both;10 reps;Theraband    Theraband Level (Row)  Level 2 (Red)    Shoulder Extension  AROM;Strengthening;Both;10 reps;Theraband    Theraband Level (Shoulder Extension)  Level 1 (Yellow)    Other Standing Lumbar Exercises  Weight shifting on mini tramp toactivate multifius 1 min each way   VC to engage lower abs more which felt better to hr back      Lumbar Exercises: Seated   Other Seated Lumbar Exercises  4# wt hip to shoulder 15x each side sitting on black pad. VC for posture       Lumbar Exercises: Supine   Bridge  --   Ball/glute cocontraction 5 sec hold 10x      Manual Therapy   Soft tissue mobilization  Addaday assist to Rt gluteal group , hamstring, lateral quad and proximal hip ( RF, PM)   pt sidelying              PT Short Term Goals - 02/05/19 1035      PT SHORT TERM GOAL #1   Title  The patient will demonstrate knowledge of basic self care strategies and initial HEP    Status  Achieved      PT SHORT TERM GOAL #2   Title  The patient will report a 25% reduction in pain with walking and standing needed for grocery shopping and work duties    Status  Achieved      PT [place] #3   Title  The patient will have improved FOTO functional outcome score to 45%    Status  Achieved      PT SHORT TERM GOAL #4   Title  The patient will have improved lumbar extension to 15 degrees and sidebending to 10 degrees needed for gardening/yardwork    Status  Achieved        PT Long Term Goals - 02/05/19 1037      PT LONG TERM GOAL #4   Title  FOTO functional outcome score improved to 40%    Status  Achieved            Plan - 02/17/19 1535    Clinical Impression Statement  Pt arrives today a little blue as she was hoping a shot she had on Monday would help abolish her back pain. She reports a 3-4 LBP today and denies any hip pian. pt was able to perform standing postural exercises painfree. Supine core stabilization required extra verbal cuing to use her muscles more on the eccentric component of the exercise. Once pt did this her pain abolished.    Personal Factors and Comorbidities  Age;Past/Current Experience;Comorbidity 1;Comorbidity 2;Comorbidity 3+    Comorbidities  osteoporosis, previous back surgery;  bil knee stiffness/contracture    Examination-Activity Limitations  Caring for  Others;Lift;Stand;Stairs;Squat;Sleep;Bend;Locomotion Level    Examination-Participation Restrictions  Church;Meal Prep;Cleaning;Community Activity;Shop;Yard Work    [employer] officer  Evolving/Moderate complexity    Rehab Potential  Good    PT Frequency  2x / week    PT Duration  8 weeks    PT Treatment/Interventions  ADLs/Self Care Home Management;Aquatic Therapy;Cryotherapy;Electrical Stimulation;Moist Heat;Ultrasound;Functional mobility training;Therapeutic activities;Therapeutic exercise;Neuromuscular re-education;Manual techniques;Patient/family education;Dry needling;Taping    PT Next Visit Plan  continue lumbo/pelvic/ hip core strengthening;  Pilates based ex;  stretching, give standing bands for HEP    PT Home Exercise Plan  Pt has ex from Pt instructions in the chart, not [employer]    Consulted and Agree with Plan of Care  Patient       Patient will benefit from skilled therapeutic intervention in order to improve the following deficits and impairments:  Decreased range of motion, Difficulty walking, Pain, Decreased activity tolerance, Impaired perceived functional ability, Decreased strength, Postural dysfunction  Visit Diagnosis: Chronic bilateral low back pain with right-sided sciatica  Muscle weakness (generalized)  Difficulty in walking, not elsewhere classified     Problem List Patient Active Problem List   Diagnosis Date Noted  . Essential hypertension 04/12/2018  . Pain in left foot 01/18/2017  . Trigger finger of left hand 01/18/2017  . Left knee DJD 01/25/2016  . Primary osteoarthritis of left knee 01/24/2012  . Decreased libido 11/05/2011  . Osteoarthritis of left knee 11/11/2010  . Impaired glucose tolerance 11/11/2010  . Vitamin D deficiency 11/11/2010  . Obesity 07/14/2010  . Mitral valve prolapse 07/14/2010  . Hyperlipemia 10/01/2007  . OBSTRUCTIVE SLEEP APNEA 10/01/2007    Shimon Trowbridge, PTA 02/17/2019, 4:13 PM  [facility] [address], [place], [place], [address] Phone: [phone]   Fax:  [phone]  Name: [name] MRN: [id number] Date of Birth: 12-30-1945

## 2019-02-18 ENCOUNTER — Ambulatory Visit: Payer: Medicare Other | Attending: Internal Medicine

## 2019-02-18 DIAGNOSIS — Z23 Encounter for immunization: Secondary | ICD-10-CM

## 2019-02-18 NOTE — Progress Notes (Signed)
   Covid-19 Vaccination Clinic  Name:  [name]    MRN: [id number] DOB: 14-Sep-1945  02/18/2019  [name] was observed post Covid-19 immunization for 15 minutes without incidence. She was provided with Vaccine Information Sheet and instruction to access the V-Safe system.   [name] was instructed to call 911 with any severe reactions post vaccine: Marland Kitchen Difficulty breathing  . Swelling of your face and throat  . A fast heartbeat  . A bad rash all over your body  . Dizziness and weakness    Immunizations Administered    Name Date Dose VIS Date Route   Pfizer COVID-19 Vaccine 02/18/2019  4:24 PM 0.3 mL 12/25/2018 Intramuscular   Manufacturer: [place]   Lot: [id number]   [place]: [id number]

## 2019-02-19 ENCOUNTER — Encounter: Payer: Self-pay | Admitting: Physical Therapy

## 2019-02-19 ENCOUNTER — Ambulatory Visit: Payer: Medicare Other | Admitting: Physical Therapy

## 2019-02-19 ENCOUNTER — Other Ambulatory Visit: Payer: Self-pay

## 2019-02-19 DIAGNOSIS — G8929 Other chronic pain: Secondary | ICD-10-CM | POA: Diagnosis not present

## 2019-02-19 DIAGNOSIS — M5441 Lumbago with sciatica, right side: Secondary | ICD-10-CM | POA: Diagnosis not present

## 2019-02-19 DIAGNOSIS — M6281 Muscle weakness (generalized): Secondary | ICD-10-CM

## 2019-02-19 DIAGNOSIS — R262 Difficulty in walking, not elsewhere classified: Secondary | ICD-10-CM

## 2019-02-19 NOTE — Therapy (Signed)
[facility] [address] [place], [place], [address] Phone: [phone]   Fax:  [phone]  Physical Therapy Treatment  Patient Details  Name: [name] MRN: [id number] Date of Birth: 1945-05-29 Referring Provider (PT): Dr. Renold Genta Maryjean Ka in Feb 1 for injection)   Encounter Date: 02/19/2019  PT End of Session - 02/19/19 1020    Visit Number  12    Date for PT Re-Evaluation  02/26/19    Authorization Type  Medicare BCBS  Progress note at visit 18    PT Start Time  1018    PT Stop Time  1118    PT Time Calculation (min)  60 min    Activity Tolerance  Patient tolerated treatment well    Behavior During Therapy  [facility] for tasks assessed/performed       Past Medical History:  Diagnosis Date  . Arthritis   . Genital HSV 03/2013   Positive HSV 1 genital PCR  . Heart murmur   . Hyperlipidemia   . Hypertension   . Mitral valve prolapse    since age 55  . Mitral valve prolapse   . Overactive bladder   . Ringing in ears 2010   interfers with clarity of hearing at times  . Sleep apnea    pt does not use CPAP  . Stress incontinence in female    wears a pad    Past Surgical History:  Procedure Laterality Date  . BACK SURGERY    . BREAST BIOPSY Left 08/07/2015   benign  . BREAST EXCISIONAL BIOPSY Right 01/27/1996   benign  . BREAST SURGERY     BENIGN RIGHT BR MASS,Bx's X 2  . FOOT SURGERY  2013   CORRECTION OF "HAMMER TOES" -LEFT FOOT  . HAND SURGERY     RIGHT THUMB BONE SPUR  . JOINT REPLACEMENT    . KNEE ARTHROSCOPY  2006, 2010, 2012   LEFT KNEE IN  2010/ RIGHT KNEE IN 2006 AND 2012  . KNEE ARTHROSCOPY  01/24/2012   Procedure: ARTHROSCOPY KNEE;  Surgeon: Johnn Hai, MD;  Location: [facility];  Service: Orthopedics;  Laterality: Left;  left knee arthroscopy with debridment, partial lateral menisectomy  . OOPHORECTOMY     RIGHT OVARY REMOVED  . SPINE SURGERY    . TONSILLECTOMY     . TOTAL KNEE ARTHROPLASTY  2012   Right  . TOTAL KNEE ARTHROPLASTY Left 01/25/2016   Procedure: LEFT TOTAL KNEE ARTHROPLASTY, EVALUATION UNDER ANESTHESIA AND MANIPULATION UNDER ANESTHESIA;  Surgeon: Susa Day, MD;  Location: [facility];  Service: Orthopedics;  Laterality: Left;  . TRIGGER FINGER RELEASE Left 12/25/2017   Procedure: RELEASE TRIGGER FINGER/A-1 PULLEY LEFT MIDDLE FINGER;  Surgeon: Daryll Brod, MD;  Location: [place];  Service: Orthopedics;  Laterality: Left;  . TUBAL LIGATION      There were no vitals filed for this visit.  Subjective Assessment - 02/19/19 1022    Subjective  [place] so all that walking and standing aggrevated my hip some.    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  Yes   RT knee and LT Low back hurt: 4/10 knee, 4/10 back sore  [place] Adult PT Treatment/Exercise - 02/19/19 0001      Lumbar Exercises: Aerobic   Nustep  L3 x 10 min   Seat 11, PTA discussed status with pt.      Lumbar Exercises: Standing   Row  Strengthening;Both;20 reps;Theraband    Theraband Level (Row)  Level 2 (Red)   Added to HEP   Shoulder Extension  AROM;Strengthening;Both;10 reps;Theraband    Theraband Level (Shoulder Extension)  Level 2 (Red)    Shoulder Extension Limitations  Added to HEP: issued band    Other Standing Lumbar Exercises  Weight shifting on mini tramp toactivate multifius 1 min each way   VC to engage lower abs more which felt better to hr back     Moist Heat Therapy   Number Minutes Moist Heat  15 Minutes    Moist Heat Location  Lumbar Spine   post session            PT Education - 02/19/19 1055    Education Details  Standing 90 degree  angle stretch added to HEP at th [employer]) Educated  Patient    Methods  Explanation;Demonstration;Verbal cues    Comprehension  Verbalized understanding;Returned demonstration       PT Short Term Goals - 02/05/19 1035      PT SHORT TERM GOAL #1   Title  The patient will demonstrate knowledge of basic self care strategies and initial HEP    Status  Achieved      PT SHORT TERM GOAL #2   Title  The patient will report a 25% reduction in pain with walking and standing needed for grocery shopping and work duties    Status  Achieved      PT [place] #3   Title  The patient will have improved FOTO functional outcome score to 45%    Status  Achieved      PT SHORT TERM GOAL #4   Title  The patient will have improved lumbar extension to 15 degrees and sidebending to 10 degrees needed for gardening/yardwork    Status  Achieved        PT Long Term Goals - 02/05/19 1037      PT LONG TERM GOAL #4   Title  FOTO functional outcome score improved to 40%    Status  Achieved            Plan - 02/19/19 1021    Clinical Impression Statement  Pt arrives feeling better than previous session ( mentally and physically). Yesterday her hip bothered her [place] [employer]. Today actually had some LT low back pain and nothing on the right. After session her pain was abolished and knee felt more loose. We added her standing band postural strengthening exercises to her HEP today.    Personal Factors and Comorbidities  Age;Past/Current Experience;Comorbidity 1;Comorbidity 2;Comorbidity 3+    Comorbidities  osteoporosis, previous back surgery;  bil knee stiffness/contracture    Examination-Activity Limitations  Caring for Others;Lift;Stand;Stairs;Squat;Sleep;Bend;Locomotion Level    Examination-Participation Restrictions  Church;Meal Prep;Cleaning;Community Activity;Shop;Yard Work    [employer] officer  Evolving/Moderate  complexity    Rehab Potential  Good    PT Frequency  2x / week    PT Duration  8 weeks    PT Treatment/Interventions  ADLs/Self Care Home Management;Aquatic Therapy;Cryotherapy;Electrical Stimulation;Moist Heat;Ultrasound;Functional mobility training;Therapeutic activities;Therapeutic exercise;Neuromuscular re-education;Manual techniques;Patient/family education;Dry needling;Taping    PT Next Visit Plan  2 vists  next week, re-eval uation on Friday.    PT Home Exercise Plan  Pt has ex from Pt instructions in the chart, not [employer]    Consulted and Agree with Plan of Care  Patient       Patient will benefit from skilled therapeutic intervention in order to improve the following deficits and impairments:  Decreased range of motion, Difficulty walking, Pain, Decreased activity tolerance, Impaired perceived functional ability, Decreased strength, Postural dysfunction  Visit Diagnosis: Muscle weakness (generalized)  Chronic bilateral low back pain with right-sided sciatica  Difficulty in walking, not elsewhere classified     Problem List Patient Active Problem List   Diagnosis Date Noted  . Essential hypertension 04/12/2018  . Pain in left foot 01/18/2017  . Trigger finger of left hand 01/18/2017  . Left knee DJD 01/25/2016  . Primary osteoarthritis of left knee 01/24/2012  . Decreased libido 11/05/2011  . Osteoarthritis of left knee 11/11/2010  . Impaired glucose tolerance 11/11/2010  . Vitamin D deficiency 11/11/2010  . Obesity 07/14/2010  . Mitral valve prolapse 07/14/2010  . Hyperlipemia 10/01/2007  . OBSTRUCTIVE SLEEP APNEA 10/01/2007    [name], PTA 02/19/2019, 11:18 AM  [facility] [address], [place], [place], [address] Phone: [phone]   Fax:  [phone]  Name: [name] MRN: [id number] Date of Birth: [birth year]

## 2019-02-24 ENCOUNTER — Other Ambulatory Visit: Payer: Self-pay

## 2019-02-24 ENCOUNTER — Encounter: Payer: Self-pay | Admitting: Physical Therapy

## 2019-02-24 ENCOUNTER — Ambulatory Visit: Payer: Medicare Other | Admitting: Physical Therapy

## 2019-02-24 DIAGNOSIS — M5441 Lumbago with sciatica, right side: Secondary | ICD-10-CM

## 2019-02-24 DIAGNOSIS — R262 Difficulty in walking, not elsewhere classified: Secondary | ICD-10-CM | POA: Diagnosis not present

## 2019-02-24 DIAGNOSIS — M6281 Muscle weakness (generalized): Secondary | ICD-10-CM

## 2019-02-24 DIAGNOSIS — G8929 Other chronic pain: Secondary | ICD-10-CM | POA: Diagnosis not present

## 2019-02-24 NOTE — Therapy (Signed)
[facility] [address] [place], [place], [address] Phone: [phone]   Fax:  [phone]  Physical Therapy Treatment  Patient Details  Name: [name] MRN: [id number] Date of Birth: 10-05-1945 Referring Provider (PT): Dr. Renold Genta Maryjean Ka in Feb 1 for injection)   Encounter Date: 02/24/2019  PT End of Session - 02/24/19 1611    Visit Number  13    Date for PT Re-Evaluation  02/26/19    Authorization Type  Medicare BCBS  Progress note at visit 18    PT Start Time  1517    PT Stop Time  1620    PT Time Calculation (min)  63 min    Activity Tolerance  Patient tolerated treatment well    Behavior During Therapy  [facility] for tasks assessed/performed       Past Medical History:  Diagnosis Date  . Arthritis   . Genital HSV 03/2013   Positive HSV 1 genital PCR  . Heart murmur   . Hyperlipidemia   . Hypertension   . Mitral valve prolapse    since age 65  . Mitral valve prolapse   . Overactive bladder   . Ringing in ears 2010   interfers with clarity of hearing at times  . Sleep apnea    pt does not use CPAP  . Stress incontinence in female    wears a pad    Past Surgical History:  Procedure Laterality Date  . BACK SURGERY    . BREAST BIOPSY Left 08/07/2015   benign  . BREAST EXCISIONAL BIOPSY Right 01/27/1996   benign  . BREAST SURGERY     BENIGN RIGHT BR MASS,Bx's X 2  . FOOT SURGERY  2013   CORRECTION OF "HAMMER TOES" -LEFT FOOT  . HAND SURGERY     RIGHT THUMB BONE SPUR  . JOINT REPLACEMENT    . KNEE ARTHROSCOPY  2006, 2010, 2012   LEFT KNEE IN  2010/ RIGHT KNEE IN 2006 AND 2012  . KNEE ARTHROSCOPY  01/24/2012   Procedure: ARTHROSCOPY KNEE;  Surgeon: Johnn Hai, MD;  Location: [facility];  Service: Orthopedics;  Laterality: Left;  left knee arthroscopy with debridment, partial lateral menisectomy  . OOPHORECTOMY     RIGHT OVARY REMOVED  . SPINE SURGERY    .  TONSILLECTOMY    . TOTAL KNEE ARTHROPLASTY  2012   Right  . TOTAL KNEE ARTHROPLASTY Left 01/25/2016   Procedure: LEFT TOTAL KNEE ARTHROPLASTY, EVALUATION UNDER ANESTHESIA AND MANIPULATION UNDER ANESTHESIA;  Surgeon: Susa Day, MD;  Location: [facility];  Service: Orthopedics;  Laterality: Left;  . TRIGGER FINGER RELEASE Left 12/25/2017   Procedure: RELEASE TRIGGER FINGER/A-1 PULLEY LEFT MIDDLE FINGER;  Surgeon: Daryll Brod, MD;  Location: [facility];  Service: Orthopedics;  Laterality: Left;  . TUBAL LIGATION      There were no vitals filed for this visit.  Subjective Assessment - 02/24/19 1540    Subjective  Did good through weekend. Last nigth did not sleep well due to LT leg and hip pain.    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  Yes    Pain Score  3     Pain Location  Back  Pain Orientation  Left    Pain Descriptors / Indicators  Dull;Aching    Aggravating Factors   Not sure...weather    Pain Relieving Factors  sitting down, heat    Multiple Pain Sites  No                       [facility] Adult PT Treatment/Exercise - 02/24/19 0001      Lumbar Exercises: Stretches   Other Lumbar Stretch Exercise  seated thoracic extension hold 5 sec 10x   VC to not move from neck   Other Lumbar Stretch Exercise  Seated forward flexion with blue ball 8x with articulating spine to return from the core      Lumbar Exercises: Aerobic   Nustep  L4 x 10 min   Seat 11, PTA discussed status with pt.      Lumbar Exercises: Standing   Row  Strengthening;Both;15 reps;Theraband    Theraband Level (Row)  Level 3 (Green)    Shoulder Extension  AROM;Strengthening;Both;10 reps;Theraband    Theraband Level (Shoulder Extension)  Level 3 (Green)     Other Standing Lumbar Exercises  Weight shifting on mini tramp toactivate multifius 1 min each way   VC to engage lower abs more which felt better to hr back     Lumbar Exercises: Seated   Other Seated Lumbar Exercises  red band horizontal abd 10x with towel roll for thoracic extension: VC to not extend from her neck      Moist Heat Therapy   Number Minutes Moist Heat  15 Minutes    Moist Heat Location  Lumbar Spine   post session     Manual Therapy   Soft tissue mobilization  Addaday assist to Rt gluteal group , hamstring, lateral quad and proximal hip ( RF, PM)   pt sidelying did 5 min each side today per request              PT Short Term Goals - 02/05/19 1035      PT SHORT TERM GOAL #1   Title  The patient will demonstrate knowledge of basic self care strategies and initial HEP    Status  Achieved      PT SHORT TERM GOAL #2   Title  The patient will report a 25% reduction in pain with walking and standing needed for grocery shopping and work duties    Status  Achieved      PT [place] #3   Title  The patient will have improved FOTO functional outcome score to 45%    Status  Achieved      PT SHORT TERM GOAL #4   Title  The patient will have improved lumbar extension to 15 degrees and sidebending to 10 degrees needed for gardening/yardwork    Status  Achieved        PT Long Term Goals - 02/05/19 1037      PT LONG TERM GOAL #4   Title  FOTO functional outcome score improved to 40%    Status  Achieved            Plan - 02/24/19 1554    Clinical Impression Statement  Pt presents with mild left low back pain today.  Denies Rt back or hip pain. Focused on stretching her back muscles includin gthoracic extension seated. These exercises helped "my back feels looser." They were not given for HEP today. If pt reports Friday the stretching helped her back we  can issue for HEP then.    Personal Factors and Comorbidities  Age;Past/Current  Experience;Comorbidity 1;Comorbidity 2;Comorbidity 3+    Comorbidities  osteoporosis, previous back surgery;  bil knee stiffness/contracture    Examination-Activity Limitations  Caring for Others;Lift;Stand;Stairs;Squat;Sleep;Bend;Locomotion Level    Examination-Participation Restrictions  Church;Meal Prep;Cleaning;Community Activity;Shop;Yard Work    [employer]  Evolving/Moderate complexity    Rehab Potential  Good    PT Frequency  2x / week    PT Duration  8 weeks    PT Treatment/Interventions  ADLs/Self Care Home Management;Aquatic Therapy;Cryotherapy;Electrical Stimulation;Moist Heat;Ultrasound;Functional mobility training;Therapeutic activities;Therapeutic exercise;Neuromuscular re-education;Manual techniques;Patient/family education;Dry needling;Taping    PT Next Visit Plan  Re-assessment/eval on Friday. Pt MAY want to extend for 1 more month.    PT Home Exercise Plan  Pt has ex from Pt instructions in the chart, not [employer]    Consulted and Agree with Plan of Care  Patient       Patient will benefit from skilled therapeutic intervention in order to improve the following deficits and impairments:  Decreased range of motion, Difficulty walking, Pain, Decreased activity tolerance, Impaired perceived functional ability, Decreased strength, Postural dysfunction  Visit Diagnosis: Muscle weakness (generalized)  Chronic bilateral low back pain with right-sided sciatica  Difficulty in walking, not elsewhere classified     Problem List Patient Active Problem List   Diagnosis Date Noted  . Essential hypertension 04/12/2018  . Pain in left foot 01/18/2017  . Trigger finger of left hand 01/18/2017  . Left knee DJD 01/25/2016  . Primary osteoarthritis of left knee 01/24/2012  . Decreased libido 11/05/2011  . Osteoarthritis of left knee 11/11/2010  . Impaired glucose tolerance 11/11/2010  . Vitamin D deficiency 11/11/2010  . Obesity 07/14/2010  . Mitral valve  prolapse 07/14/2010  . Hyperlipemia 10/01/2007  . OBSTRUCTIVE SLEEP APNEA 10/01/2007    Yaqub Arney, PTA 02/24/2019, 4:14 PM  [facility] [address] [place], [place], [address] Phone: [phone]   Fax:  [phone]  Name: [name] MRN: [id number] Date of Birth: 03-17-1945

## 2019-02-26 ENCOUNTER — Ambulatory Visit: Payer: Medicare Other | Admitting: Physical Therapy

## 2019-02-26 ENCOUNTER — Other Ambulatory Visit: Payer: Self-pay

## 2019-02-26 ENCOUNTER — Encounter: Payer: Medicare Other | Admitting: Physical Therapy

## 2019-02-26 DIAGNOSIS — G8929 Other chronic pain: Secondary | ICD-10-CM

## 2019-02-26 DIAGNOSIS — M6281 Muscle weakness (generalized): Secondary | ICD-10-CM

## 2019-02-26 DIAGNOSIS — M5441 Lumbago with sciatica, right side: Secondary | ICD-10-CM | POA: Diagnosis not present

## 2019-02-26 DIAGNOSIS — R262 Difficulty in walking, not elsewhere classified: Secondary | ICD-10-CM

## 2019-02-26 NOTE — Therapy (Signed)
[facility] [address] [place], [place], [address] Phone: [phone]   Fax:  [phone]  Physical Therapy Treatment/Discharge Summary   Patient Details  Name: [name] MRN: [id number] Date of Birth: [birth year] Referring Provider (PT): Dr. Renold Genta Maryjean Ka in Feb 1 for injection)   Encounter Date: 02/26/2019  PT End of Session - 02/26/19 1102    Visit Number  14    Date for PT Re-Evaluation  02/26/19    Authorization Type  Medicare BCBS  Progress note at visit 18    PT Start Time  1017    PT Stop Time  1055    PT Time Calculation (min)  38 min    Activity Tolerance  Patient tolerated treatment well       Past Medical History:  Diagnosis Date  . Arthritis   . Genital HSV 03/2013   Positive HSV 1 genital PCR  . Heart murmur   . Hyperlipidemia   . Hypertension   . Mitral valve prolapse    since age 10  . Mitral valve prolapse   . Overactive bladder   . Ringing in ears 2010   interfers with clarity of hearing at times  . Sleep apnea    pt does not use CPAP  . Stress incontinence in female    wears a pad    Past Surgical History:  Procedure Laterality Date  . BACK SURGERY    . BREAST BIOPSY Left 08/07/2015   benign  . BREAST EXCISIONAL BIOPSY Right 01/27/1996   benign  . BREAST SURGERY     BENIGN RIGHT BR MASS,Bx's X 2  . FOOT SURGERY  2013   CORRECTION OF "HAMMER TOES" -LEFT FOOT  . HAND SURGERY     RIGHT THUMB BONE SPUR  . JOINT REPLACEMENT    . KNEE ARTHROSCOPY  2006, 2010, 2012   LEFT KNEE IN  2010/ RIGHT KNEE IN 2006 AND 2012  . KNEE ARTHROSCOPY  01/24/2012   Procedure: ARTHROSCOPY KNEE;  Surgeon: Johnn Hai, MD;  Location: [facility];  Service: Orthopedics;  Laterality: Left;  left knee arthroscopy with debridment, partial lateral menisectomy  . OOPHORECTOMY     RIGHT OVARY REMOVED  . SPINE SURGERY    . TONSILLECTOMY    . TOTAL KNEE ARTHROPLASTY  2012    Right  . TOTAL KNEE ARTHROPLASTY Left 01/25/2016   Procedure: LEFT TOTAL KNEE ARTHROPLASTY, EVALUATION UNDER ANESTHESIA AND MANIPULATION UNDER ANESTHESIA;  Surgeon: Susa Day, MD;  Location[facility];  Service: Orthopedics;  Laterality: Left;  . TRIGGER FINGER RELEASE Left 12/25/2017   Procedure: RELEASE TRIGGER FINGER/A-1 PULLEY LEFT MIDDLE FINGER;  Surgeon: Daryll Brod, MD;  Location: [facility];  Service: Orthopedics;  Laterality: Left;  . TUBAL LIGATION      There were no vitals filed for this visit.  Subjective Assessment - 02/26/19 1023    Subjective  This Nu-Step has really helped my hip and hip.  No pain this morning.  Walked in [place] 30 minutes with no problem yesterday.  I stood and made a big batch of cookies the other day and it didn't bother me.   I can lift the cat litter but it can cause me some pain.  Overall 89% better.    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  > 30 minutes  0/10    Diagnostic tests  MRI L3-4 lateral recess stenosis with L4 nerve root compression;  L4-5 stenosis with L5 nerve root compression'  multi level disc bulge L1-3    Currently in Pain?  No/denies    Pain Score  0-No pain         [facility] PT Assessment - 02/26/19 0001      Observation/Other Assessments   Focus on Therapeutic Outcomes (FOTO)   28% limitation      AROM   Lumbar Flexion  80    Lumbar Extension  20    Lumbar - Right Side Bend  35    Lumbar - Left Side Bend  35      Strength   Right Hip Flexion  5/5    Right Hip ABduction  4/5    Left Hip Flexion  5/5    Left Hip ABduction  4+/5    Lumbar Flexion  4/5    Lumbar Extension  4/5                   [facility] Adult PT Treatment/Exercise - 02/26/19 0001       Lumbar Exercises: Aerobic   Nustep  L4 x 10 min   Seat 11, discussed status and progress  with pt.      Lumbar Exercises: Standing   Other Standing Lumbar Exercises  verbal review of HEP       Lumbar Exercises: Seated   Sit to Stand Limitations  without UE assist with ease     Other Seated Lumbar Exercises  5# lift from floor     Other Seated Lumbar Exercises  table top slide to simulate ball roll                PT Short Term Goals - 02/26/19 1044      PT SHORT TERM GOAL #1   Title  The patient will demonstrate knowledge of basic self care strategies and initial HEP    Status  Achieved      PT SHORT TERM GOAL #2   Title  The patient will report a 25% reduction in pain with walking and standing needed for grocery shopping and work duties    Status  Achieved      PT [place] #3   Title  The patient will have improved FOTO functional outcome score to 45%    Status  Achieved      PT SHORT TERM GOAL #4   Title  The patient will have improved lumbar extension to 15 degrees and sidebending to 10 degrees needed for gardening/yardwork    Status  Achieved        PT Long Term Goals - 02/26/19 1049      PT LONG TERM GOAL #1   Title  The patient will be independent in safe self progression of HEP    Status  Achieved      PT LONG TERM GOAL #2   Title  The patient will report pain level 4/10 or less with walking 30 min for grocery shopping    Status  Achieved      PT LONG TERM GOAL #3   Title  The patient have lumbar/pelvic/hip strength grossly 4/5 to 4+/5 needed for lifting the cat litter or the heavy vacumn cleaner    Status  Achieved      PT LONG TERM GOAL #4   Title  FOTO functional outcome score improved to 40%    Status  Achieved            Plan - 02/26/19 1102    Clinical Impression Statement  The patient has made significant improvements in pain reduction,  strength/mobility and function.  She rates her overall improvement at 89%.  She is able to  walk 30 minutes with minimal pain and stand for longer periods of time for cooking without exacerbation.  She continues to do some yard work as well with minimal difficulty.  Her lumbar ROM has improved in all directions and her lumbar/pelvic/hip strength has improved to grossly 4+/5.  Her FOTO functional outcome score has significantly improved from 49% limitation to only 28% limitation.  She is highly compliant with HEP.  She has met all STGS and LTGs.  Recommend discharge from PT at this time.      PHYSICAL THERAPY DISCHARGE SUMMARY  Visits from Start of Care: 14  Current functional level related to goals / functional outcomes: See clinical impressions above   Remaining deficits: As above   Education / Equipment: HEP   Plan: Patient agrees to discharge.  Patient goals were met. Patient is being discharged due to meeting the stated rehab goals.  ?????     Patient will benefit from skilled therapeutic intervention in order to improve the following deficits and impairments:     Visit Diagnosis: Muscle weakness (generalized)  Chronic bilateral low back pain with right-sided sciatica  Difficulty in walking, not elsewhere classified     Problem List Patient Active Problem List   Diagnosis Date Noted  . Essential hypertension 04/12/2018  . Pain in left foot 01/18/2017  . Trigger finger of left hand 01/18/2017  . Left knee DJD 01/25/2016  . Primary osteoarthritis of left knee 01/24/2012  . Decreased libido 11/05/2011  . Osteoarthritis of left knee 11/11/2010  . Impaired glucose tolerance 11/11/2010  . Vitamin D deficiency 11/11/2010  . Obesity 07/14/2010  . Mitral valve prolapse 07/14/2010  . Hyperlipemia 10/01/2007  . OBSTRUCTIVE SLEEP APNEA 10/01/2007   Stacy Simpson, PT 02/26/19 11:12 AM Phone: [phone] Fax: [phone] Simpson, Stacy C 02/26/2019, 11:11 AM  [facility] [address] [place], [place], [address] Phone: [phone]   Fax:  [phone]  Name: [name] MRN: [id number] Date of Birth: 09/13/1945   

## 2019-03-02 ENCOUNTER — Ambulatory Visit: Payer: Medicare Other

## 2019-03-03 DIAGNOSIS — M48061 Spinal stenosis, lumbar region without neurogenic claudication: Secondary | ICD-10-CM | POA: Diagnosis not present

## 2019-03-03 DIAGNOSIS — M5416 Radiculopathy, lumbar region: Secondary | ICD-10-CM | POA: Diagnosis not present

## 2019-03-03 DIAGNOSIS — M961 Postlaminectomy syndrome, not elsewhere classified: Secondary | ICD-10-CM | POA: Diagnosis not present

## 2019-03-08 DIAGNOSIS — Z03818 Encounter for observation for suspected exposure to other biological agents ruled out: Secondary | ICD-10-CM | POA: Diagnosis not present

## 2019-03-13 ENCOUNTER — Ambulatory Visit: Payer: Medicare Other | Attending: Internal Medicine

## 2019-03-13 DIAGNOSIS — Z23 Encounter for immunization: Secondary | ICD-10-CM | POA: Insufficient documentation

## 2019-03-13 NOTE — Progress Notes (Signed)
   Covid-19 Vaccination Clinic  Name:  BARNETTE LOFGREEN    MRN: [id number] DOB: 02-19-1945  03/13/2019  Ms. Rohlfs was observed post Covid-19 immunization for 15 minutes without incidence. She was provided with Vaccine Information Sheet and instruction to access the V-Safe system.   Ms. Granados was instructed to call 911 with any severe reactions post vaccine: Marland Kitchen Difficulty breathing  . Swelling of your face and throat  . A fast heartbeat  . A bad rash all over your body  . Dizziness and weakness    Immunizations Administered    Name Date Dose VIS Date Route   Pfizer COVID-19 Vaccine 03/13/2019 10:11 AM 0.3 mL 12/25/2018 Intramuscular   Manufacturer: [place]   Lot: [id number]   [place]: [id number]

## 2019-03-23 ENCOUNTER — Other Ambulatory Visit: Payer: Self-pay

## 2019-03-23 MED ORDER — LOSARTAN POTASSIUM 50 MG PO TABS: 50.0000 mg | ORAL_TABLET | Freq: Every day | ORAL | 1 refills | Status: DC

## 2019-04-01 DIAGNOSIS — M5416 Radiculopathy, lumbar region: Secondary | ICD-10-CM | POA: Diagnosis not present

## 2019-04-19 DIAGNOSIS — H524 Presbyopia: Secondary | ICD-10-CM | POA: Diagnosis not present

## 2019-04-19 DIAGNOSIS — H2513 Age-related nuclear cataract, bilateral: Secondary | ICD-10-CM | POA: Diagnosis not present

## 2019-07-20 ENCOUNTER — Other Ambulatory Visit: Payer: Self-pay | Admitting: Internal Medicine

## 2019-07-20 DIAGNOSIS — Z1231 Encounter for screening mammogram for malignant neoplasm of breast: Secondary | ICD-10-CM

## 2019-08-19 DIAGNOSIS — Z20822 Contact with and (suspected) exposure to covid-19: Secondary | ICD-10-CM | POA: Diagnosis not present

## 2019-08-24 ENCOUNTER — Ambulatory Visit
Admission: RE | Admit: 2019-08-24 | Discharge: 2019-08-24 | Disposition: A | Discharge status: Home / Self Care | Payer: Medicare Other | Source: Ambulatory Visit | Attending: Internal Medicine | Admitting: Internal Medicine

## 2019-08-24 ENCOUNTER — Other Ambulatory Visit: Payer: Self-pay

## 2019-08-24 DIAGNOSIS — Z1231 Encounter for screening mammogram for malignant neoplasm of breast: Secondary | ICD-10-CM

## 2019-09-03 ENCOUNTER — Telehealth: Payer: Self-pay | Admitting: Internal Medicine

## 2019-09-03 MED ORDER — DULOXETINE HCL 60 MG PO CPEP: 60.0000 mg | ORAL_CAPSULE | Freq: Every day | ORAL | 1 refills | Status: DC

## 2019-09-03 MED ORDER — DULOXETINE HCL 30 MG PO CPEP: 30.0000 mg | ORAL_CAPSULE | Freq: Every day | ORAL | 1 refills | Status: DC

## 2019-09-03 NOTE — Telephone Encounter (Signed)
Refill x 6 months 

## 2019-09-03 NOTE — Telephone Encounter (Signed)
Received Ocean[facility] request from  [facility] [place], [place] - [address] [facility] [address] Phone:  [phone]  Fax:  [phone]       Medication - Duloxetine DR 60mg  capsules  Last Refill - 06/07/19  Last OV - 12/22/18  Last CPE - 10/01/18  Next Appointment - 10/04/19

## 2019-09-21 ENCOUNTER — Other Ambulatory Visit: Payer: Self-pay

## 2019-09-21 MED ORDER — LOSARTAN POTASSIUM 50 MG PO TABS: 50.0000 mg | ORAL_TABLET | Freq: Every day | ORAL | 0 refills | Status: DC

## 2019-09-30 ENCOUNTER — Other Ambulatory Visit: Payer: Self-pay

## 2019-09-30 ENCOUNTER — Other Ambulatory Visit: Payer: Medicare Other | Admitting: Internal Medicine

## 2019-09-30 DIAGNOSIS — I10 Essential (primary) hypertension: Secondary | ICD-10-CM | POA: Diagnosis not present

## 2019-09-30 DIAGNOSIS — M5416 Radiculopathy, lumbar region: Secondary | ICD-10-CM | POA: Diagnosis not present

## 2019-09-30 DIAGNOSIS — G4733 Obstructive sleep apnea (adult) (pediatric): Secondary | ICD-10-CM

## 2019-09-30 DIAGNOSIS — I341 Nonrheumatic mitral (valve) prolapse: Secondary | ICD-10-CM

## 2019-09-30 DIAGNOSIS — Z0000 Encounter for general adult medical examination without abnormal findings: Secondary | ICD-10-CM

## 2019-09-30 DIAGNOSIS — R7302 Impaired glucose tolerance (oral): Secondary | ICD-10-CM | POA: Diagnosis not present

## 2019-09-30 DIAGNOSIS — E7800 Pure hypercholesterolemia, unspecified: Secondary | ICD-10-CM

## 2019-09-30 DIAGNOSIS — Z96653 Presence of artificial knee joint, bilateral: Secondary | ICD-10-CM

## 2019-10-01 LAB — CBC WITH DIFFERENTIAL/PLATELET
Absolute Monocytes: 328 cells/uL (ref 200–950)
Basophils Absolute: 29 cells/uL (ref 0–200)
Basophils Relative: 0.7 %
Eosinophils Absolute: 130 cells/uL (ref 15–500)
Eosinophils Relative: 3.1 %
HCT: 43.6 % (ref 35.0–45.0)
Hemoglobin: 14.2 g/dL (ref 11.7–15.5)
Lymphs Abs: 1033 cells/uL (ref 850–3900)
MCH: 30.5 pg (ref 27.0–33.0)
MCHC: 32.6 g/dL (ref 32.0–36.0)
MCV: 93.8 fL (ref 80.0–100.0)
MPV: 10.8 fL (ref 7.5–12.5)
Monocytes Relative: 7.8 %
Neutro Abs: 2680 cells/uL (ref 1500–7800)
Neutrophils Relative %: 63.8 %
Platelets: 226 10*3/uL (ref 140–400)
RBC: 4.65 10*6/uL (ref 3.80–5.10)
RDW: 12.9 % (ref 11.0–15.0)
Total Lymphocyte: 24.6 %
WBC: 4.2 10*3/uL (ref 3.8–10.8)

## 2019-10-01 LAB — LIPID PANEL
Cholesterol: 173 mg/dL (ref <200)
HDL: 58 mg/dL (ref >=50)
LDL Cholesterol (Calc): 100 mg/dL (calc) — ABNORMAL HIGH
Non-HDL Cholesterol (Calc): 115 mg/dL (calc) (ref <130)
Total CHOL/HDL Ratio: 3 (calc) (ref <5.0)
Triglycerides: 66 mg/dL (ref <150)

## 2019-10-01 LAB — COMPLETE METABOLIC PANEL WITH GFR
AG Ratio: 2.2 (calc) (ref 1.0–2.5)
ALT: 20 U/L (ref 6–29)
AST: 16 U/L (ref 10–35)
Albumin: 4.3 g/dL (ref 3.6–5.1)
Alkaline phosphatase (APISO): 59 U/L (ref 37–153)
BUN: 21 mg/dL (ref 7–25)
CO2: 27 mmol/L (ref 20–32)
Calcium: 9.1 mg/dL (ref 8.6–10.4)
Chloride: 108 mmol/L (ref 98–110)
Creat: 0.76 mg/dL (ref 0.60–0.93)
GFR, Est African American: 90 mL/min/{1.73_m2} (ref >=60)
GFR, Est Non African American: 77 mL/min/{1.73_m2} (ref >=60)
Globulin: 2 g/dL (calc) (ref 1.9–3.7)
Glucose, Bld: 96 mg/dL (ref 65–99)
Potassium: 4.4 mmol/L (ref 3.5–5.3)
Sodium: 141 mmol/L (ref 135–146)
Total Bilirubin: 0.5 mg/dL (ref 0.2–1.2)
Total Protein: 6.3 g/dL (ref 6.1–8.1)

## 2019-10-01 LAB — TSH: TSH: 2.26 mIU/L (ref 0.40–4.50)

## 2019-10-01 LAB — HEMOGLOBIN A1C
Hgb A1c MFr Bld: 5.4 % of total Hgb (ref <5.7)
Mean Plasma Glucose: 108 (calc)
eAG (mmol/L): 6 (calc)

## 2019-10-04 ENCOUNTER — Encounter: Payer: Self-pay | Admitting: Internal Medicine

## 2019-10-04 ENCOUNTER — Other Ambulatory Visit: Payer: Self-pay

## 2019-10-04 ENCOUNTER — Ambulatory Visit (INDEPENDENT_AMBULATORY_CARE_PROVIDER_SITE_OTHER): Payer: Medicare Other | Admitting: Internal Medicine

## 2019-10-04 VITALS — BP 120/80 | HR 64 | Ht 64.0 in | Wt 200.0 lb

## 2019-10-04 DIAGNOSIS — I10 Essential (primary) hypertension: Secondary | ICD-10-CM | POA: Diagnosis not present

## 2019-10-04 DIAGNOSIS — Z96653 Presence of artificial knee joint, bilateral: Secondary | ICD-10-CM

## 2019-10-04 DIAGNOSIS — R7302 Impaired glucose tolerance (oral): Secondary | ICD-10-CM | POA: Diagnosis not present

## 2019-10-04 DIAGNOSIS — Z0000 Encounter for general adult medical examination without abnormal findings: Secondary | ICD-10-CM | POA: Diagnosis not present

## 2019-10-04 DIAGNOSIS — G4733 Obstructive sleep apnea (adult) (pediatric): Secondary | ICD-10-CM | POA: Diagnosis not present

## 2019-10-04 DIAGNOSIS — E7800 Pure hypercholesterolemia, unspecified: Secondary | ICD-10-CM

## 2019-10-04 DIAGNOSIS — M5416 Radiculopathy, lumbar region: Secondary | ICD-10-CM | POA: Diagnosis not present

## 2019-10-04 DIAGNOSIS — I341 Nonrheumatic mitral (valve) prolapse: Secondary | ICD-10-CM

## 2019-10-04 LAB — POCT URINALYSIS DIPSTICK
Appearance: NEGATIVE
Bilirubin, UA: NEGATIVE
Blood, UA: NEGATIVE
Glucose, UA: NEGATIVE
Ketones, UA: NEGATIVE
Leukocytes, UA: NEGATIVE
Nitrite, UA: NEGATIVE
Odor: NEGATIVE
Protein, UA: NEGATIVE
Spec Grav, UA: 1.01 (ref 1.010–1.025)
Urobilinogen, UA: 0.2 E.U./dL
pH, UA: 6.5 (ref 5.0–8.0)

## 2019-10-04 NOTE — Progress Notes (Signed)
Subjective:    Patient ID: [name], female    DOB: August 29, 1945, 74 y.o.   MRN: [id number]  HPI 74 year old Female for health maintenance exam, Medicare wellness, and evaluation of medical issues.  Patient has taken Cymbalta for many years for back pain.  Back pain is low back radiating to right leg.  She has had epidural steroid injection for this in the past.  Sometimes has been given a short course of prednisone.  She is found if she is not doing digging in the yard the back pain is not so bad.  She had an L4-L5 microdiscectomy October 2004.  She has a history of hyperlipidemia and impaired glucose tolerance.  History of GE reflux, obesity and osteoarthritis.  Trace mitral regurgitation and mitral valve prolapse diagnosed in 1983 on echocardiogram.  Had avulsion of the right great toenail June 2018 and avulsion of tip of index finger improving shrubbery August 2019 seen and treated by hand surgeon.  She had tetanus immunization update at that time.  History of right total knee arthroplasty February 2012.  Left knee arthroscopic surgery 2014.  Left knee arthroscopic surgery 2010 and another left knee arthroscopic surgery October 2011.  Surgery for torn meniscus right knee 2006.  Had total left knee arthroplasty January 2018.  Had benign right breast biopsy 1997, tonsillectomy and adenoidectomy at age 46, bilateral tubal ligation at age 69, right oophorectomy with D&C and uterine polypectomy in 2002.  Sulfa causes hives.  Social history: She is a widow.  Social alcohol consumption.  She does not smoke.  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  2 adult daughters, 1 is a Company secretary and then one teaches music.  Several grandchildren.  She has a female friend that she spends time with her.  Family history: Parents divorced when she was 17 years old.  She does not know father's family history.   Mother died at age 23 of heart failure, kidney failure, and scleroderma.  Patient is an only child.  Maternal grandmother died at age 49 of abdominal aortic aneurysm rupture.     Review of Systems gained 14 pounds since December 2020. Wants hearing tested by Audiologist.     Objective:   Physical Exam Blood pressure 120/80 pulse 64 pulse oximetry 97% weight 200 pounds BMI 34.33  Skin warm and dry.  No adenopathy.  TMs are clear.  Neck is supple without thyromegaly JVD or carotid bruits.  Chest clear to auscultation.  Cardiac exam regular rate and rhythm normal S1 and S2.  S2 is split.  No murmur appreciated.  No gallops.  Abdomen soft nondistended without hepatosplenomegaly masses or tenderness.  Pap deferred due to age.  Bimanual is normal.  No lower extremity pitting edema.  Neuro: No focal deficits on brief neurological exam.           Assessment & Plan:  Hypertension-stable with losartan  Hyperlipidemia-lipid panel excellent on statin medication  History of sleep apnea  Impaired glucose tolerance-hemoglobin A1c normal at 5.4%  Weight gain-needs to work on diet exercise  Osteoarthritis treated with Voltaren gel  Status post bilateral knee arthroplasties  History of right lumbar radiculopathy treated with Cymbalta.  She finds that she does not Deegan her yard that her back pain is better.  Dr. Maryjean Ka who did epidural steroid injections previously has moved away.  Plan: Return in 1 year  or as needed.  Continue same medications.  Work on diet exercise and weight loss.  Flu vaccine given today.  Subjective:   Patient presents for Medicare Annual/Subsequent preventive examination.  Review Past Medical/Family/Social: See above   Risk Factors  Current exercise habits: Recommend low-fat low carbohydrate Dietary issues discussed: Needs to exercise.  Cardiac risk factors: Hyperlipidemia, family history in mother   Depression Screen  (Note: if answer to either of the  following is "Yes", a more complete depression screening is indicated)   Over the past two weeks, have you felt down, depressed or hopeless? No  Over the past two weeks, have you felt little interest or pleasure in doing things? No Have you lost interest or pleasure in daily life? No Do you often feel hopeless? No Do you cry easily over simple problems? No   Activities of Daily Living  In your present state of health, do you have any difficulty performing the following activities?:   Driving? No  Managing money? No  Feeding yourself? No  Getting from bed to chair? No  Climbing a flight of stairs? No  Preparing food and eating?: No  Bathing or showering? No  Getting dressed: No  Getting to the toilet? No  Using the toilet:No  Moving around from place to place: No  In the past year have you fallen or had a near fall?:  Yes stumbled and lost balance in my yard Are you sexually active? No  Do you have more than one partner? No   Hearing Difficulties: No  Do you often ask people to speak up or repeat themselves?  Do you experience ringing or noises in your ears?  Yes Do you have difficulty understanding soft or whispered voices?  Yes Do you feel that you have a problem with memory?  Occasionally forgetful Do you often misplace items? No    Home Safety:  Do you have a smoke alarm at your residence? Yes Do you have grab bars in the bathroom?  None Do you have throw rugs in your house?  Yes   Cognitive Testing  Alert? Yes Normal Appearance?Yes  Oriented to person? Yes Place? Yes  Time? Yes  Recall of three objects? Yes  Can perform simple calculations? Yes  Displays appropriate judgment?Yes  Can read the correct time from a watch face?Yes   List the Names of Other Physician/Practitioners you currently use:  See referral list for the physicians patient is currently seeing.     Review of Systems: See above   Objective:     General appearance: Appears stated age and  mildly obese  Head: Normocephalic, without obvious abnormality, atraumatic  Eyes: conj clear, EOMi PEERLA  Ears: normal TM's and external ear canals both ears  Nose: Nares normal. Septum midline. Mucosa normal. No drainage or sinus tenderness.  Throat: lips, mucosa, and tongue normal; teeth and gums normal  Neck: no adenopathy, no carotid bruit, no JVD, supple, symmetrical, trachea midline and thyroid not enlarged, symmetric, no tenderness/mass/nodules  No CVA tenderness.  Lungs: clear to auscultation bilaterally  Breasts: normal appearance, no masses or tenderness Heart: regular rate and rhythm, S1, S2 normal, no murmur, click, rub or gallop  Abdomen: soft, non-tender; bowel sounds normal; no masses, no organomegaly  Musculoskeletal: ROM normal in all joints, no crepitus, no deformity, Normal muscle strengthen. Back  is symmetric, no curvature. Skin: Skin color, texture, turgor normal. No rashes or lesions  Lymph nodes: Cervical, supraclavicular, and axillary nodes normal.  Neurologic: CN 2 -  12 Normal, Normal symmetric reflexes. Normal coordination and gait  Psych: Alert & Oriented x 3, Mood appear stable.    Assessment:    Annual wellness medicare exam   Plan:    During the course of the visit the patient was educated and counseled about appropriate screening and preventive services including:     Annual mammogram  Annual flu vaccine  Have COVID-19 booster when available   Patient Instructions (the written plan) was given to the patient.  Medicare Attestation  I have personally reviewed:  The patient's medical and social history  Their use of alcohol, tobacco or illicit drugs  Their current medications and supplements  The patient's functional ability including ADLs,fall risks, home safety risks, cognitive, and hearing and visual impairment  Diet and physical activities  Evidence for depression or mood disorders  The patient's weight, height, BMI, and visual acuity have  been recorded in the chart. I have made referrals, counseling, and provided education to the patient based on review of the above and I have provided the patient with a written personalized care plan for preventive services.

## 2019-10-04 NOTE — Patient Instructions (Signed)
It was a pleasure to see you today.  Flu vaccine given.  Continue current medications.  Work on diet exercise and weight loss.  Follow-up in 1 year or as needed.  Have COVID-19 booster in the near future.

## 2019-10-20 DIAGNOSIS — Z23 Encounter for immunization: Secondary | ICD-10-CM | POA: Diagnosis not present

## 2019-11-29 ENCOUNTER — Telehealth: Payer: Self-pay

## 2019-11-29 NOTE — Telephone Encounter (Signed)
This was mentioned in September and her urine was normal. We can see her and refer her to Urologist but takes at least 4 weeks to get an appt.The meds available are not all that helpful as I told her previously.

## 2019-11-29 NOTE — Telephone Encounter (Signed)
Patient would like to come in she said her urinary incontinence is getting worse for the past 2-3 weeks, she has to change more frequently and she don't even know she is going. She would like an appt she said it is not urgent.

## 2019-12-03 ENCOUNTER — Ambulatory Visit (INDEPENDENT_AMBULATORY_CARE_PROVIDER_SITE_OTHER): Payer: Medicare Other | Admitting: Internal Medicine

## 2019-12-03 ENCOUNTER — Other Ambulatory Visit: Payer: Self-pay

## 2019-12-03 ENCOUNTER — Encounter: Payer: Self-pay | Admitting: Internal Medicine

## 2019-12-03 VITALS — BP 120/70 | HR 77 | Temp 98.6°F | Ht 64.0 in | Wt 202.0 lb

## 2019-12-03 DIAGNOSIS — N3946 Mixed incontinence: Secondary | ICD-10-CM | POA: Diagnosis not present

## 2019-12-03 DIAGNOSIS — N3941 Urge incontinence: Secondary | ICD-10-CM

## 2019-12-03 DIAGNOSIS — R32 Unspecified urinary incontinence: Secondary | ICD-10-CM | POA: Diagnosis not present

## 2019-12-03 LAB — POCT URINALYSIS DIPSTICK
Appearance: NEGATIVE
Bilirubin, UA: NEGATIVE
Blood, UA: NEGATIVE
Glucose, UA: NEGATIVE
Ketones, UA: NEGATIVE
Leukocytes, UA: NEGATIVE
Nitrite, UA: NEGATIVE
Odor: NEGATIVE
Protein, UA: NEGATIVE
Spec Grav, UA: 1.015 (ref 1.010–1.025)
Urobilinogen, UA: 0.2 E.U./dL
pH, UA: 6.5 (ref 5.0–8.0)

## 2019-12-03 MED ORDER — MIRABEGRON ER 25 MG PO TB24: 25.0000 mg | ORAL_TABLET | Freq: Every day | ORAL | 2 refills | Status: DC

## 2019-12-03 NOTE — Patient Instructions (Addendum)
No evidence of urinary infection.  Try Myrbetriq 25 mg extended release preparation daily.

## 2019-12-03 NOTE — Progress Notes (Signed)
   Subjective:    Patient ID: [name], female    DOB: 1945-06-16, 74 y.o.   MRN: [id number]  HPI 24 eay old Female with extreme urinary urgency and incontinence exacerbated by yard work. Has nocturia. Wears pads for protection. Denies dysuria fever or chills. Has been dealing with this for sometime. Interferes with activities. Dipstick urine specimen is normal. No evidence of hematuria or LE.  Hx of low back pain and has been on Cymbalta for many years for pain.   History of pulse tolerance, GE reflux, obesity, osteoarthritis, hyperlipidemia.  Had L4-L5 microdiscectomy October 2004.  History of sleep apnea and uses CPAP.  Has been on beta-blocker for years for palpitations and hypertension.  History of benign right breast biopsy 1997, tonsillectomy and adenoidectomy at age 31, bilateral tubal ligation at age 45.  Right oophorectomy, D&C and uterine polypectomy 2002  Social history: She is a widow.  Social alcohol consumption.  Does not smoke.   Review of Systems see above. This has been going on for sometime but has gotten worse. It is making patient very unhappy.     Objective:   Physical Exam  BP 120/70 pulse 77 pulse ox 98.6 degrees Weight 202 pounds.  Does not have uterine prolapse    Assessment & Plan:  Severe urinary incontinence interfering with activities  Plan: I feel that Myrbetriq is probably one of the best drugs that can be used.  It may not be covered by her insurance but we will try 1 tablet by mouth daily of the ER preparation.  If this is not working.  She will need to see urologist.

## 2019-12-14 ENCOUNTER — Telehealth: Payer: Self-pay

## 2019-12-14 ENCOUNTER — Other Ambulatory Visit: Payer: Self-pay

## 2019-12-14 ENCOUNTER — Ambulatory Visit (INDEPENDENT_AMBULATORY_CARE_PROVIDER_SITE_OTHER): Payer: Medicare Other | Admitting: Internal Medicine

## 2019-12-14 ENCOUNTER — Encounter: Payer: Self-pay | Admitting: Internal Medicine

## 2019-12-14 VITALS — HR 88 | Temp 98.5°F

## 2019-12-14 DIAGNOSIS — I10 Essential (primary) hypertension: Secondary | ICD-10-CM | POA: Diagnosis not present

## 2019-12-14 DIAGNOSIS — J329 Chronic sinusitis, unspecified: Secondary | ICD-10-CM

## 2019-12-14 DIAGNOSIS — G4733 Obstructive sleep apnea (adult) (pediatric): Secondary | ICD-10-CM

## 2019-12-14 DIAGNOSIS — R7302 Impaired glucose tolerance (oral): Secondary | ICD-10-CM

## 2019-12-14 DIAGNOSIS — E7800 Pure hypercholesterolemia, unspecified: Secondary | ICD-10-CM | POA: Diagnosis not present

## 2019-12-14 DIAGNOSIS — J029 Acute pharyngitis, unspecified: Secondary | ICD-10-CM | POA: Diagnosis not present

## 2019-12-14 LAB — POCT INFLUENZA A/B
Influenza A, POC: NEGATIVE
Influenza B, POC: NEGATIVE

## 2019-12-14 LAB — POCT RAPID STREP A (OFFICE): Rapid Strep A Screen: NEGATIVE

## 2019-12-14 MED ORDER — AZITHROMYCIN 250 MG PO TABS: ORAL_TABLET | ORAL | 0 refills | Status: DC

## 2019-12-14 NOTE — Telephone Encounter (Signed)
Scheduled at 12:30pm. 

## 2019-12-14 NOTE — Telephone Encounter (Signed)
Has sinus pressure, ear pain, sore throat and chills temperature is 99.4.  Started on Sunday night with a sore throat and it got worse. She would like an appointment.

## 2019-12-14 NOTE — Telephone Encounter (Signed)
She will need a car visit

## 2019-12-14 NOTE — Progress Notes (Signed)
   Subjective:    Patient ID: [name], female    DOB: 10/24/1945, 75 y.o.   MRN: [id number]  HPI 74 year old Female went to [place] over Thanksgiving to spend time with her daughter and son-in-law at a time share. They went out to eat.  She also has been to church this past Sunday.  She is complaining of sore throat.  No documented fever or shaking chills.  Has some malaise.  Did not sleep well last night.  Patient has a history of essential hypertension, impaired glucose tolerance, obstructive sleep apnea, pure hypercholesterolemia.  She takes Cymbalta for back pain, losartan for hypertension and simvastatin for hyperlipidemia.  Social history: She resides alone. She is a widow. Non-smoker.  She has history of mixed stress and urge urinary incontinence, chronic back pain, hypertension, impaired glucose tolerance, GE reflux. History of tonsillectomy and adenoidectomy at age 70.  She is allergic to sulfa-causes hives.  Review of Systems-no nausea, vomiting, headache, or dysgeusia ,no shaking chills, she  noted some discolored mucous and brings it with her today.  It is green.     Objective:   Physical Exam Pulse 88 regular temperature 98.5 degrees pulse oximetry 96%  Skin warm and dry. Pharynx is red without exudate. TMs are slightly full but not red. Neck is supple. Chest is fairly clear to auscultation but there are some coarse breath sounds in the right lower lobe       Assessment & Plan:  Acute pharyngitis  Acute lower respiratory infection  Plan: I have obtained respiratory virus panel, COVID-19 test, rapid strep screen which was negative and rapid flu test which was negative. She is to rest and drink plenty of fluids and to start Zithromax Z-PAK 2 p.o. day 1 followed by 1 p.o. days 2 through 5. She is to contact me if symptoms worsen.

## 2019-12-14 NOTE — Patient Instructions (Signed)
COVID-19 and Respiratory virus panels obtained.  Rapid strep screen obtained.  Rapid flu test obtained.  Take Zithromax Z-PAK 2 p.o. day 1 followed by 1 p.o. days 2 through 5.  Rest and drink plenty of fluids.  May take Mucinex for congestion.  May take Tylenol if needed.

## 2019-12-15 ENCOUNTER — Other Ambulatory Visit: Payer: Self-pay | Admitting: Internal Medicine

## 2019-12-15 LAB — RESPIRATORY VIRUS PANEL

## 2019-12-15 LAB — TIQ-NTM

## 2019-12-20 LAB — RESPIRATORY VIRUS PANEL

## 2019-12-20 LAB — SARS-COV-2 RNA,(COVID-19) QUALITATIVE NAAT: SARS CoV2 RNA: NOT DETECTED

## 2020-01-09 ENCOUNTER — Other Ambulatory Visit: Payer: Self-pay | Admitting: Internal Medicine

## 2020-02-24 ENCOUNTER — Telehealth: Payer: Self-pay | Admitting: Internal Medicine

## 2020-02-24 NOTE — Telephone Encounter (Signed)
[name] [phone]  [name] called to say for the last couple of weeks she has been having indigestion, belching, when she bends over she has acid reflux and coughing. Has had COVID vaccines and booster, no exposure

## 2020-02-24 NOTE — Telephone Encounter (Signed)
Called patient and let her know to try the Pepcid [facility] for a few days and to watch diet, she verbalized understanding.

## 2020-02-24 NOTE — Telephone Encounter (Signed)
Suggest she try Pepcid AC over the counter before breakfast and dinner for several days and see if it helps. Watch diet.

## 2020-03-06 ENCOUNTER — Other Ambulatory Visit: Payer: Self-pay | Admitting: Internal Medicine

## 2020-04-07 ENCOUNTER — Other Ambulatory Visit: Payer: Self-pay | Admitting: Internal Medicine

## 2020-07-12 ENCOUNTER — Telehealth: Payer: Medicare Other

## 2020-07-12 NOTE — Telephone Encounter (Signed)
Patient called has a cough she thinks is bronchitis she is in [place]. She was requesting an appointment for for Monday 7/04 I told patient we are going to closed for the Holiday and I advised patient to go to Urgent care where she is at before it gets worse.   [phone]

## 2020-07-19 DIAGNOSIS — J0140 Acute pansinusitis, unspecified: Secondary | ICD-10-CM | POA: Diagnosis not present

## 2020-07-19 DIAGNOSIS — Z03818 Encounter for observation for suspected exposure to other biological agents ruled out: Secondary | ICD-10-CM | POA: Diagnosis not present

## 2020-07-19 DIAGNOSIS — Z20822 Contact with and (suspected) exposure to covid-19: Secondary | ICD-10-CM | POA: Diagnosis not present

## 2020-07-26 DIAGNOSIS — H2513 Age-related nuclear cataract, bilateral: Secondary | ICD-10-CM | POA: Diagnosis not present

## 2020-07-26 DIAGNOSIS — H52203 Unspecified astigmatism, bilateral: Secondary | ICD-10-CM | POA: Diagnosis not present

## 2020-07-31 ENCOUNTER — Other Ambulatory Visit: Payer: Self-pay | Admitting: Internal Medicine

## 2020-07-31 DIAGNOSIS — Z1231 Encounter for screening mammogram for malignant neoplasm of breast: Secondary | ICD-10-CM

## 2020-08-17 DIAGNOSIS — H2511 Age-related nuclear cataract, right eye: Secondary | ICD-10-CM | POA: Diagnosis not present

## 2020-08-17 DIAGNOSIS — H25811 Combined forms of age-related cataract, right eye: Secondary | ICD-10-CM | POA: Diagnosis not present

## 2020-09-04 ENCOUNTER — Other Ambulatory Visit: Payer: Self-pay | Admitting: Internal Medicine

## 2020-09-22 ENCOUNTER — Ambulatory Visit
Admission: RE | Admit: 2020-09-22 | Discharge: 2020-09-22 | Disposition: A | Discharge status: Home / Self Care | Payer: Medicare Other | Source: Ambulatory Visit | Attending: Internal Medicine | Admitting: Internal Medicine

## 2020-09-22 ENCOUNTER — Other Ambulatory Visit: Payer: Self-pay

## 2020-09-22 DIAGNOSIS — Z1231 Encounter for screening mammogram for malignant neoplasm of breast: Secondary | ICD-10-CM

## 2020-10-02 ENCOUNTER — Other Ambulatory Visit: Payer: Self-pay

## 2020-10-02 ENCOUNTER — Other Ambulatory Visit: Payer: Medicare Other | Admitting: Internal Medicine

## 2020-10-02 DIAGNOSIS — G4733 Obstructive sleep apnea (adult) (pediatric): Secondary | ICD-10-CM

## 2020-10-02 DIAGNOSIS — R7302 Impaired glucose tolerance (oral): Secondary | ICD-10-CM | POA: Diagnosis not present

## 2020-10-02 DIAGNOSIS — T50905A Adverse effect of unspecified drugs, medicaments and biological substances, initial encounter: Secondary | ICD-10-CM | POA: Diagnosis not present

## 2020-10-02 DIAGNOSIS — E7800 Pure hypercholesterolemia, unspecified: Secondary | ICD-10-CM

## 2020-10-02 DIAGNOSIS — I341 Nonrheumatic mitral (valve) prolapse: Secondary | ICD-10-CM | POA: Diagnosis not present

## 2020-10-02 DIAGNOSIS — I10 Essential (primary) hypertension: Secondary | ICD-10-CM

## 2020-10-03 LAB — CBC WITH DIFFERENTIAL/PLATELET
Absolute Monocytes: 317 cells/uL (ref 200–950)
Basophils Absolute: 40 cells/uL (ref 0–200)
Basophils Relative: 0.9 %
Eosinophils Absolute: 141 cells/uL (ref 15–500)
Eosinophils Relative: 3.2 %
HCT: 43.3 % (ref 35.0–45.0)
Hemoglobin: 14.3 g/dL (ref 11.7–15.5)
Lymphs Abs: 1241 cells/uL (ref 850–3900)
MCH: 30.5 pg (ref 27.0–33.0)
MCHC: 33 g/dL (ref 32.0–36.0)
MCV: 92.3 fL (ref 80.0–100.0)
MPV: 10.2 fL (ref 7.5–12.5)
Monocytes Relative: 7.2 %
Neutro Abs: 2662 cells/uL (ref 1500–7800)
Neutrophils Relative %: 60.5 %
Platelets: 217 10*3/uL (ref 140–400)
RBC: 4.69 10*6/uL (ref 3.80–5.10)
RDW: 12.4 % (ref 11.0–15.0)
Total Lymphocyte: 28.2 %
WBC: 4.4 10*3/uL (ref 3.8–10.8)

## 2020-10-03 LAB — COMPLETE METABOLIC PANEL WITH GFR
AG Ratio: 2.1 (calc) (ref 1.0–2.5)
ALT: 22 U/L (ref 6–29)
AST: 19 U/L (ref 10–35)
Albumin: 4.5 g/dL (ref 3.6–5.1)
Alkaline phosphatase (APISO): 58 U/L (ref 37–153)
BUN: 23 mg/dL (ref 7–25)
CO2: 28 mmol/L (ref 20–32)
Calcium: 9.2 mg/dL (ref 8.6–10.4)
Chloride: 108 mmol/L (ref 98–110)
Creat: 0.75 mg/dL (ref 0.60–1.00)
Globulin: 2.1 g/dL (calc) (ref 1.9–3.7)
Glucose, Bld: 97 mg/dL (ref 65–99)
Potassium: 4.7 mmol/L (ref 3.5–5.3)
Sodium: 143 mmol/L (ref 135–146)
Total Bilirubin: 0.6 mg/dL (ref 0.2–1.2)
Total Protein: 6.6 g/dL (ref 6.1–8.1)
eGFR: 83 mL/min/{1.73_m2} (ref >=60)

## 2020-10-03 LAB — LIPID PANEL W/REFLEX DIRECT LDL
Cholesterol: 187 mg/dL (ref <200)
HDL: 61 mg/dL (ref >=50)
LDL Cholesterol (Calc): 109 mg/dL (calc) — ABNORMAL HIGH
Non-HDL Cholesterol (Calc): 126 mg/dL (calc) (ref <130)
Total CHOL/HDL Ratio: 3.1 (calc) (ref <5.0)
Triglycerides: 76 mg/dL (ref <150)

## 2020-10-03 LAB — TSH: TSH: 2.28 mIU/L (ref 0.40–4.50)

## 2020-10-05 ENCOUNTER — Encounter: Payer: Self-pay | Admitting: Internal Medicine

## 2020-10-05 ENCOUNTER — Ambulatory Visit (INDEPENDENT_AMBULATORY_CARE_PROVIDER_SITE_OTHER): Payer: Medicare Other | Admitting: Internal Medicine

## 2020-10-05 ENCOUNTER — Other Ambulatory Visit: Payer: Self-pay

## 2020-10-05 VITALS — BP 132/82 | HR 70 | Resp 18 | Ht 64.0 in | Wt 206.0 lb

## 2020-10-05 DIAGNOSIS — R7302 Impaired glucose tolerance (oral): Secondary | ICD-10-CM

## 2020-10-05 DIAGNOSIS — I341 Nonrheumatic mitral (valve) prolapse: Secondary | ICD-10-CM

## 2020-10-05 DIAGNOSIS — N3946 Mixed incontinence: Secondary | ICD-10-CM | POA: Diagnosis not present

## 2020-10-05 DIAGNOSIS — I10 Essential (primary) hypertension: Secondary | ICD-10-CM | POA: Diagnosis not present

## 2020-10-05 DIAGNOSIS — Z0000 Encounter for general adult medical examination without abnormal findings: Secondary | ICD-10-CM

## 2020-10-05 DIAGNOSIS — Z6835 Body mass index (BMI) 35.0-35.9, adult: Secondary | ICD-10-CM | POA: Diagnosis not present

## 2020-10-05 DIAGNOSIS — Z96653 Presence of artificial knee joint, bilateral: Secondary | ICD-10-CM

## 2020-10-05 DIAGNOSIS — Z23 Encounter for immunization: Secondary | ICD-10-CM | POA: Diagnosis not present

## 2020-10-05 DIAGNOSIS — G4733 Obstructive sleep apnea (adult) (pediatric): Secondary | ICD-10-CM

## 2020-10-05 DIAGNOSIS — E7800 Pure hypercholesterolemia, unspecified: Secondary | ICD-10-CM

## 2020-10-05 LAB — POCT URINALYSIS DIPSTICK
Bilirubin, UA: NEGATIVE
Blood, UA: NEGATIVE
Glucose, UA: NEGATIVE
Ketones, UA: NEGATIVE
Leukocytes, UA: NEGATIVE
Nitrite, UA: NEGATIVE
Protein, UA: NEGATIVE
Spec Grav, UA: 1.02 (ref 1.010–1.025)
Urobilinogen, UA: 0.2 E.U./dL
pH, UA: 5 (ref 5.0–8.0)

## 2020-10-05 NOTE — Patient Instructions (Addendum)
It was a pleasure to see you today. Use Lotrisone cream daily to external genital area. Douche once a month. Mammogram normal. Flu vaccine given.  Continue current medications and follow-up in 1 year or as needed.

## 2020-10-05 NOTE — Progress Notes (Signed)
Vaccine given.  Subjective:    Patient ID: [name], female    DOB: 06-Oct-1945, 75 y.o.   MRN: [id number]  HPI 75 year old for Medicare wellness,health maintenance exam, and evaluation of medical issues.  She has taken Cymbalta for many years for back pain.  Back pain is low in the back radiating to the right leg.  She has had epidural steroid injections for this in the past.  Sometimes has been given a short course of prednisone.  She found out that digging in the backyard made symptoms worse.  She had an L4-L5 microdiscectomy in October 2004.  History of hyperlipidemia and impaired glucose tolerance.  GE reflux, obesity and osteoarthritis.  Trace mitral regurgitation and mitral valve prolapse diagnosed in 1983 on echocardiogram.  Had avulsion of the right great toenail June 2018 and avulsion of the tip of the index finger pruning shrubbery August 2019 seen and treated by hand surgeon.  She had tetanus immunization updated at that time.  History of right total knee arthroplasty February 2012.  Left knee arthroscopic surgery 2014.  Left knee arthroscopic surgery 2010 and another left knee arthroscopic surgery October 2011.  Surgery for torn meniscus right knee 2006.  Had total left knee arthroplasty January 2018.  Had benign right breast biopsy 1997, tonsillectomy and adenoidectomy at age 75.  Bilateral tubal ligation at age 42, right oophorectomy with D&C and uterine polypectomy in 2002.  Sulfa causes hives.  Social history: She is a widow.  She does not smoke.  Social alcohol consumption.  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  2 adult daughters.  One is a Company secretary and 1 teaches music.  Several grandchildren.  She spends time with an elderly female friend who lives at [employer].  Family history: Parents divorced when she was 89 years old.  She does not know father's family history.  Mother died at age 76 of heart failure,  kidney failure and scleroderma.  Patient is an only child.  Paternal grandmother died at age 19 of abdominal aortic aneurysm rupture.  Feels well and is doing some traveling. Does some gardening and yard work.  Flu vaccine given today. Advised her to get Covid booster.   Colonoscopy done 2017 with 10 year follow up given.Recent Mammogram was normal. Review of Systems      Objective:   Physical Exam Blood pressure 132/82 pulse 70 respiratory rate 18 pulse oximetry 98% weight 206 pounds BMI 35.36.  Has gained 6 pounds since last year. Skin is warm and dry.  No cervical adenopathy.  No thyromegaly or carotid bruits.  Chest is clear to auscultation.  Cardiac exam: Regular rate and rhythm without murmurs.  Breasts are without masses.  Abdomen soft nondistended without hepatosplenomegaly masses or tenderness.  No lower extremity pitting edema.  Neuro is intact without focal deficits.  Affect thought and judgment normal.  Bimanual normal.      Assessment & Plan:  She has mild elevation of LDL at 109 otherwise lab work is within normal limits.  Continue simvastatin 20 mg daily  Essential hypertension-continue losartan 50 mg daily  Chronic back pain continue with Cymbalta 30 mg +60 mg daily for total of 90 mg daily  Myrbetriq for urinary incontinence issues  BMI 35-needs to lose some weight  Vaginal odor-advised douching once a month with disposable douche.  May try Lotrisone cream to genital area for irritation.  Plan: Recommend  COVID booster this Fall.  Flu vaccine was given today.  Return in 1 year or as needed.  Had colonoscopy in 2017

## 2020-10-16 DIAGNOSIS — Z23 Encounter for immunization: Secondary | ICD-10-CM | POA: Diagnosis not present

## 2020-11-20 DIAGNOSIS — H0015 Chalazion left lower eyelid: Secondary | ICD-10-CM | POA: Diagnosis not present

## 2020-11-23 DIAGNOSIS — H2512 Age-related nuclear cataract, left eye: Secondary | ICD-10-CM | POA: Diagnosis not present

## 2020-11-23 DIAGNOSIS — H25812 Combined forms of age-related cataract, left eye: Secondary | ICD-10-CM | POA: Diagnosis not present

## 2020-12-12 ENCOUNTER — Other Ambulatory Visit: Payer: Self-pay | Admitting: Internal Medicine

## 2020-12-18 ENCOUNTER — Telehealth: Payer: Self-pay | Admitting: Internal Medicine

## 2020-12-18 ENCOUNTER — Ambulatory Visit (INDEPENDENT_AMBULATORY_CARE_PROVIDER_SITE_OTHER): Payer: Medicare Other | Admitting: Internal Medicine

## 2020-12-18 ENCOUNTER — Other Ambulatory Visit: Payer: Self-pay

## 2020-12-18 ENCOUNTER — Encounter: Payer: Self-pay | Admitting: Internal Medicine

## 2020-12-18 VITALS — Temp 100.1°F | Ht 64.0 in | Wt 206.0 lb

## 2020-12-18 DIAGNOSIS — J22 Unspecified acute lower respiratory infection: Secondary | ICD-10-CM

## 2020-12-18 MED ORDER — PREDNISONE 10 MG PO TABS: ORAL_TABLET | ORAL | 0 refills | Status: DC

## 2020-12-18 MED ORDER — DOXYCYCLINE HYCLATE 100 MG PO TABS: 100.0000 mg | ORAL_TABLET | Freq: Two times a day (BID) | ORAL | 0 refills | Status: DC

## 2020-12-18 MED ORDER — FLUCONAZOLE 150 MG PO TABS: 150.0000 mg | ORAL_TABLET | Freq: Once | ORAL | 0 refills | Status: AC

## 2020-12-18 NOTE — Telephone Encounter (Signed)
[name] [phone]  [name] called to say last Wednesday she started getting sick with bad sore throat and cough, she still has a productive cough with yellow mucus. She has been taking Musnix and Allergy, she also started taking some Delsym yesterday. I have asked to take a COVID test and call back with results.

## 2020-12-18 NOTE — Telephone Encounter (Signed)
COVID test is negative, has had COVID 2 Vaccines and 3 Boosters and Flu vaccine,

## 2020-12-18 NOTE — Telephone Encounter (Signed)
Scheduled car visit 

## 2020-12-18 NOTE — Progress Notes (Signed)
   Subjective:    Patient ID: [name], female    DOB: 03-17-1945, 75 y.o.   MRN: [id number]  HPI 75 year old Female had onset  of respiratory infection symptoms last week around November 30 that have not improved.  She developed productive cough with yellow sputum production.  We asked her to take a COVID test and she stated it was negative.  We saw her today in person.  She has received flu vaccine and several COVID vaccines.  She was seen in September for Medicare wellness health maintenance exam and evaluation of medical issues.  She has a history of hyperlipidemia, GE reflux, hypertension, obesity, osteoarthritis, and impaired glucose tolerance.  History of chronic back pain treated with Cymbalta.    Review of Systems complaining of wheezing in addition to productive cough with discolored sputum     Objective:   Physical Exam Temperature 100.1 degrees by ear thermometer, weight 206 pounds.  She appears to be very slightly tachypneic but able to give a verbal history.  She looks a bit pale and fatigued.  TMs are clear.  Pharynx slightly injected.  Chest is clear to auscultation.       Assessment & Plan:  Acute lower respiratory infection-tested negative at home for COVID-19  Chronic back pain treated with Cymbalta  Essential hypertension-stable on losartan  History of impaired glucose tolerance  History of sleep apnea  Osteoarthritis  Status post bilateral knee arthroplasties  Plan: Doxycycline 100 mg twice daily for 10 days.  Prednisone 10 mg tablets starting with 6 tablets day 1 and decreasing by 1 tablet daily i.e. 6-5-4-3-2-1 taper.  Given prescription for Diflucan to have on hand if needed for Candida vaginitis.  Addendum: She called on December 7 saying she was having a hard time sleeping due to cough and wanting something called in to help her sleep at night.  Sent in Hycodan 1 teaspoon every 8 hours as needed for cough

## 2020-12-20 ENCOUNTER — Telehealth: Payer: Self-pay | Admitting: Internal Medicine

## 2020-12-20 ENCOUNTER — Telehealth: Payer: Self-pay

## 2020-12-20 ENCOUNTER — Encounter: Payer: Self-pay | Admitting: Internal Medicine

## 2020-12-20 DIAGNOSIS — J22 Unspecified acute lower respiratory infection: Secondary | ICD-10-CM

## 2020-12-20 MED ORDER — HYDROCODONE BIT-HOMATROP MBR 5-1.5 MG/5ML PO SOLN: 5.0000 mL | Freq: Three times a day (TID) | ORAL | 0 refills | Status: DC | PRN

## 2020-12-20 NOTE — Telephone Encounter (Signed)
Patient states that she is having a hard time sleeping due to cough. She would like something called in to help her sleep at night. She uses [facility] on [address].

## 2020-12-20 NOTE — Telephone Encounter (Signed)
Notified hycodan was going to be sent in for pm use.

## 2020-12-20 NOTE — Telephone Encounter (Signed)
Patient requesting cough med for night time. E-scribing Hycodan one tsp every 8 hours as needed for cough. MJB, MD

## 2020-12-21 NOTE — Telephone Encounter (Signed)
[name] called to say she is still really coughing and having spasms to the point of choking. She does not take cough medicine during the day because of it having codeine in it. Is it to soon, should she wait a little longer to better.

## 2020-12-21 NOTE — Telephone Encounter (Signed)
Results have been relayed to the patient. The patient verbalized understanding. No questions at this time.   

## 2021-01-07 ENCOUNTER — Other Ambulatory Visit: Payer: Self-pay | Admitting: Internal Medicine

## 2021-01-29 NOTE — Patient Instructions (Signed)
Take prednisone in tapering course as directed for cough in addition to doxycycline 100 mg twice daily for 10 days.  Given prescription for Diflucan should she develop Candida vaginitis while on antibiotics and steroids.  Rest and drink fluids and monitor pulse oximetry.  Continue current long-term medications.  Addendum: Hycodan called in for her on December 7

## 2021-02-02 DIAGNOSIS — H43812 Vitreous degeneration, left eye: Secondary | ICD-10-CM | POA: Diagnosis not present

## 2021-02-23 ENCOUNTER — Other Ambulatory Visit: Payer: Self-pay | Admitting: Internal Medicine

## 2021-02-26 ENCOUNTER — Telehealth: Payer: Self-pay

## 2021-02-26 MED ORDER — DULOXETINE HCL 30 MG PO CPEP: 90.0000 mg | ORAL_CAPSULE | Freq: Every day | ORAL | 3 refills | Status: DC

## 2021-02-26 NOTE — Telephone Encounter (Signed)
Patient never picked up her 60 mg cymbalta at the pharmacy she only got the 30 mg pills but has been taking the 3 a day to equal the 90 mg daily. Insurance will not pay for the 30 mg at this time because she should have plenty unless we change the instructions. The patient states that she is fine taking the 3 30s a day versus the one 30 and one 60 mg pills a day so she wont be confused. I have sent in the 3x30 mg pills daily and cancelled the 60 mgs at the pharmacy.

## 2021-03-06 ENCOUNTER — Ambulatory Visit
Admission: RE | Admit: 2021-03-06 | Discharge: 2021-03-06 | Disposition: A | Discharge status: Home / Self Care | Payer: Medicare Other | Source: Ambulatory Visit | Attending: Internal Medicine | Admitting: Internal Medicine

## 2021-03-06 ENCOUNTER — Encounter: Payer: Self-pay | Admitting: Internal Medicine

## 2021-03-06 ENCOUNTER — Ambulatory Visit (INDEPENDENT_AMBULATORY_CARE_PROVIDER_SITE_OTHER): Payer: Medicare Other | Admitting: Internal Medicine

## 2021-03-06 ENCOUNTER — Other Ambulatory Visit: Payer: Self-pay

## 2021-03-06 VITALS — BP 108/70 | HR 68 | Temp 97.6°F | Ht 64.0 in | Wt 211.0 lb

## 2021-03-06 DIAGNOSIS — K2100 Gastro-esophageal reflux disease with esophagitis, without bleeding: Secondary | ICD-10-CM | POA: Diagnosis not present

## 2021-03-06 DIAGNOSIS — M79672 Pain in left foot: Secondary | ICD-10-CM

## 2021-03-06 DIAGNOSIS — Z6835 Body mass index (BMI) 35.0-35.9, adult: Secondary | ICD-10-CM

## 2021-03-06 DIAGNOSIS — M7989 Other specified soft tissue disorders: Secondary | ICD-10-CM | POA: Diagnosis not present

## 2021-03-06 NOTE — Patient Instructions (Addendum)
Have Xray of left foot to rule out stress fracture. Stop taking calcium as it can increase acid production and make GERD worse. Continue OTC H2 blocker daily since it is working.

## 2021-03-06 NOTE — Progress Notes (Signed)
° °  Subjective:    Patient ID: [name], female    DOB: 1946/01/14, 76 y.o.   MRN: [id number]  HPI Patient seen today for 2 problems.  One issue is left foot pain.  Denies any injury that she is aware off.  Has pain top of her foot along second metatarsal.  Has some tenderness to palpation of that area.  Has not had any redness or increased warmth of the foot she says.  Also, she is having issues with GE reflux.  Is having waterbrash if she bends over.  She tried some over-the-counter PPI medication and obtained relief but she is concerned.  The patient is overweight.  Her BMI is 35.36 which may be contributing to reflux symptoms.  Medications reviewed.  Do not see any meds that would be causing reflux.  Caffeine makes reflux symptoms worse.  Reminded her that caffeine could be associated with reflux and she probably should cut back reduced decaf coffee.  She is multiparous and would be likely to have hiatal hernia.  Apparently does not have these issues at night while sleeping.  History of chronic back pain treated with Cymbalta.  GE reflux has been noted previously on health maintenance exams but has not been bothersome.  Has obesity and osteoarthritis.  Status post bilateral knee arthroplasty.  Intolerant of sulfa-it causes hives.  Tonsillectomy and adenoidectomy at age 67.  She does not smoke.  Social alcohol consumption.  History of urinary incontinence.  History of essential hypertension.    Review of Systems see above     Objective:   Physical Exam Blood pressure 108/70 pulse 68 temperature 97.6, pulse oximetry 96% ,weight 211 pounds, BMI 36.22  Abdomen is obese soft nondistended without hepatosplenomegaly masses or tenderness.  She is tender dorsum of left foot along second metatarsal.  No redness noted dorsum of left foot.  No increased warmth.  This does not appear to be gout.       Assessment & Plan:  GE reflux-patient gets relief with over-the-counter  PPI and will continue to take that for now  Pain left second metatarsal without known injury.  We will have x-ray of left foot to rule out stress fracture.  May use Voltaren gel for pain.  Stop taking calcium supplement as it can aggravate GE reflux and acid production.  Time spent seeing patient and reviewing chart as well as medical decision making is 30 minutes

## 2021-03-06 NOTE — Progress Notes (Deleted)
° °  Subjective:    Patient ID: [name], female    DOB: [birth year], 76 y.o.   MRN: [id number]  HPI    Review of Systems     Objective:   Physical Exam        Assessment & Plan:

## 2021-03-07 DIAGNOSIS — H43813 Vitreous degeneration, bilateral: Secondary | ICD-10-CM | POA: Diagnosis not present

## 2021-05-15 ENCOUNTER — Encounter: Payer: Self-pay | Admitting: Obstetrics and Gynecology

## 2021-05-15 ENCOUNTER — Ambulatory Visit (INDEPENDENT_AMBULATORY_CARE_PROVIDER_SITE_OTHER): Payer: Medicare Other | Admitting: Obstetrics and Gynecology

## 2021-05-15 ENCOUNTER — Telehealth: Payer: Self-pay | Admitting: Obstetrics and Gynecology

## 2021-05-15 VITALS — BP 126/80 | Ht 63.5 in | Wt 210.0 lb

## 2021-05-15 DIAGNOSIS — N644 Mastodynia: Secondary | ICD-10-CM

## 2021-05-15 NOTE — Progress Notes (Signed)
GYNECOLOGY  VISIT ?  ?HPI: ?76 y.o.   Widowed  Caucasian  female   ?[id number] with No LMP recorded. Patient is postmenopausal.   ?here for right breast pain. ? ?Pain occurred at rest yesterday while watching TV and lasted 30 - 40 minutes.  ? ?She feels some tenderness under the rib cage.  ? ?Pain has recurred today.  ? ?No hormone treatment.  ?She stopped this many years ago. ? ?No new exercise routine.  ?No trauma.  ? ?She is spending a lot of time with her partner who is ill.  ? ?GYNECOLOGIC HISTORY: ?No LMP recorded. Patient is postmenopausal. ?Contraception:  post menopausal ?Menopausal hormone therapy:  n/a ?Last mammogram:  09/22/20 BI-RADS CATEGORY  1: Negative. ?Last pap smear:   08/14/16 NEG ?       ?OB History   ? ? Gravida  ?2  ? Para  ?2  ? Term  ?2  ? Preterm  ?   ? AB  ?   ? Living  ?2  ?  ? ? SAB  ?   ? IAB  ?   ? Ectopic  ?   ? Multiple  ?   ? Live Births  ?   ?   ?  ?  ?    ? ?Patient Active Problem List  ? Diagnosis Date Noted  ? Essential hypertension 04/12/2018  ? Pain in left foot 01/18/2017  ? Trigger finger of left hand 01/18/2017  ? Left knee DJD 01/25/2016  ? Primary osteoarthritis of left knee 01/24/2012  ? Decreased libido 11/05/2011  ? Osteoarthritis of left knee 11/11/2010  ? Impaired glucose tolerance 11/11/2010  ? Vitamin D deficiency 11/11/2010  ? Obesity 07/14/2010  ? Mitral valve prolapse 07/14/2010  ? Hyperlipemia 10/01/2007  ? OBSTRUCTIVE SLEEP APNEA 10/01/2007  ? ? ?Past Medical History:  ?Diagnosis Date  ? Arthritis   ? Genital HSV 03/2013  ? Positive HSV 1 genital PCR  ? Heart murmur   ? Hyperlipidemia   ? Hypertension   ? Mitral valve prolapse   ? since age 61  ? Mitral valve prolapse   ? Overactive bladder   ? Ringing in ears 2010  ? interfers with clarity of hearing at times  ? Sleep apnea   ? pt does not use CPAP  ? Stress incontinence in female   ? wears a pad  ? ? ?Past Surgical History:  ?Procedure Laterality Date  ? BACK SURGERY    ? BREAST BIOPSY Left 08/07/2015  ? benign  ?  BREAST EXCISIONAL BIOPSY Right 01/27/1996  ? benign  ? BREAST SURGERY    ? BENIGN RIGHT BR MASS,Bx's X 2  ? FOOT SURGERY  2013  ? CORRECTION OF "HAMMER TOES" -LEFT FOOT  ? HAND SURGERY    ? RIGHT THUMB BONE SPUR  ? JOINT REPLACEMENT    ? KNEE ARTHROSCOPY  2006, 2010, 2012  ? LEFT KNEE IN  2010/ RIGHT KNEE IN 2006 AND 2012  ? KNEE ARTHROSCOPY  01/24/2012  ? Procedure: ARTHROSCOPY KNEE;  Surgeon: Johnn Hai, MD;  Location: [facility];  Service: Orthopedics;  Laterality: Left;  left knee arthroscopy with debridment, partial lateral menisectomy  ? OOPHORECTOMY    ? RIGHT OVARY REMOVED  ? SPINE SURGERY    ? TONSILLECTOMY    ? TOTAL KNEE ARTHROPLASTY  2012  ? Right  ? TOTAL KNEE ARTHROPLASTY Left 01/25/2016  ? Procedure: LEFT TOTAL KNEE ARTHROPLASTY, EVALUATION UNDER ANESTHESIA AND MANIPULATION  UNDER ANESTHESIA;  Surgeon: Susa Day, MD;  Location: [facility];  Service: Orthopedics;  Laterality: Left;  ? TRIGGER FINGER RELEASE Left 12/25/2017  ? Procedure: RELEASE TRIGGER FINGER/A-1 PULLEY LEFT MIDDLE FINGER;  Surgeon: Daryll Brod, MD;  Location: [facility];  Service: Orthopedics;  Laterality: Left;  ? TUBAL LIGATION    ? ? ?Current Outpatient Medications  ?Medication Sig Dispense Refill  ? acetaminophen (TYLENOL) 500 MG tablet Take 1,000 mg by mouth every 6 (six) hours as needed (for pain).    ? ALPHA-LIPOIC ACID PO Take 1 tablet by mouth daily.    ? cholecalciferol (VITAMIN D) 1000 units tablet Take 2,000 Units by mouth at bedtime.     ? clobetasol cream (TEMOVATE) 7.32 % Apply 1 application topically 2 (two) times daily as needed (for irritation/dry skin).     ? Coenzyme Q10 (CO Q 10 PO) Take 1 tablet by mouth daily.    ? desoximetasone (TOPICORT) 0.25 % cream Apply 1 application topically 2 (two) times daily as needed (for dry skin/itching).     ? docusate sodium (COLACE) 250 MG capsule Take 250 mg by mouth at bedtime.    ? DULoxetine (CYMBALTA) 30 MG capsule Take 3 capsules (90 mg  total) by mouth daily. 90 capsule 3  ? fluticasone (CUTIVATE) 0.005 % ointment Apply 1 application topically 2 (two) times daily as needed (for dry skin/irritation.).     ? ketoconazole (NIZORAL) 2 % cream Apply topically daily. 60 g 5  ? losartan (COZAAR) 50 MG tablet TAKE 1 TABLET(50 MG) BY MOUTH DAILY 90 tablet 3  ? Omega-3 Fatty Acids (FISH OIL PO) Take 2 capsules by mouth 2 (two) times daily.     ? simvastatin (ZOCOR) 20 MG tablet TAKE 1 TABLET(20 MG) BY MOUTH DAILY 90 tablet 3  ? triamcinolone cream (KENALOG) 0.1 % Apply 1 application topically 2 (two) times daily. 30 g 0  ? TURMERIC PO Take 1 tablet by mouth at bedtime.     ? VOLTAREN 1 % GEL Apply 2 g topically 2 (two) times daily.     ? ?Current Facility-Administered Medications  ?Medication Dose Route Frequency Provider Last Rate Last Admin  ? clotrimazole-betamethasone (LOTRISONE) cream   Topical BID Elby Showers, MD      ?  ? ?ALLERGIES: Sulfa antibiotics, Codeine, and Morphine ? ?Family History  ?Problem Relation Age of Onset  ? Hypertension Mother   ? Heart disease Mother   ? Kidney failure Mother   ? Other Mother   ?     sclaraderma  ? Diabetes Maternal Grandmother   ?     TYPE 2  ? Hypertension Maternal Grandmother   ? Other Maternal Grandmother   ?     abdominal anersym  ? Heart attack Father   ? ? ?Social History  ? ?Socioeconomic History  ? Marital status: Widowed  ?  Spouse name: Not on file  ? Number of children: 2  ? Years of education: Clg, RN  ? Highest education level: Not on file  ?Occupational History  ? Not on file  ?Tobacco Use  ? Smoking status: Never  ? Smokeless tobacco: Never  ?Vaping Use  ? Vaping Use: Never used  ?Substance and Sexual Activity  ? Alcohol use: Yes  ?  Comment: wine socially  ? Drug use: No  ? Sexual activity: Not Currently  ?  Birth control/protection: Post-menopausal  ?  Comment: 1st intercourse 54 yo-2 partners  ?Other Topics Concern  ?  Not on file  ?Social History Narrative  ? Drinks about 2 cups of coffee a  day, 1 glass of tea a day   ? ?Social Determinants of Health  ? ?Financial Resource Strain: Not on file  ?Food Insecurity: Not on file  ?Transportation Needs: Not on file  ?Physical Activity: Not on file  ?Stress: Not on file  ?Social Connections: Not on file  ?Intimate Partner Violence: Not on file  ? ? ?Review of Systems  ?Musculoskeletal:   ?     Right Breast Pain  ? ?PHYSICAL EXAMINATION:   ? ?BP 126/80 (BP Location: Right Arm, Patient Position: Sitting, Cuff Size: Normal)   Ht 5' 3.5" (1.613 m)   Wt 210 lb (95.3 kg)   BMI 36.62 kg/m?     ?General appearance: alert, cooperative and appears stated age ?Head: Normocephalic, without obvious abnormality, atraumatic ?Neck: no adenopathy. ?Lungs: clear to auscultation bilaterally ?Breasts: normal appearance, no masses or tenderness, No nipple retraction or dimpling, No nipple discharge or bleeding, No axillary or supraclavicular adenopathy ?Heart: regular rate and rhythm ?  ?Chaperone was present for exam:  Kimalexis, CMA ? ?ASSESSMENT ? ?Right breast pain.  No mass noted. ?Status post bilateral benign breast biopsies.  ? ?PLAN ? ?Dx right mammogram and right breast US at [facility]. ?OTC Ibuprofen as needed.  ?Try heat or ice. ?Return for breast, pelvic exam and pap.  ?  ?An After Visit Summary was printed and given to the patient. ? ?22 min  total time was spent for this patient encounter, including preparation, face-to-face counseling with the patient, coordination of care, and documentation of the encounter. ? ? ? ? ? ?

## 2021-05-15 NOTE — Telephone Encounter (Signed)
Please schedule diagnostic right mammogram and [facility] breast [facility] for new onset right lateral breast pain.  ? ?Patient and I do not feel a mass.  ?

## 2021-05-15 NOTE — Patient Instructions (Signed)
Breast Tenderness Breast tenderness is a common problem for women of all ages, but may also occur in men. Breast tenderness may range from mild discomfort to severe pain. In women, the pain usually comes and goes with the menstrual cycle, but it can also be constant. Breast tenderness has many possible causes, including hormone changes, infections, and taking certain medicines. You may have tests, such as a mammogram or an ultrasound, to check for any unusual findings. Having breast tenderness usually does not mean that you have breast cancer. Follow these instructions at home: Managing pain and discomfort  If directed, put ice to the painful area. To do this: Put ice in a plastic bag. Place a towel between your skin and the bag. Leave the ice on for 20 minutes, 2-3 times a day. Wear a supportive bra, especially during exercise. You may also want to wear a supportive bra while sleeping if your breasts are very tender. Medicines Take over-the-counter and prescription medicines only as told by your health care provider. If the cause of your pain is infection, you may be prescribed an antibiotic medicine. If you were prescribed an antibiotic, take it as told by your health care provider. Do not stop taking the antibiotic even if you start to feel better. Eating and drinking Your health care provider may recommend that you lessen the amount of fat in your diet. You can do this by: Limiting fried foods. Cooking foods using methods such as baking, boiling, grilling, and broiling. Decrease the amount of caffeine in your diet. Instead, drink more water and choose caffeine-free drinks. General instructions  Keep a log of the days and times when your breasts are most tender. Ask your health care provider how to do breast exams at home. This will help you notice if you have an unusual growth or lump. Keep all follow-up visits as told by your health care provider. This is important. Contact a health care  provider if: Any part of your breast is hard, red, and hot to the touch. This may be a sign of infection. You are a woman and: Not breastfeeding and you have fluid, especially blood or pus, coming out of your nipples. Have a new or painful lump in your breast that remains after your menstrual period ends. You have a fever. Your pain does not improve or it gets worse. Your pain is interfering with your daily activities. Summary Breast tenderness may range from mild discomfort to severe pain. Breast tenderness has many possible causes, including hormone changes, infections, and taking certain medicines. It can be treated with ice, wearing a supportive bra, and medicines. Make changes to your diet if told to by your health care provider. This information is not intended to replace advice given to you by your health care provider. Make sure you discuss any questions you have with your health care provider. Document Revised: 05/25/2018 Document Reviewed: 05/25/2018 Elsevier Patient Education  2023 [employer] Inc[employer]  

## 2021-05-16 ENCOUNTER — Telehealth: Payer: Self-pay

## 2021-05-16 NOTE — Telephone Encounter (Signed)
Opened in error

## 2021-05-16 NOTE — Telephone Encounter (Signed)
Thank you for the update.  You may close this encounter.  

## 2021-05-16 NOTE — Telephone Encounter (Signed)
I called and spoke with patient and informed her of appt scheduled 05/31/21 at 8:45am (check in 8:25am) at [facility]. Patient could not hear well on her cell phone and asked me to call her home and leave the info on her voice mail there and I did. Also provided the phone number and prompts for [facility] if she would like to check on cancellation. ?

## 2021-05-18 ENCOUNTER — Ambulatory Visit
Admission: RE | Admit: 2021-05-18 | Discharge: 2021-05-18 | Disposition: A | Discharge status: Home / Self Care | Payer: Medicare Other | Source: Ambulatory Visit | Attending: Obstetrics and Gynecology | Admitting: Obstetrics and Gynecology

## 2021-05-18 DIAGNOSIS — N644 Mastodynia: Secondary | ICD-10-CM

## 2021-05-31 ENCOUNTER — Other Ambulatory Visit: Payer: Medicare Other

## 2021-07-12 ENCOUNTER — Ambulatory Visit
Admission: RE | Admit: 2021-07-12 | Discharge: 2021-07-12 | Disposition: A | Discharge status: Home / Self Care | Payer: Medicare Other | Source: Ambulatory Visit | Attending: Internal Medicine | Admitting: Internal Medicine

## 2021-07-12 ENCOUNTER — Ambulatory Visit (INDEPENDENT_AMBULATORY_CARE_PROVIDER_SITE_OTHER): Payer: Medicare Other | Admitting: Internal Medicine

## 2021-07-12 ENCOUNTER — Encounter: Payer: Self-pay | Admitting: Internal Medicine

## 2021-07-12 ENCOUNTER — Ambulatory Visit: Payer: Medicare Other | Admitting: Obstetrics and Gynecology

## 2021-07-12 ENCOUNTER — Telehealth: Payer: Self-pay | Admitting: Internal Medicine

## 2021-07-12 ENCOUNTER — Telehealth: Payer: Self-pay | Admitting: *Deleted

## 2021-07-12 VITALS — BP 128/86 | HR 75 | Wt 208.0 lb

## 2021-07-12 DIAGNOSIS — N644 Mastodynia: Secondary | ICD-10-CM | POA: Diagnosis not present

## 2021-07-12 DIAGNOSIS — R0789 Other chest pain: Secondary | ICD-10-CM | POA: Diagnosis not present

## 2021-07-12 DIAGNOSIS — R079 Chest pain, unspecified: Secondary | ICD-10-CM | POA: Diagnosis not present

## 2021-07-12 DIAGNOSIS — R0781 Pleurodynia: Secondary | ICD-10-CM

## 2021-07-12 DIAGNOSIS — M19011 Primary osteoarthritis, right shoulder: Secondary | ICD-10-CM | POA: Diagnosis not present

## 2021-07-12 DIAGNOSIS — M419 Scoliosis, unspecified: Secondary | ICD-10-CM | POA: Diagnosis not present

## 2021-07-12 NOTE — Telephone Encounter (Signed)
I recommend she see her PCP if the pain is in the chest wall.  Fortunately her breast exam and diagnostic breast imaging were normal in May.

## 2021-07-12 NOTE — Telephone Encounter (Signed)
Patient has history of right breast pain in May 2023, she did have imaging done at breast center and told to follow up in 4-5 months for imaging. Patient said she still notices a "feeling" in the same right breast, not a sharp pain. She report when she lifts her right breast and pushes towards her chest wall she feel a sore tender spot in her chest wall. Patient would like to be seen ASAP. No appointments today or tomorrow. Please advise

## 2021-07-12 NOTE — Telephone Encounter (Signed)
Patient informed. 

## 2021-07-12 NOTE — Patient Instructions (Addendum)
Patient was offered small quantity of narcotic pain med and/ or anti-inflammatory medication.  She declines both of these medications.  She will have right rib detail film and chest x-ray today.  May apply heat or ice to the area.  We will notify patient as soon as we have results available from x-rays.

## 2021-07-12 NOTE — Telephone Encounter (Signed)
[name] [id number]  [name] called to say she has sore spot on right breast area under rib cage, chest wall, she call feel it, hurts. She went to GYN and had mammogram, nothing showed up. I scheduled for this afternoon.

## 2021-07-12 NOTE — Progress Notes (Signed)
   Subjective:    Patient ID: [name], female    DOB: 1945-07-31, 76 y.o.   MRN: [id number]  HPI 76 year old Female seen for right anterior chest pain. Pain was seen by Dr. Enid Derry on May 2nd with right breast pain.Patient reported onset of pain the previous day watching TV lasting 30-40 minutes. Had recurrence on May 2nd. Her longterm female partner was ill at the time. She was spending time with him but does not recall a lot of heavy lifting.Hx of benign breast biopsies in the remote past. Patient had targeted ultrasound of right breast which proved to be negative.  She also had diagnostic right mammogram with tomosynthesis which was negative. Recently her partner passed away. She is doing well emotionally. Yesterday she was trying to put a device on her cat's neck to keep him from chewing his tail after recent surgery and fell flat onto her enclosed porch. No LOC.  She called today requesting appt for chest pain/breast pain.  She has a history of chronic back pain and has taken Cymbalta for many years.  History of hyperlipidemia and impaired glucose tolerance.  History of GE reflux and osteoarthritis.  History of trace mitral regurgitation and mitral valve prolapse diagnosed in 1993 on echocardiogram.  Had right knee total arthroplasty February 2018, left knee arthroscopic surgery 2014.  Left knee arthroscopic surgery 2010 and another left knee arthroscopic surgery October 2011.  Surgery for torn meniscus right knee 2006.  Had total left knee arthroplasty January 2018.  Benign right breast biopsy 1997.  Tonsillectomy and adenoidectomy at age 24.  Bilateral tubal ligation at age 19.  Right oophorectomy with D&C and uterine polypectomy in 2002.  Social history: She is a widow.  Does not smoke.  Social alcohol consumption.  2 adult daughters.  Review of Systems no shortness of breath.  Pain does not radiate into the neck or down the arm.     Objective:   Physical Exam Blood pressure 128/86  ,pulse 75, pulse oximetry 96% ,weight 208 pounds ,BMI 36.27, pulse oximetry is 96% on room air Skin warm and dry.  Chest is clear to auscultation without rales or wheezing.  Cardiac exam: Regular rate and rhythm without ectopy or murmurs.  No clicks or rubs heard.  She has palpable right chest wall tenderness along a rib under right breast and immediately to right breast.  Also some tenderness lateral to right breast.      Assessment & Plan:  Chest wall pain  Rib pain right side  Plan: Patient will have right rib detail films and chest x-ray.  Offered anti-inflammatory medication and/or small quantity of narcotic pain medication and she declines.  Obviously the fall at home yesterday aggravated the situation but it is my feeling she has some chest wall pain.  May apply heat or ice to the area.

## 2021-07-14 ENCOUNTER — Other Ambulatory Visit: Payer: Self-pay | Admitting: Internal Medicine

## 2021-07-23 NOTE — Progress Notes (Unsigned)
76 y.o. [id number] Widowed {Race/ethnicity:17218} female here for annual breast and pelvic exam.    PCP:     No LMP recorded. Patient is postmenopausal.           Sexually active: {yes no:314532}  The current method of family planning is post menopausal status.    Exercising: {yes no:314532}  {types:19826} Smoker:  {YES NO:22349}  Health Maintenance: Pap:  08-14-16 Neg, 06-17-13 Neg History of abnormal Pap:  {YES NO:22349} MMG:  05-18-21 Diag.Rt.Br./Neg/Birads2 Colonoscopy:  10-16-15 normal;10 years BMD: ***09-17-18  Result :Normal TDaP:  08-26-17 Gardasil:   n/a HIV: *** Hep C:*** Screening Labs:  Hb today: ***, Urine today: ***   reports that she has never smoked. She has never used smokeless tobacco. She reports current alcohol use. She reports that she does not use drugs.  Past Medical History:  Diagnosis Date   Arthritis    Genital HSV 03/2013   Positive HSV 1 genital PCR   Heart murmur    Hyperlipidemia    Hypertension    Mitral valve prolapse    since age 48   Mitral valve prolapse    Overactive bladder    Ringing in ears 2010   interfers with clarity of hearing at times   Sleep apnea    pt does not use CPAP   Stress incontinence in female    wears a pad    Past Surgical History:  Procedure Laterality Date   BACK SURGERY     BREAST BIOPSY Left 08/07/2015   benign   BREAST EXCISIONAL BIOPSY Right 01/27/1996   benign   BREAST SURGERY     BENIGN RIGHT BR MASS,Bx's X 2   FOOT SURGERY  2013   CORRECTION OF "HAMMER TOES" -LEFT FOOT   HAND SURGERY     RIGHT THUMB BONE SPUR   JOINT REPLACEMENT     KNEE ARTHROSCOPY  2006, 2010, 2012   LEFT KNEE IN  2010/ RIGHT KNEE IN 2006 AND 2012   KNEE ARTHROSCOPY  01/24/2012   Procedure: ARTHROSCOPY KNEE;  Surgeon: Johnn Hai, MD;  Location: [facility];  Service: Orthopedics;  Laterality: Left;  left knee arthroscopy with debridment, partial lateral menisectomy   OOPHORECTOMY     RIGHT OVARY REMOVED   SPINE  SURGERY     TONSILLECTOMY     TOTAL KNEE ARTHROPLASTY  2012   Right   TOTAL KNEE ARTHROPLASTY Left 01/25/2016   Procedure: LEFT TOTAL KNEE ARTHROPLASTY, EVALUATION UNDER ANESTHESIA AND MANIPULATION UNDER ANESTHESIA;  Surgeon: Susa Day, MD;  Location: [facility];  Service: Orthopedics;  Laterality: Left;   TRIGGER FINGER RELEASE Left 12/25/2017   Procedure: RELEASE TRIGGER FINGER/A-1 PULLEY LEFT MIDDLE FINGER;  Surgeon: Daryll Brod, MD;  Location: [facility];  Service: Orthopedics;  Laterality: Left;   TUBAL LIGATION      Current Outpatient Medications  Medication Sig Dispense Refill   acetaminophen (TYLENOL) 500 MG tablet Take 1,000 mg by mouth every 6 (six) hours as needed (for pain).     ALPHA-LIPOIC ACID PO Take 1 tablet by mouth daily.     cholecalciferol (VITAMIN D) 1000 units tablet Take 2,000 Units by mouth at bedtime.      Coenzyme Q10 (CO Q 10 PO) Take 1 tablet by mouth daily.     desoximetasone (TOPICORT) 0.25 % cream Apply 1 application topically 2 (two) times daily as needed (for dry skin/itching).      docusate sodium (COLACE) 250 MG capsule Take 250 mg  by mouth at bedtime.     DULoxetine (CYMBALTA) 30 MG capsule TAKE 3 CAPSULES(90 MG) BY MOUTH DAILY 90 capsule 3   fluticasone (CUTIVATE) 0.005 % ointment Apply 1 application topically 2 (two) times daily as needed (for dry skin/irritation.).      ketoconazole (NIZORAL) 2 % cream Apply topically daily. 60 g 5   losartan (COZAAR) 50 MG tablet TAKE 1 TABLET(50 MG) BY MOUTH DAILY 90 tablet 3   Omega-3 Fatty Acids (FISH OIL PO) Take 2 capsules by mouth 2 (two) times daily.      simvastatin (ZOCOR) 20 MG tablet TAKE 1 TABLET(20 MG) BY MOUTH DAILY 90 tablet 3   triamcinolone cream (KENALOG) 0.1 % Apply 1 application topically 2 (two) times daily. 30 g 0   TURMERIC PO Take 1 tablet by mouth at bedtime.      VOLTAREN 1 % GEL Apply 2 g topically 2 (two) times daily.      Current Facility-Administered Medications   Medication Dose Route Frequency Provider Last Rate Last Admin   clotrimazole-betamethasone (LOTRISONE) cream   Topical BID Baxley, Cresenciano Lick, MD        Family History  Problem Relation Age of Onset   Hypertension Mother    Heart disease Mother    Kidney failure Mother    Other Mother        sclaraderma   Diabetes Maternal Grandmother        TYPE 2   Hypertension Maternal Grandmother    Other Maternal Grandmother        abdominal anersym   Heart attack Father     Review of Systems  Exam:   There were no vitals taken for this visit.    General appearance: alert, cooperative and appears stated age Head: normocephalic, without obvious abnormality, atraumatic Neck: no adenopathy, supple, symmetrical, trachea midline and thyroid normal to inspection and palpation Lungs: clear to auscultation bilaterally Breasts: normal appearance, no masses or tenderness, No nipple retraction or dimpling, No nipple discharge or bleeding, No axillary adenopathy Heart: regular rate and rhythm Abdomen: soft, non-tender; no masses, no organomegaly Extremities: extremities normal, atraumatic, no cyanosis or edema Skin: skin color, texture, turgor normal. No rashes or lesions Lymph nodes: cervical, supraclavicular, and axillary nodes normal. Neurologic: grossly normal  Pelvic: External genitalia:  no lesions              No abnormal inguinal nodes palpated.              Urethra:  normal appearing urethra with no masses, tenderness or lesions              Bartholins and Skenes: normal                 Vagina: normal appearing vagina with normal color and discharge, no lesions              Cervix: no lesions              Pap taken: {yes no:314532} Bimanual Exam:  Uterus:  normal size, contour, position, consistency, mobility, non-tender              Adnexa: no mass, fullness, tenderness              Rectal exam: {yes no:314532}.  Confirms.              Anus:  normal sphincter tone, no  lesions  Chaperone was present for exam:  ***  Assessment:   Well woman  visit with gynecologic exam.   Plan: Mammogram screening discussed. Self breast awareness reviewed. Pap and HR HPV as above. Guidelines for Calcium, Vitamin D, regular exercise program including cardiovascular and weight bearing exercise.   Follow up annually and prn.   Additional counseling given.  {yes [id number]. _______ minutes face to face time of which over 50% was spent in counseling.    After visit summary provided.

## 2021-07-24 ENCOUNTER — Other Ambulatory Visit: Payer: Self-pay | Admitting: Obstetrics and Gynecology

## 2021-07-24 ENCOUNTER — Ambulatory Visit (INDEPENDENT_AMBULATORY_CARE_PROVIDER_SITE_OTHER): Payer: Medicare Other | Admitting: Obstetrics and Gynecology

## 2021-07-24 ENCOUNTER — Encounter: Payer: Self-pay | Admitting: Obstetrics and Gynecology

## 2021-07-24 ENCOUNTER — Telehealth: Payer: Self-pay

## 2021-07-24 ENCOUNTER — Other Ambulatory Visit (HOSPITAL_COMMUNITY)
Admission: RE | Admit: 2021-07-24 | Discharge: 2021-07-24 | Disposition: A | Discharge status: Home / Self Care | Payer: Medicare Other | Source: Ambulatory Visit | Attending: Obstetrics and Gynecology | Admitting: Obstetrics and Gynecology

## 2021-07-24 ENCOUNTER — Telehealth: Payer: Self-pay | Admitting: Obstetrics and Gynecology

## 2021-07-24 VITALS — BP 128/74 | HR 77 | Ht 64.0 in | Wt 207.0 lb

## 2021-07-24 DIAGNOSIS — Z719 Counseling, unspecified: Secondary | ICD-10-CM | POA: Diagnosis not present

## 2021-07-24 DIAGNOSIS — Z124 Encounter for screening for malignant neoplasm of cervix: Secondary | ICD-10-CM | POA: Insufficient documentation

## 2021-07-24 DIAGNOSIS — N3946 Mixed incontinence: Secondary | ICD-10-CM

## 2021-07-24 DIAGNOSIS — Z9189 Other specified personal risk factors, not elsewhere classified: Secondary | ICD-10-CM | POA: Diagnosis not present

## 2021-07-24 DIAGNOSIS — Z1151 Encounter for screening for human papillomavirus (HPV): Secondary | ICD-10-CM | POA: Insufficient documentation

## 2021-07-24 DIAGNOSIS — B009 Herpesviral infection, unspecified: Secondary | ICD-10-CM | POA: Diagnosis not present

## 2021-07-24 MED ORDER — MIRABEGRON ER 50 MG PO TB24: ORAL_TABLET | ORAL | 0 refills | Status: DC

## 2021-07-24 MED ORDER — MIRABEGRON ER 25 MG PO TB24
25.0000 mg | ORAL_TABLET | Freq: Every day | ORAL | 1 refills | Status: AC
Start: 2021-07-24 — End: ?

## 2021-07-24 NOTE — Progress Notes (Signed)
New Rx for Myrbetriq 25 mg daily sent to pharmacy.

## 2021-07-24 NOTE — Telephone Encounter (Signed)
Pharmacist called. Rx was sent for Mybetriq ER 50 mg to take 1/2 tab po daily.  He said since it is extended release tablet should not be cut, crushed or chewed. He said it is available in a 25 mg tablet if you would like to resend the Rx for that.

## 2021-07-24 NOTE — Telephone Encounter (Signed)
I just sent in a new Rx for Myrbetriq 25 mg daily for the patient.   Patient will need to be informed of this change.   I am not sure if there will be a cost difference for her.

## 2021-07-24 NOTE — Telephone Encounter (Signed)
Please make a referral for my patient to start pelvic floor therapy with Austin Miles or associate at [facility] for mixed incontinence.

## 2021-07-24 NOTE — Telephone Encounter (Signed)
Referral placed at [facility] they will call to schedule.

## 2021-07-24 NOTE — Telephone Encounter (Signed)
I spoke with patient and notified her.

## 2021-07-24 NOTE — Telephone Encounter (Signed)
Encounter reviewed and closed.  

## 2021-07-24 NOTE — Patient Instructions (Signed)

## 2021-07-26 ENCOUNTER — Ambulatory Visit: Payer: Medicare Other

## 2021-07-27 LAB — CYTOLOGY - PAP
Comment: NEGATIVE
Diagnosis: NEGATIVE
High risk HPV: NEGATIVE

## 2021-07-30 ENCOUNTER — Ambulatory Visit: Payer: Medicare Other | Attending: Obstetrics and Gynecology

## 2021-07-30 ENCOUNTER — Other Ambulatory Visit: Payer: Self-pay

## 2021-07-30 DIAGNOSIS — R293 Abnormal posture: Secondary | ICD-10-CM | POA: Diagnosis not present

## 2021-07-30 DIAGNOSIS — R279 Unspecified lack of coordination: Secondary | ICD-10-CM | POA: Insufficient documentation

## 2021-07-30 DIAGNOSIS — M6281 Muscle weakness (generalized): Secondary | ICD-10-CM | POA: Diagnosis not present

## 2021-07-30 DIAGNOSIS — N3946 Mixed incontinence: Secondary | ICD-10-CM | POA: Insufficient documentation

## 2021-07-30 NOTE — Therapy (Signed)
OUTPATIENT PHYSICAL THERAPY FEMALE PELVIC EVALUATION   Patient Name: [name] MRN: [id number] DOB:1945/12/22, 76 y.o., female Today's Date: 07/30/2021   PT End of Session - 07/30/21 0940     Visit Number 1    Date for PT Re-Evaluation 09/24/21    Authorization Type Medicare    Progress Note Due on Visit 10    PT Start Time 0930    PT Stop Time 1010    PT Time Calculation (min) 40 min             Past Medical History:  Diagnosis Date   Arthritis    Genital HSV 03/2013   Positive HSV 1 genital PCR   Heart murmur    Hyperlipidemia    Hypertension    Mitral valve prolapse    since age 67   Mitral valve prolapse    Overactive bladder    Ringing in ears 2010   interfers with clarity of hearing at times   Sleep apnea    pt does not use CPAP   Stress incontinence in female    wears a pad   Past Surgical History:  Procedure Laterality Date   BACK SURGERY     BREAST BIOPSY Left 08/07/2015   benign   BREAST EXCISIONAL BIOPSY Right 01/27/1996   benign   BREAST SURGERY     BENIGN RIGHT BR MASS,Bx's X 2   FOOT SURGERY  2013   CORRECTION OF "HAMMER TOES" -LEFT FOOT   HAND SURGERY     RIGHT THUMB BONE SPUR   JOINT REPLACEMENT     KNEE ARTHROSCOPY  2006, 2010, 2012   LEFT KNEE IN  2010/ RIGHT KNEE IN 2006 AND 2012   KNEE ARTHROSCOPY  01/24/2012   Procedure: ARTHROSCOPY KNEE;  Surgeon: Johnn Hai, MD;  Location: [facility];  Service: Orthopedics;  Laterality: Left;  left knee arthroscopy with debridment, partial lateral menisectomy   OOPHORECTOMY     RIGHT OVARY REMOVED   SPINE SURGERY     TONSILLECTOMY     TOTAL KNEE ARTHROPLASTY  2012   Right   TOTAL KNEE ARTHROPLASTY Left 01/25/2016   Procedure: LEFT TOTAL KNEE ARTHROPLASTY, EVALUATION UNDER ANESTHESIA AND MANIPULATION UNDER ANESTHESIA;  Surgeon: Susa Day, MD;  Location: [facility];  Service: Orthopedics;  Laterality: Left;   TRIGGER FINGER RELEASE Left 12/25/2017   Procedure: RELEASE  TRIGGER FINGER/A-1 PULLEY LEFT MIDDLE FINGER;  Surgeon: Daryll Brod, MD;  Location: [facility];  Service: Orthopedics;  Laterality: Left;   TUBAL LIGATION     Patient Active Problem List   Diagnosis Date Noted   Essential hypertension 04/12/2018   Pain in left foot 01/18/2017   Trigger finger of left hand 01/18/2017   Left knee DJD 01/25/2016   Primary osteoarthritis of left knee 01/24/2012   Decreased libido 11/05/2011   Osteoarthritis of left knee 11/11/2010   Impaired glucose tolerance 11/11/2010   Vitamin D deficiency 11/11/2010   Obesity 07/14/2010   Mitral valve prolapse 07/14/2010   Hyperlipemia 10/01/2007   OBSTRUCTIVE SLEEP APNEA 10/01/2007    PCP: Elby Showers, MD  REFERRING PROVIDER: Nunzio Cobbs, MD  REFERRING DIAG: [phone] (ICD-10-CM) - Mixed incontinence  THERAPY DIAG:  Abnormal posture  Muscle weakness (generalized)  Unspecified lack of coordination  Rationale for Evaluation and Treatment Rehabilitation  ONSET DATE: over 10 years  SUBJECTIVE:  SUBJECTIVE STATEMENT: Pt states that she has been having trouble with urinary urgency and incontinence for over the last 10 years. She loves gardening and notices the most difficulty with gardening - this is one she has to wear large pads. She notices with any increase in pressure she is leaking. She does kegels regularly and strains/pushes down until she is sure all the urine is coming out.  Fluid intake: Yes: limits fluid intake to help decrease leaking - tries to limit tea due to making issues worse     PAIN:  Are you having pain? No   PRECAUTIONS: None  WEIGHT BEARING RESTRICTIONS No  FALLS:  Has patient fallen in last 6 months? No  LIVING ENVIRONMENT: Lives with: lives with their  family Lives in: House/apartment   OCCUPATION: Retired  PLOF: Independent  PATIENT GOALS see an improvement in leaking  PERTINENT HISTORY:  Low back pain/surgery, oophorectomy Sexual abuse: No  BOWEL MOVEMENT Pain with bowel movement: No Type of bowel movement:Frequency almost every day and Strain Yes Stays on colace Fully empty rectum: Yes: - Leakage: No Pads: No - not for fecal incontinence Fiber supplement: No - focuses on fiber rich diet  URINATION Pain with urination: No Fully empty bladder: Yes: strains Stream:  WNL Urgency: Yes: triggers of running water and being in bathroom pulling underwear down Frequency: goes every two hours to prevent more leaking Leakage: Urge to void, Coughing, and Sneezing - gardening biggest issues Pads: Yes: largest pad when gardening - they really bother her and she gets frequent yeast infection  INTERCOURSE Pain with intercourse:  not been sexually active in a long time Ability to have vaginal penetration:  Yes: previously Climax: able to have pain free orgasm on her own Marinoff Scale: 3/3  PREGNANCY Vaginal deliveries 2 Tearing No C-section deliveries 0 Currently pregnant No  PROLAPSE No report of heaviness    OBJECTIVE:   PATIENT SURVEYS:   PFIQ-7 50 (just bladder)  COGNITION:  Overall cognitive status: Within functional limits for tasks assessed     SENSATION:  Light touch: Appears intact  Proprioception: Appears intact GAIT: Comments: Decreased knee/hip extension on Rt LE due to Rt TKA               POSTURE: rounded shoulders, forward head, increased thoracic kyphosis, and posterior pelvic tilt    PALPATION:   General  No abdominal tenderness; diastasis 2 finger widths at umbilicus with difficulty performing head lift                External Perineal Exam dryness/pallor                             Internal Pelvic Floor mild dryness noted  Patient confirms identification and approves PT to assess  internal pelvic floor and treatment Yes  PELVIC MMT: -Pelvic floor strength 2/5 with significant difficulty with isolation and large amount of contraction being gluteal and adductor -Endurance 3 seconds -Repeat contractions 4x        TONE: WNL  PROLAPSE: Mild Gr 1 anterior vaginal wall laxity  TODAY'S TREATMENT  EVAL  Neuromuscular re-education: Pelvic floor contraction training: No emotional/communication barriers or cognitive limitation. Patient is motivated to learn. Patient understands and agrees with treatment goals and plan. PT explains patient will be examined in standing, sitting, and lying down to see how their muscles and joints work. When they are ready, they will be asked to remove their underwear so  PT can examine their perineum. The patient is also given the option of providing their own chaperone as one is not provided in our facility. The patient also has the right and is explained the right to defer or refuse any part of the evaluation or treatment including the internal exam. With the patient's consent, PT will use one gloved finger to gently assess the muscles of the pelvic floor, seeing how well it contracts and relaxes and if there is muscle symmetry. After, the patient will get dressed and PT and patient will discuss exam findings and plan of care. PT and patient discuss plan of care, schedule, attendance policy and HEP activities. Quick flick training 50D Long hold training 5 x 10 sec Urge suppression technique 2 x 5 Self-care: Double voiding  Urge suppression technique Bladder irritants/increasing water intake    PATIENT EDUCATION:  Education details: See above self-care Person educated: Patient Education method: Explanation, Demonstration, Tactile cues, Verbal cues, and Handouts Education comprehension: verbalized understanding   HOME EXERCISE PROGRAM: [id number]  ASSESSMENT:  CLINICAL IMPRESSION: Patient is a 76 y.o. female who was seen today for  physical therapy evaluation and treatment for urinary urgency/incontinence for over 10 years. Exam findings notable for core weakness, abnormal posture, pelvic floor weakness 2/5 with poor coordination of contraction, decreased pelvic floor endurance 3 seconds, mild anterior vaginal wall laxity in supine, and 4 repeat contractions. Signs and symptoms are most consistent with pelvic floor weakness and poor coordination of muscle contraction. Initial treatment consisted of pelvic floor contraction training and pelvic floor strengthening program; she did very well with improving coordination of pelvic floor contraction and was able to incorporate into long holds and urge suppression technique. She will benefit from skilled PT intervention in order to decrease urinary urgency/incontinence and improve QOL.    OBJECTIVE IMPAIRMENTS Abnormal gait, decreased coordination, decreased endurance, decreased ROM, decreased strength, hypomobility, increased muscle spasms, postural dysfunction, and pain.   ACTIVITY LIMITATIONS continence and gardening  PARTICIPATION LIMITATIONS: community activity  PERSONAL FACTORS 1-2 comorbidities: 2 vaginal deliveries, oophorectomy  are also affecting patient's functional outcome.   REHAB POTENTIAL: Good  CLINICAL DECISION MAKING: Stable/uncomplicated  EVALUATION COMPLEXITY: Low   GOALS: Goals reviewed with patient? Yes  SHORT TERM GOALS: Target date: 08/27/2021  Pt will be independent with HEP.   Baseline: Goal status: INITIAL  2.  Pt will be independent with the knack, urge suppression technique, and double voiding in order to improve bladder habits and decrease urinary incontinence.   Baseline:  Goal status: INITIAL  3.  Pt will be able to completely void urine without bearing down and straining.  Baseline:  Goal status: INITIAL   LONG TERM GOALS: Target date: 09/24/2021   Pt will be independent with advanced HEP.   Baseline:  Goal status:  INITIAL  2.  Pt will demonstrate normal pelvic floor muscle tone and A/ROM, able to achieve 4/5 strength with contractions and 10 sec endurance, in order to provide appropriate lumbopelvic support in functional activities.   Baseline:  Goal status: INITIAL  3.  Pt will be able to go 2-3 hours in between voids without urgency or incontinence in order to improve QOL and perform all functional activities with less difficulty.   Baseline:  Goal status: INITIAL  4.  Pt will report no episodes of urinary incontinence in order to improve confidence in community activities and personal hygiene.   Baseline:  Goal status: INITIAL  5.  Pt will improve PFIQ-7 urinary score to <25.  Baseline: current score 50 Goal status: INITIAL    PLAN: PT FREQUENCY: 1x/week  PT DURATION: 8 weeks  PLANNED INTERVENTIONS: Therapeutic exercises, Therapeutic activity, Neuromuscular re-education, Balance training, Gait training, Patient/Family education, Self Care, Joint mobilization, Dry Needling, Biofeedback, and Manual therapy  PLAN FOR NEXT SESSION: Strict bladder retraining with use of urge suppression technique; begin core training.   Heather Roberts, PT, DPT07/17/2312:00 PM

## 2021-07-30 NOTE — Patient Instructions (Addendum)
Double-voiding:  This technique is to help with post-void dribbling, or leaking a little bit when you stand up right after urinating.  Use relaxed toileting mechanics to urinate as much as you feel like you have to without straining.  Sit back upright from leaning forward and relax this way for 10-20 seconds.  Lean forward again to finish voiding any amount more.     Urge suppression technique: A technique to help you hold urine until it's an appropriate time to go, whether this is making it home or trying to reach a specific voiding time frame according to your schedule. It helps to send signals from the bladder to the brain that say you don't actually have to void urine right now. This most likely only give you several minutes of relief at first, but repeat as needed; the benefit will last longer as you use this technique more and get into better bladder habits. ?  The technique: o Perform 5 quick flicks (Kegels) rapidly, not worrying about fully relaxing in between each (only in this technique). o Then perform several deep belly breaths while focusing on relaxing the pelvic floor. o Go do something else to help distract yourself from the urge to urinate. o Repeat as needed.   [facility] [address], [place], [place] [address] Phone # [phone] Fax [phone]

## 2021-08-14 ENCOUNTER — Ambulatory Visit: Payer: Medicare Other | Attending: Obstetrics and Gynecology | Admitting: Physical Therapy

## 2021-08-14 ENCOUNTER — Ambulatory Visit: Payer: Medicare Other

## 2021-08-14 DIAGNOSIS — R279 Unspecified lack of coordination: Secondary | ICD-10-CM | POA: Diagnosis not present

## 2021-08-14 DIAGNOSIS — R293 Abnormal posture: Secondary | ICD-10-CM | POA: Diagnosis not present

## 2021-08-14 DIAGNOSIS — M6281 Muscle weakness (generalized): Secondary | ICD-10-CM | POA: Insufficient documentation

## 2021-08-14 NOTE — Patient Instructions (Signed)

## 2021-08-14 NOTE — Therapy (Signed)
OUTPATIENT PHYSICAL THERAPY FEMALE PELVIC EVALUATION   Patient Name: [name] MRN: [id number] DOB:[birth year], 76 y.o., female Today's Date: 08/14/2021   PT End of Session - 08/14/21 1531     Visit Number 2    Date for PT Re-Evaluation 09/24/21    Authorization Type Medicare    Progress Note Due on Visit 10    PT Start Time 6222    PT Stop Time 1610    PT Time Calculation (min) 40 min    Activity Tolerance Patient tolerated treatment well    Behavior During Therapy [facility] for tasks assessed/performed             Past Medical History:  Diagnosis Date   Arthritis    Genital HSV 03/2013   Positive HSV 1 genital PCR   Heart murmur    Hyperlipidemia    Hypertension    Mitral valve prolapse    since age 55   Mitral valve prolapse    Overactive bladder    Ringing in ears 2010   interfers with clarity of hearing at times   Sleep apnea    pt does not use CPAP   Stress incontinence in female    wears a pad   Past Surgical History:  Procedure Laterality Date   BACK SURGERY     BREAST BIOPSY Left 08/07/2015   benign   BREAST EXCISIONAL BIOPSY Right 01/27/1996   benign   BREAST SURGERY     BENIGN RIGHT BR MASS,Bx's X 2   FOOT SURGERY  2013   CORRECTION OF "HAMMER TOES" -LEFT FOOT   HAND SURGERY     RIGHT THUMB BONE SPUR   JOINT REPLACEMENT     KNEE ARTHROSCOPY  2006, 2010, 2012   LEFT KNEE IN  2010/ RIGHT KNEE IN 2006 AND 2012   KNEE ARTHROSCOPY  01/24/2012   Procedure: ARTHROSCOPY KNEE;  Surgeon: Johnn Hai, MD;  Location: [place];  Service: Orthopedics;  Laterality: Left;  left knee arthroscopy with debridment, partial lateral menisectomy   OOPHORECTOMY     RIGHT OVARY REMOVED   SPINE SURGERY     TONSILLECTOMY     TOTAL KNEE ARTHROPLASTY  2012   Right   TOTAL KNEE ARTHROPLASTY Left 01/25/2016   Procedure: LEFT TOTAL KNEE ARTHROPLASTY, EVALUATION UNDER ANESTHESIA AND MANIPULATION UNDER ANESTHESIA;  Surgeon: Susa Day, MD;   Location: [facility];  Service: Orthopedics;  Laterality: Left;   TRIGGER FINGER RELEASE Left 12/25/2017   Procedure: RELEASE TRIGGER FINGER/A-1 PULLEY LEFT MIDDLE FINGER;  Surgeon: Daryll Brod, MD;  Location: [place];  Service: Orthopedics;  Laterality: Left;   TUBAL LIGATION     Patient Active Problem List   Diagnosis Date Noted   Essential hypertension 04/12/2018   Pain in left foot 01/18/2017   Trigger finger of left hand 01/18/2017   Left knee DJD 01/25/2016   Primary osteoarthritis of left knee 01/24/2012   Decreased libido 11/05/2011   Osteoarthritis of left knee 11/11/2010   Impaired glucose tolerance 11/11/2010   Vitamin D deficiency 11/11/2010   Obesity 07/14/2010   Mitral valve prolapse 07/14/2010   Hyperlipemia 10/01/2007   OBSTRUCTIVE SLEEP APNEA 10/01/2007    PCP: Elby Showers, MD  REFERRING PROVIDER: Nunzio Cobbs, MD  REFERRING DIAG: [phone] (ICD-10-CM) - Mixed incontinence  THERAPY DIAG:  Muscle weakness (generalized)  Abnormal posture  Unspecified lack of coordination  Rationale for Evaluation and Treatment Rehabilitation  ONSET DATE: over 10 years  SUBJECTIVE:                                                                                                                                                                                           SUBJECTIVE STATEMENT: Pt reports she has been having leakage still, still pushing to empty urine as needed but trying to decrease this, has been sneezing a lot and this has caused more leakage as well. Pt also reports she does go to the bathroom even without urge.   Fluid intake: Yes: limits fluid intake to help decrease leaking - tries to limit tea due to making issues worse     PAIN:  Are you having pain? No   PRECAUTIONS: None  WEIGHT BEARING RESTRICTIONS No  FALLS:  Has patient fallen in last 6 months? No  LIVING ENVIRONMENT: Lives with: lives with their family Lives  in: House/apartment   OCCUPATION: Retired  PLOF: Independent  PATIENT GOALS see an improvement in leaking  PERTINENT HISTORY:  Low back pain/surgery, oophorectomy Sexual abuse: No  BOWEL MOVEMENT Pain with bowel movement: No Type of bowel movement:Frequency almost every day and Strain Yes Stays on colace Fully empty rectum: Yes: - Leakage: No Pads: No - not for fecal incontinence Fiber supplement: No - focuses on fiber rich diet  URINATION Pain with urination: No Fully empty bladder: Yes: strains Stream:  WNL Urgency: Yes: triggers of running water and being in bathroom pulling underwear down Frequency: goes every two hours to prevent more leaking Leakage: Urge to void, Coughing, and Sneezing - gardening biggest issues Pads: Yes: largest pad when gardening - they really bother her and she gets frequent yeast infection  INTERCOURSE Pain with intercourse:  not been sexually active in a long time Ability to have vaginal penetration:  Yes: previously Climax: able to have pain free orgasm on her own Marinoff Scale: 3/3  PREGNANCY Vaginal deliveries 2 Tearing No C-section deliveries 0 Currently pregnant No  PROLAPSE No report of heaviness    OBJECTIVE:   PATIENT SURVEYS:   PFIQ-7 50 (just bladder)  COGNITION:  Overall cognitive status: Within functional limits for tasks assessed     SENSATION:  Light touch: Appears intact  Proprioception: Appears intact GAIT: Comments: Decreased knee/hip extension on Rt LE due to Rt TKA               POSTURE: rounded shoulders, forward head, increased thoracic kyphosis, and posterior pelvic tilt    PALPATION:   General  No abdominal tenderness; diastasis 2 finger widths at umbilicus with difficulty performing head lift  External Perineal Exam dryness/pallor                             Internal Pelvic Floor mild dryness noted  Patient confirms identification and approves PT to assess internal pelvic floor  and treatment Yes  PELVIC MMT: -Pelvic floor strength 2/5 with significant difficulty with isolation and large amount of contraction being gluteal and adductor -Endurance 3 seconds -Repeat contractions 4x        TONE: WNL  PROLAPSE: Mild Gr 1 anterior vaginal wall laxity  TODAY'S TREATMENT   08/14/21:  Self care: Bladder diary Bladder irritants  Decreasing "just in case" urination as able to improve bladder retraining  EVAL  Neuromuscular re-education: Pelvic floor contraction training: No emotional/communication barriers or cognitive limitation. Patient is motivated to learn. Patient understands and agrees with treatment goals and plan. PT explains patient will be examined in standing, sitting, and lying down to see how their muscles and joints work. When they are ready, they will be asked to remove their underwear so PT can examine their perineum. The patient is also given the option of providing their own chaperone as one is not provided in our facility. The patient also has the right and is explained the right to defer or refuse any part of the evaluation or treatment including the internal exam. With the patient's consent, PT will use one gloved finger to gently assess the muscles of the pelvic floor, seeing how well it contracts and relaxes and if there is muscle symmetry. After, the patient will get dressed and PT and patient will discuss exam findings and plan of care. PT and patient discuss plan of care, schedule, attendance policy and HEP activities. Quick flick training 97D Long hold training 5 x 10 sec Urge suppression technique 2 x 5 Self-care: Double voiding  Urge suppression technique Bladder irritants/increasing water intake    PATIENT EDUCATION:  Education details: See above self-care Person educated: Patient Education method: Explanation, Demonstration, Tactile cues, Verbal cues, and Handouts Education comprehension: verbalized understanding   HOME  EXERCISE PROGRAM: [id number]  ASSESSMENT:  CLINICAL IMPRESSION: Patient reports her symptoms are the same. Pt reports she has been on vacation and has been doing HEP on and off but not consistent. Pt session focused on education for bladder irritants and diary and techniques for bladder retraining. Pt had several questions, all answered by end of session. She will benefit from skilled PT intervention in order to decrease urinary urgency/incontinence and improve QOL.    OBJECTIVE IMPAIRMENTS Abnormal gait, decreased coordination, decreased endurance, decreased ROM, decreased strength, hypomobility, increased muscle spasms, postural dysfunction, and pain.   ACTIVITY LIMITATIONS continence and gardening  PARTICIPATION LIMITATIONS: community activity  PERSONAL FACTORS 1-2 comorbidities: 2 vaginal deliveries, oophorectomy  are also affecting patient's functional outcome.   REHAB POTENTIAL: Good  CLINICAL DECISION MAKING: Stable/uncomplicated  EVALUATION COMPLEXITY: Low   GOALS: Goals reviewed with patient? Yes  SHORT TERM GOALS: Target date: 08/27/2021  Pt will be independent with HEP.   Baseline: Goal status: INITIAL  2.  Pt will be independent with the knack, urge suppression technique, and double voiding in order to improve bladder habits and decrease urinary incontinence.   Baseline:  Goal status: INITIAL  3.  Pt will be able to completely void urine without bearing down and straining.  Baseline:  Goal status: INITIAL   LONG TERM GOALS: Target date: 09/24/2021   Pt will be independent with advanced HEP.  Baseline:  Goal status: INITIAL  2.  Pt will demonstrate normal pelvic floor muscle tone and A/ROM, able to achieve 4/5 strength with contractions and 10 sec endurance, in order to provide appropriate lumbopelvic support in functional activities.   Baseline:  Goal status: INITIAL  3.  Pt will be able to go 2-3 hours in between voids without urgency or incontinence  in order to improve QOL and perform all functional activities with less difficulty.   Baseline:  Goal status: INITIAL  4.  Pt will report no episodes of urinary incontinence in order to improve confidence in community activities and personal hygiene.   Baseline:  Goal status: INITIAL  5.  Pt will improve PFIQ-7 urinary score to <25.   Baseline: current score 50 Goal status: INITIAL    PLAN: PT FREQUENCY: 1x/week  PT DURATION: 8 weeks  PLANNED INTERVENTIONS: Therapeutic exercises, Therapeutic activity, Neuromuscular re-education, Balance training, Gait training, Patient/Family education, Self Care, Joint mobilization, Dry Needling, Biofeedback, and Manual therapy  PLAN FOR NEXT SESSION: begin core training, urge drill, coordination of pelvic floor with strengthening.   Stacy Gardner, PT, DPT 08/01/234:12 PM

## 2021-08-20 ENCOUNTER — Ambulatory Visit: Payer: Medicare Other | Admitting: Physical Therapy

## 2021-08-20 DIAGNOSIS — R279 Unspecified lack of coordination: Secondary | ICD-10-CM | POA: Diagnosis not present

## 2021-08-20 DIAGNOSIS — R293 Abnormal posture: Secondary | ICD-10-CM | POA: Diagnosis not present

## 2021-08-20 DIAGNOSIS — M6281 Muscle weakness (generalized): Secondary | ICD-10-CM

## 2021-08-20 NOTE — Therapy (Signed)
OUTPATIENT PHYSICAL THERAPY FEMALE PELVIC EVALUATION   Patient Name: [name] MRN: [id number] DOB:1945/12/25, 76 y.o., female Today's Date: 08/20/2021   PT End of Session - 08/20/21 1019     Visit Number 3    Date for PT Re-Evaluation 09/24/21    Authorization Type Medicare    Progress Note Due on Visit 10    PT Start Time 1018    PT Stop Time 1057    PT Time Calculation (min) 39 min    Activity Tolerance Patient tolerated treatment well    Behavior During Therapy WFL for tasks assessed/performed             Past Medical History:  Diagnosis Date   Arthritis    Genital HSV 03/2013   Positive HSV 1 genital PCR   Heart murmur    Hyperlipidemia    Hypertension    Mitral valve prolapse    since age 77   Mitral valve prolapse    Overactive bladder    Ringing in ears 2010   interfers with clarity of hearing at times   Sleep apnea    pt does not use CPAP   Stress incontinence in female    wears a pad   Past Surgical History:  Procedure Laterality Date   BACK SURGERY     BREAST BIOPSY Left 08/07/2015   benign   BREAST EXCISIONAL BIOPSY Right 01/27/1996   benign   BREAST SURGERY     BENIGN RIGHT BR MASS,Bx's X 2   FOOT SURGERY  2013   CORRECTION OF "HAMMER TOES" -LEFT FOOT   HAND SURGERY     RIGHT THUMB BONE SPUR   JOINT REPLACEMENT     KNEE ARTHROSCOPY  2006, 2010, 2012   LEFT KNEE IN  2010/ RIGHT KNEE IN 2006 AND 2012   KNEE ARTHROSCOPY  01/24/2012   Procedure: ARTHROSCOPY KNEE;  Surgeon: Johnn Hai, MD;  Location: [place];  Service: Orthopedics;  Laterality: Left;  left knee arthroscopy with debridment, partial lateral menisectomy   OOPHORECTOMY     RIGHT OVARY REMOVED   SPINE SURGERY     TONSILLECTOMY     TOTAL KNEE ARTHROPLASTY  2012   Right   TOTAL KNEE ARTHROPLASTY Left 01/25/2016   Procedure: LEFT TOTAL KNEE ARTHROPLASTY, EVALUATION UNDER ANESTHESIA AND MANIPULATION UNDER ANESTHESIA;  Surgeon: Susa Day, MD;   Location: [facility];  Service: Orthopedics;  Laterality: Left;   TRIGGER FINGER RELEASE Left 12/25/2017   Procedure: RELEASE TRIGGER FINGER/A-1 PULLEY LEFT MIDDLE FINGER;  Surgeon: Daryll Brod, MD;  Location: [place];  Service: Orthopedics;  Laterality: Left;   TUBAL LIGATION     Patient Active Problem List   Diagnosis Date Noted   Essential hypertension 04/12/2018   Pain in left foot 01/18/2017   Trigger finger of left hand 01/18/2017   Left knee DJD 01/25/2016   Primary osteoarthritis of left knee 01/24/2012   Decreased libido 11/05/2011   Osteoarthritis of left knee 11/11/2010   Impaired glucose tolerance 11/11/2010   Vitamin D deficiency 11/11/2010   Obesity 07/14/2010   Mitral valve prolapse 07/14/2010   Hyperlipemia 10/01/2007   OBSTRUCTIVE SLEEP APNEA 10/01/2007    PCP: Elby Showers, MD  REFERRING PROVIDER: Nunzio Cobbs, MD  REFERRING DIAG: [phone] (ICD-10-CM) - Mixed incontinence  THERAPY DIAG:  Muscle weakness (generalized)  Unspecified lack of coordination  Rationale for Evaluation and Treatment Rehabilitation  ONSET DATE: over 10 years  SUBJECTIVE:  SUBJECTIVE STATEMENT: Pt reports she had a large loss of bladder while staying with daughter, however had a lemonade and margarita with dinner and knew this irritate her. Pt reports she woke at 1 am to urinate without leakage but then slept at least 5 hours then woke at 7 unable to make it fully to bathroom, had loss of urine in bathroom but not able to make it to toilet.   Fluid intake: Yes: limits fluid intake to help decrease leaking - tries to limit tea due to making issues worse     PAIN:  Are you having pain? No   PRECAUTIONS: None  WEIGHT BEARING RESTRICTIONS No  FALLS:  Has patient fallen  in last 6 months? No  LIVING ENVIRONMENT: Lives with: lives with their family Lives in: House/apartment   OCCUPATION: Retired  PLOF: Independent  PATIENT GOALS see an improvement in leaking  PERTINENT HISTORY:  Low back pain/surgery, oophorectomy Sexual abuse: No  BOWEL MOVEMENT Pain with bowel movement: No Type of bowel movement:Frequency almost every day and Strain Yes Stays on colace Fully empty rectum: Yes: - Leakage: No Pads: No - not for fecal incontinence Fiber supplement: No - focuses on fiber rich diet  URINATION Pain with urination: No Fully empty bladder: Yes: strains Stream:  WNL Urgency: Yes: triggers of running water and being in bathroom pulling underwear down Frequency: goes every two hours to prevent more leaking Leakage: Urge to void, Coughing, and Sneezing - gardening biggest issues Pads: Yes: largest pad when gardening - they really bother her and she gets frequent yeast infection  INTERCOURSE Pain with intercourse:  not been sexually active in a long time Ability to have vaginal penetration:  Yes: previously Climax: able to have pain free orgasm on her own Marinoff Scale: 3/3  PREGNANCY Vaginal deliveries 2 Tearing No C-section deliveries 0 Currently pregnant No  PROLAPSE No report of heaviness    OBJECTIVE:   PATIENT SURVEYS:   PFIQ-7 50 (just bladder)  COGNITION:  Overall cognitive status: Within functional limits for tasks assessed     SENSATION:  Light touch: Appears intact  Proprioception: Appears intact GAIT: Comments: Decreased knee/hip extension on Rt LE due to Rt TKA               POSTURE: rounded shoulders, forward head, increased thoracic kyphosis, and posterior pelvic tilt    PALPATION:   General  No abdominal tenderness; diastasis 2 finger widths at umbilicus with difficulty performing head lift                External Perineal Exam dryness/pallor                             Internal Pelvic Floor mild dryness  noted  Patient confirms identification and approves PT to assess internal pelvic floor and treatment Yes  PELVIC MMT: -Pelvic floor strength 2/5 with significant difficulty with isolation and large amount of contraction being gluteal and adductor -Endurance 3 seconds -Repeat contractions 4x        TONE: WNL  PROLAPSE: Mild Gr 1 anterior vaginal wall laxity  TODAY'S TREATMENT   08/20/2021: Additional education on urge drill and "just in case" urination as pt had question about this. All answered and pt reported understanding.  Mini bridges x10 Ball squeezes in hooklying 2x10 Sidelying ball press with hip abduction 2x10 each  08/14/21:  Self care: Bladder diary Bladder irritants  Decreasing "just in case" urination as  able to improve bladder retraining    PATIENT EDUCATION:  Education details: urge drill, HEP Person educated: Patient Education method: Explanation, Demonstration, Tactile cues, Verbal cues, and Handouts Education comprehension: verbalized understanding   HOME EXERCISE PROGRAM: [id number]  ASSESSMENT:  CLINICAL IMPRESSION: Patient reports she has been traveling and been out of her normal but has been doing HEP. Pt had a large loss of bladder while staying with daughter after sleeping over 5 hours (which she states is a little long for her between voids) and drinks some irritants during dinner which per pt made it worse. Pt educated in more detail on urge drill and more on when to use this, pt denied additional questions and reports better understanding of this. Pt session then focused on coordination of pelvic floor and breathing mechanics with gentle strengthening exercises for improved mechanics and pelvic floor strength. Pt benefits from moderate amount of verbal cues for coordination and decreased breath holding during exercises and for proper technique. She will benefit from skilled PT intervention in order to decrease urinary urgency/incontinence and improve  QOL.    OBJECTIVE IMPAIRMENTS Abnormal gait, decreased coordination, decreased endurance, decreased ROM, decreased strength, hypomobility, increased muscle spasms, postural dysfunction, and pain.   ACTIVITY LIMITATIONS continence and gardening  PARTICIPATION LIMITATIONS: community activity  PERSONAL FACTORS 1-2 comorbidities: 2 vaginal deliveries, oophorectomy  are also affecting patient's functional outcome.   REHAB POTENTIAL: Good  CLINICAL DECISION MAKING: Stable/uncomplicated  EVALUATION COMPLEXITY: Low   GOALS: Goals reviewed with patient? Yes  SHORT TERM GOALS: Target date: 08/27/2021  Pt will be independent with HEP.   Baseline: Goal status: INITIAL  2.  Pt will be independent with the knack, urge suppression technique, and double voiding in order to improve bladder habits and decrease urinary incontinence.   Baseline:  Goal status: INITIAL  3.  Pt will be able to completely void urine without bearing down and straining.  Baseline:  Goal status: INITIAL   LONG TERM GOALS: Target date: 09/24/2021   Pt will be independent with advanced HEP.   Baseline:  Goal status: INITIAL  2.  Pt will demonstrate normal pelvic floor muscle tone and A/ROM, able to achieve 4/5 strength with contractions and 10 sec endurance, in order to provide appropriate lumbopelvic support in functional activities.   Baseline:  Goal status: INITIAL  3.  Pt will be able to go 2-3 hours in between voids without urgency or incontinence in order to improve QOL and perform all functional activities with less difficulty.   Baseline:  Goal status: INITIAL  4.  Pt will report no episodes of urinary incontinence in order to improve confidence in community activities and personal hygiene.   Baseline:  Goal status: INITIAL  5.  Pt will improve PFIQ-7 urinary score to <25.   Baseline: current score 50 Goal status: INITIAL    PLAN: PT FREQUENCY: 1x/week  PT DURATION: 8 weeks  PLANNED  INTERVENTIONS: Therapeutic exercises, Therapeutic activity, Neuromuscular re-education, Balance training, Gait training, Patient/Family education, Self Care, Joint mobilization, Dry Needling, Biofeedback, and Manual therapy  PLAN FOR NEXT SESSION: begin core training, urge drill, coordination of pelvic floor with strengthening.   Stacy Gardner, PT, DPT 08/20/2308:58 AM

## 2021-08-23 ENCOUNTER — Other Ambulatory Visit: Payer: Self-pay | Admitting: Obstetrics and Gynecology

## 2021-08-23 DIAGNOSIS — Z1231 Encounter for screening mammogram for malignant neoplasm of breast: Secondary | ICD-10-CM

## 2021-08-27 ENCOUNTER — Ambulatory Visit: Payer: Medicare Other | Admitting: Physical Therapy

## 2021-08-27 DIAGNOSIS — M6281 Muscle weakness (generalized): Secondary | ICD-10-CM

## 2021-08-27 DIAGNOSIS — R279 Unspecified lack of coordination: Secondary | ICD-10-CM

## 2021-08-27 DIAGNOSIS — R293 Abnormal posture: Secondary | ICD-10-CM | POA: Diagnosis not present

## 2021-08-27 NOTE — Therapy (Signed)
OUTPATIENT PHYSICAL THERAPY FEMALE PELVIC EVALUATION   Patient Name: [name] MRN: [id number] DOB:May 16, 1945, 76 y.o., female Today's Date: 08/27/2021   PT End of Session - 08/27/21 1533     Visit Number 4    Date for PT Re-Evaluation 09/24/21    Authorization Type Medicare    Progress Note Due on Visit 10    PT Start Time 5277    PT Stop Time 1608    PT Time Calculation (min) 39 min    Activity Tolerance Patient tolerated treatment well    Behavior During Therapy [facility] for tasks assessed/performed             Past Medical History:  Diagnosis Date   Arthritis    Genital HSV 03/2013   Positive HSV 1 genital PCR   Heart murmur    Hyperlipidemia    Hypertension    Mitral valve prolapse    since age 6   Mitral valve prolapse    Overactive bladder    Ringing in ears 2010   interfers with clarity of hearing at times   Sleep apnea    pt does not use CPAP   Stress incontinence in female    wears a pad   Past Surgical History:  Procedure Laterality Date   BACK SURGERY     BREAST BIOPSY Left 08/07/2015   benign   BREAST EXCISIONAL BIOPSY Right 01/27/1996   benign   BREAST SURGERY     BENIGN RIGHT BR MASS,Bx's X 2   FOOT SURGERY  2013   CORRECTION OF "HAMMER TOES" -LEFT FOOT   HAND SURGERY     RIGHT THUMB BONE SPUR   JOINT REPLACEMENT     KNEE ARTHROSCOPY  2006, 2010, 2012   LEFT KNEE IN  2010/ RIGHT KNEE IN 2006 AND 2012   KNEE ARTHROSCOPY  01/24/2012   Procedure: ARTHROSCOPY KNEE;  Surgeon: Johnn Hai, MD;  Location: [facility];  Service: Orthopedics;  Laterality: Left;  left knee arthroscopy with debridment, partial lateral menisectomy   OOPHORECTOMY     RIGHT OVARY REMOVED   SPINE SURGERY     TONSILLECTOMY     TOTAL KNEE ARTHROPLASTY  2012   Right   TOTAL KNEE ARTHROPLASTY Left 01/25/2016   Procedure: LEFT TOTAL KNEE ARTHROPLASTY, EVALUATION UNDER ANESTHESIA AND MANIPULATION UNDER ANESTHESIA;  Surgeon: Susa Day, MD;   Location: [facility];  Service: Orthopedics;  Laterality: Left;   TRIGGER FINGER RELEASE Left 12/25/2017   Procedure: RELEASE TRIGGER FINGER/A-1 PULLEY LEFT MIDDLE FINGER;  Surgeon: Daryll Brod, MD;  Location: [facility];  Service: Orthopedics;  Laterality: Left;   TUBAL LIGATION     Patient Active Problem List   Diagnosis Date Noted   Essential hypertension 04/12/2018   Pain in left foot 01/18/2017   Trigger finger of left hand 01/18/2017   Left knee DJD 01/25/2016   Primary osteoarthritis of left knee 01/24/2012   Decreased libido 11/05/2011   Osteoarthritis of left knee 11/11/2010   Impaired glucose tolerance 11/11/2010   Vitamin D deficiency 11/11/2010   Obesity 07/14/2010   Mitral valve prolapse 07/14/2010   Hyperlipemia 10/01/2007   OBSTRUCTIVE SLEEP APNEA 10/01/2007    PCP: Elby Showers, MD  REFERRING PROVIDER: Nunzio Cobbs, MD  REFERRING DIAG: [phone] (ICD-10-CM) - Mixed incontinence  THERAPY DIAG:  Muscle weakness (generalized)  Unspecified lack of coordination  Abnormal posture  Rationale for Evaluation and Treatment Rehabilitation  ONSET DATE: over 10 years  SUBJECTIVE:                                                                                                                                                                                           SUBJECTIVE STATEMENT: Pt reports she has had one large full bladder void which has not been able to do this without leakage. Pt did have some mild urine leakage this morning that she was unaware of after she was push mowing her yard and then watered yard/plants, then has leakage on pad when she went inside. This would have been ~3 hours while doing activity.   Fluid intake: Yes: limits fluid intake to help decrease leaking - tries to limit tea due to making issues worse     PAIN:  Are you having pain? No   PRECAUTIONS: None  WEIGHT BEARING RESTRICTIONS No  FALLS:  Has patient  fallen in last 6 months? No  LIVING ENVIRONMENT: Lives with: lives with their family Lives in: House/apartment   OCCUPATION: Retired  PLOF: Independent  PATIENT GOALS see an improvement in leaking  PERTINENT HISTORY:  Low back pain/surgery, oophorectomy Sexual abuse: No  BOWEL MOVEMENT Pain with bowel movement: No Type of bowel movement:Frequency almost every day and Strain Yes Stays on colace Fully empty rectum: Yes: - Leakage: No Pads: No - not for fecal incontinence Fiber supplement: No - focuses on fiber rich diet  URINATION Pain with urination: No Fully empty bladder: Yes: strains Stream:  WNL Urgency: Yes: triggers of running water and being in bathroom pulling underwear down Frequency: goes every two hours to prevent more leaking Leakage: Urge to void, Coughing, and Sneezing - gardening biggest issues Pads: Yes: largest pad when gardening - they really bother her and she gets frequent yeast infection  INTERCOURSE Pain with intercourse:  not been sexually active in a long time Ability to have vaginal penetration:  Yes: previously Climax: able to have pain free orgasm on her own Marinoff Scale: 3/3  PREGNANCY Vaginal deliveries 2 Tearing No C-section deliveries 0 Currently pregnant No  PROLAPSE No report of heaviness    OBJECTIVE:   PATIENT SURVEYS:   PFIQ-7 50 (just bladder)  COGNITION:  Overall cognitive status: Within functional limits for tasks assessed     SENSATION:  Light touch: Appears intact  Proprioception: Appears intact GAIT: Comments: Decreased knee/hip extension on Rt LE due to Rt TKA               POSTURE: rounded shoulders, forward head, increased thoracic kyphosis, and posterior pelvic tilt    PALPATION:   General  No abdominal tenderness; diastasis 2 finger widths at  umbilicus with difficulty performing head lift                External Perineal Exam dryness/pallor                             Internal Pelvic Floor mild  dryness noted  Patient confirms identification and approves PT to assess internal pelvic floor and treatment Yes  PELVIC MMT: -Pelvic floor strength 2/5 with significant difficulty with isolation and large amount of contraction being gluteal and adductor -Endurance 3 seconds -Repeat contractions 4x        TONE: WNL  PROLAPSE: Mild Gr 1 anterior vaginal wall laxity  TODAY'S TREATMENT   08/27/2021: Pt brought in bladder dairy and this was reviewed during session to better understand pt's bladder habits/training.   Began implementing pelvic floor contraction with coordination of breathing and exercises below: pt able to feel contractions though reports mild varying degree of how much Ball squeezes in hooklying 2x10 Mini bridges 2x10 Sidelying clams x20 blue loop   08/20/2021: Additional education on urge drill and "just in case" urination as pt had question about this. All answered and pt reported understanding.  Mini bridges x10 Ball squeezes in hooklying 2x10 Sidelying ball press with hip abduction 2x10 each    PATIENT EDUCATION:  Education details: urge drill, HEP Person educated: Patient Education method: Explanation, Demonstration, Tactile cues, Verbal cues, and Handouts Education comprehension: verbalized understanding   HOME EXERCISE PROGRAM: [id number]  ASSESSMENT:  CLINICAL IMPRESSION: Patient noted she has seen improvement this past week with leakage, noting less leakage frequency and smaller amounts when noting it. Pt session then focused on coordination of pelvic floor and breathing mechanics with gentle strengthening exercises for improved mechanics and pelvic floor strength. Pt benefits from mild amount of verbal cues for coordination and decreased breath holding during exercises and for proper technique. Did increase challenge with more added more purposeful contraction of pelvic floor with strengthening exercises and pt able to feel contractions consistently  witht his. She will benefit from skilled PT intervention in order to decrease urinary urgency/incontinence and improve QOL.    OBJECTIVE IMPAIRMENTS Abnormal gait, decreased coordination, decreased endurance, decreased ROM, decreased strength, hypomobility, increased muscle spasms, postural dysfunction, and pain.   ACTIVITY LIMITATIONS continence and gardening  PARTICIPATION LIMITATIONS: community activity  PERSONAL FACTORS 1-2 comorbidities: 2 vaginal deliveries, oophorectomy  are also affecting patient's functional outcome.   REHAB POTENTIAL: Good  CLINICAL DECISION MAKING: Stable/uncomplicated  EVALUATION COMPLEXITY: Low   GOALS: Goals reviewed with patient? Yes  SHORT TERM GOALS: Target date: 08/27/2021  Pt will be independent with HEP.   Baseline: Goal status: INITIAL  2.  Pt will be independent with the knack, urge suppression technique, and double voiding in order to improve bladder habits and decrease urinary incontinence.   Baseline:  Goal status: INITIAL  3.  Pt will be able to completely void urine without bearing down and straining.  Baseline:  Goal status: INITIAL   LONG TERM GOALS: Target date: 09/24/2021   Pt will be independent with advanced HEP.   Baseline:  Goal status: INITIAL  2.  Pt will demonstrate normal pelvic floor muscle tone and A/ROM, able to achieve 4/5 strength with contractions and 10 sec endurance, in order to provide appropriate lumbopelvic support in functional activities.   Baseline:  Goal status: INITIAL  3.  Pt will be able to go 2-3 hours in between voids without urgency or incontinence  in order to improve QOL and perform all functional activities with less difficulty.   Baseline:  Goal status: INITIAL  4.  Pt will report no episodes of urinary incontinence in order to improve confidence in community activities and personal hygiene.   Baseline:  Goal status: INITIAL  5.  Pt will improve PFIQ-7 urinary score to <25.    Baseline: current score 50 Goal status: INITIAL    PLAN: PT FREQUENCY: 1x/week  PT DURATION: 8 weeks  PLANNED INTERVENTIONS: Therapeutic exercises, Therapeutic activity, Neuromuscular re-education, Balance training, Gait training, Patient/Family education, Self Care, Joint mobilization, Dry Needling, Biofeedback, and Manual therapy  PLAN FOR NEXT SESSION: begin core training, urge drill, coordination of pelvic floor with strengthening.   Stacy Gardner, PT, DPT 08/14/234:12 PM

## 2021-08-28 NOTE — Progress Notes (Signed)
GYNECOLOGY  VISIT   HPI: 76 y.o.   Widowed  Caucasian  female   G2P2002 with No LMP recorded. Patient is postmenopausal.   here for follow up medication check.  Tender right breast and abnormal sensation.  She had a fall in early July on her right side.  She had a negative CXR.  She does have a mammogram scheduled.  This tenderness is different from what she experienced earlier this year.   She was started on Myrbetriq for overactive bladder.  It does work well, but it is expensive.  She does not like this aspect of this.  She wishes to use it only when she is traveling.  She may more problems when she is not at home and close to her bathroom.  She is able to void.   Used another medication in the past and developed dry mouth.  She is paying attention to bladder irritants.   She loves the pelvic floor therapy.   She has not leaked when she did some heavy garden work recently.   GYNECOLOGIC HISTORY: No LMP recorded. Patient is postmenopausal. Contraception:  Tubal/PMP Menopausal hormone therapy:  none Last mammogram:  05-18-21 Diag.Rt.Br./Neg/Birads2. 09-22-20 Neg/BiRads1.  Last pap smear:  07-24-21 Neg:Neg HR HPV, 08-14-16 Neg, 06-17-13 Neg        OB History     Gravida  2   Para  2   Term  2   Preterm      AB      Living  2      SAB      IAB      Ectopic      Multiple      Live Births                 Patient Active Problem List   Diagnosis Date Noted   Essential hypertension 04/12/2018   Pain in left foot 01/18/2017   Trigger finger of left hand 01/18/2017   Left knee DJD 01/25/2016   Primary osteoarthritis of left knee 01/24/2012   Decreased libido 11/05/2011   Osteoarthritis of left knee 11/11/2010   Impaired glucose tolerance 11/11/2010   Vitamin D deficiency 11/11/2010   Obesity 07/14/2010   Mitral valve prolapse 07/14/2010   Hyperlipemia 10/01/2007   OBSTRUCTIVE SLEEP APNEA 10/01/2007    Past Medical History:  Diagnosis Date   Arthritis     Genital HSV 03/2013   Positive HSV 1 genital PCR   Heart murmur    Hyperlipidemia    Hypertension    Mitral valve prolapse    since age 46   Mitral valve prolapse    Overactive bladder    Ringing in ears 2010   interfers with clarity of hearing at times   Sleep apnea    pt does not use CPAP   Stress incontinence in female    wears a pad    Past Surgical History:  Procedure Laterality Date   BACK SURGERY     BREAST BIOPSY Left 08/07/2015   benign   BREAST EXCISIONAL BIOPSY Right 01/27/1996   benign   BREAST SURGERY     BENIGN RIGHT BR MASS,Bx's X 2   FOOT SURGERY  2013   CORRECTION OF "HAMMER TOES" -LEFT FOOT   HAND SURGERY     RIGHT THUMB BONE SPUR   JOINT REPLACEMENT     KNEE ARTHROSCOPY  2006, 2010, 2012   LEFT KNEE IN  2010/ RIGHT KNEE IN 2006 AND 2012   KNEE  ARTHROSCOPY  01/24/2012   Procedure: ARTHROSCOPY KNEE;  Surgeon: Johnn Hai, MD;  Location: [facility];  Service: Orthopedics;  Laterality: Left;  left knee arthroscopy with debridment, partial lateral menisectomy   OOPHORECTOMY     RIGHT OVARY REMOVED   SPINE SURGERY     TONSILLECTOMY     TOTAL KNEE ARTHROPLASTY  2012   Right   TOTAL KNEE ARTHROPLASTY Left 01/25/2016   Procedure: LEFT TOTAL KNEE ARTHROPLASTY, EVALUATION UNDER ANESTHESIA AND MANIPULATION UNDER ANESTHESIA;  Surgeon: Susa Day, MD;  Location: [facility];  Service: Orthopedics;  Laterality: Left;   TRIGGER FINGER RELEASE Left 12/25/2017   Procedure: RELEASE TRIGGER FINGER/A-1 PULLEY LEFT MIDDLE FINGER;  Surgeon: Daryll Brod, MD;  Location: [facility];  Service: Orthopedics;  Laterality: Left;   TUBAL LIGATION      Current Outpatient Medications  Medication Sig Dispense Refill   acetaminophen (TYLENOL) 500 MG tablet Take 1,000 mg by mouth every 6 (six) hours as needed (for pain).     ALPHA-LIPOIC ACID PO Take 1 tablet by mouth daily.     cholecalciferol (VITAMIN D) 1000 units tablet Take 2,000 Units by mouth  at bedtime.      Coenzyme Q10 (CO Q 10 PO) Take 1 tablet by mouth daily.     desoximetasone (TOPICORT) 0.25 % cream Apply 1 application topically 2 (two) times daily as needed (for dry skin/itching).      docusate sodium (COLACE) 250 MG capsule Take 250 mg by mouth at bedtime.     DULoxetine (CYMBALTA) 30 MG capsule TAKE 3 CAPSULES(90 MG) BY MOUTH DAILY 90 capsule 3   fluticasone (CUTIVATE) 0.005 % ointment Apply 1 application topically 2 (two) times daily as needed (for dry skin/irritation.).      ketoconazole (NIZORAL) 2 % cream Apply topically daily. 60 g 5   losartan (COZAAR) 50 MG tablet TAKE 1 TABLET(50 MG) BY MOUTH DAILY 90 tablet 3   mirabegron ER (MYRBETRIQ) 25 MG TB24 tablet Take 1 tablet (25 mg total) by mouth daily. 30 tablet 1   Omega-3 Fatty Acids (FISH OIL PO) Take 2 capsules by mouth 2 (two) times daily.      omeprazole (PRILOSEC) 20 MG capsule Take 20 mg by mouth daily.     simvastatin (ZOCOR) 20 MG tablet TAKE 1 TABLET(20 MG) BY MOUTH DAILY 90 tablet 3   triamcinolone cream (KENALOG) 0.1 % Apply 1 application topically 2 (two) times daily. 30 g 0   TURMERIC PO Take 1 tablet by mouth at bedtime.      VOLTAREN 1 % GEL Apply 2 g topically 2 (two) times daily.      Current Facility-Administered Medications  Medication Dose Route Frequency Provider Last Rate Last Admin   clotrimazole-betamethasone (LOTRISONE) cream   Topical BID Elby Showers, MD         ALLERGIES: Sulfa antibiotics, Atenolol, Codeine, and Morphine  Family History  Problem Relation Age of Onset   Hypertension Mother    Heart disease Mother    Kidney failure Mother    Other Mother        sclaraderma   Diabetes Maternal Grandmother        TYPE 2   Hypertension Maternal Grandmother    Other Maternal Grandmother        abdominal anersym   Heart attack Father     Social History   Socioeconomic History   Marital status: Widowed    Spouse name: Not on file  Number of children: 2   Years of  education: Clg, RN   Highest education level: Not on file  Occupational History   Not on file  Tobacco Use   Smoking status: Never   Smokeless tobacco: Never  Vaping Use   Vaping Use: Never used  Substance and Sexual Activity   Alcohol use: Yes    Comment: 1 glass of wine/month   Drug use: No   Sexual activity: Not Currently    Birth control/protection: Post-menopausal    Comment: 1st intercourse 43 yo-2 partners  Other Topics Concern   Not on file  Social History Narrative   Drinks about 2 cups of coffee a day, 1 glass of tea a day    Social Determinants of Radio broadcast assistant Strain: Not on file  Food Insecurity: Not on file  Transportation Needs: Not on file  Physical Activity: Not on file  Stress: Not on file  Social Connections: Not on file  Intimate Partner Violence: Not on file    Review of Systems  See HPI.   PHYSICAL EXAMINATION:    BP 110/78 (BP Location: Right Arm, Patient Position: Sitting)   Pulse 80   Resp 20   SpO2 98%     General appearance: alert, cooperative and appears stated age  Breasts: normal appearance, no masses or tenderness, No nipple retraction or dimpling, No nipple discharge or bleeding, No axillary or supraclavicular adenopathy.  Tender right chest wall, ribs of the inferior chest wall just below the breast.  Tenderness recreated with the palpation of the area today.   Chaperone was present for exam:  Sharee Pimple, RN.  ASSESSMENT  Mixed incontinence.  Doing well on Myrbetriq.  Medication monitoring encounter.   Right rib pain.   PLAN  She will take the Myrbetriq 25 mg as needed for a short period of time and see how she does not taking it daily.  We discussed that she may receive less benefit from this type of approach.  No refill at this time.  She will run out at the end of September and she will let me know if she wishes to continue of discontinue the medicaiton.  I did mention Gemtesa as an alternative, but one which may  have a similar cost.  We discussed avoiding bladder irritants.  She will try Tylenol, heat and rest for her rib pain.  She may proceed with a routine screening mammogram.  FU prn.    An After Visit Summary was printed and given to the patient.  32 min  total time was spent for this patient encounter, including preparation, face-to-face counseling with the patient, coordination of care, and documentation of the encounter.

## 2021-09-03 ENCOUNTER — Encounter: Payer: Self-pay | Admitting: Obstetrics and Gynecology

## 2021-09-03 ENCOUNTER — Ambulatory Visit (INDEPENDENT_AMBULATORY_CARE_PROVIDER_SITE_OTHER): Payer: Medicare Other | Admitting: Obstetrics and Gynecology

## 2021-09-03 VITALS — BP 110/78 | HR 80 | Resp 20

## 2021-09-03 DIAGNOSIS — Z5181 Encounter for therapeutic drug level monitoring: Secondary | ICD-10-CM | POA: Diagnosis not present

## 2021-09-03 DIAGNOSIS — N3946 Mixed incontinence: Secondary | ICD-10-CM | POA: Diagnosis not present

## 2021-09-03 DIAGNOSIS — R0781 Pleurodynia: Secondary | ICD-10-CM

## 2021-09-04 ENCOUNTER — Ambulatory Visit: Payer: Medicare Other | Admitting: Physical Therapy

## 2021-09-04 DIAGNOSIS — M6281 Muscle weakness (generalized): Secondary | ICD-10-CM

## 2021-09-04 DIAGNOSIS — R293 Abnormal posture: Secondary | ICD-10-CM

## 2021-09-04 DIAGNOSIS — R279 Unspecified lack of coordination: Secondary | ICD-10-CM

## 2021-09-04 NOTE — Therapy (Signed)
OUTPATIENT PHYSICAL THERAPY FEMALE PELVIC EVALUATION   Patient Name: [name] MRN: [id number] DOB:05/17/1945, 76 y.o., female Today's Date: 09/04/2021   PT End of Session - 09/04/21 1535     Visit Number 5    Date for PT Re-Evaluation 09/24/21    Authorization Type Medicare    Progress Note Due on Visit 10    PT Start Time 1530    PT Stop Time 1610    PT Time Calculation (min) 40 min    Activity Tolerance Patient tolerated treatment well    Behavior During Therapy [facility] for tasks assessed/performed             Past Medical History:  Diagnosis Date   Arthritis    Genital HSV 03/2013   Positive HSV 1 genital PCR   Heart murmur    Hyperlipidemia    Hypertension    Mitral valve prolapse    since age 45   Mitral valve prolapse    Overactive bladder    Ringing in ears 2010   interfers with clarity of hearing at times   Sleep apnea    pt does not use CPAP   Stress incontinence in female    wears a pad   Past Surgical History:  Procedure Laterality Date   BACK SURGERY     BREAST BIOPSY Left 08/07/2015   benign   BREAST EXCISIONAL BIOPSY Right 01/27/1996   benign   BREAST SURGERY     BENIGN RIGHT BR MASS,Bx's X 2   FOOT SURGERY  2013   CORRECTION OF "HAMMER TOES" -LEFT FOOT   HAND SURGERY     RIGHT THUMB BONE SPUR   JOINT REPLACEMENT     KNEE ARTHROSCOPY  2006, 2010, 2012   LEFT KNEE IN  2010/ RIGHT KNEE IN 2006 AND 2012   KNEE ARTHROSCOPY  01/24/2012   Procedure: ARTHROSCOPY KNEE;  Surgeon: Johnn Hai, MD;  Location: [place];  Service: Orthopedics;  Laterality: Left;  left knee arthroscopy with debridment, partial lateral menisectomy   OOPHORECTOMY     RIGHT OVARY REMOVED   SPINE SURGERY     TONSILLECTOMY     TOTAL KNEE ARTHROPLASTY  2012   Right   TOTAL KNEE ARTHROPLASTY Left 01/25/2016   Procedure: LEFT TOTAL KNEE ARTHROPLASTY, EVALUATION UNDER ANESTHESIA AND MANIPULATION UNDER ANESTHESIA;  Surgeon: Susa Day, MD;   Location: [facility];  Service: Orthopedics;  Laterality: Left;   TRIGGER FINGER RELEASE Left 12/25/2017   Procedure: RELEASE TRIGGER FINGER/A-1 PULLEY LEFT MIDDLE FINGER;  Surgeon: Daryll Brod, MD;  Location: [place];  Service: Orthopedics;  Laterality: Left;   TUBAL LIGATION     Patient Active Problem List   Diagnosis Date Noted   Essential hypertension 04/12/2018   Pain in left foot 01/18/2017   Trigger finger of left hand 01/18/2017   Left knee DJD 01/25/2016   Primary osteoarthritis of left knee 01/24/2012   Decreased libido 11/05/2011   Osteoarthritis of left knee 11/11/2010   Impaired glucose tolerance 11/11/2010   Vitamin D deficiency 11/11/2010   Obesity 07/14/2010   Mitral valve prolapse 07/14/2010   Hyperlipemia 10/01/2007   OBSTRUCTIVE SLEEP APNEA 10/01/2007    PCP: Elby Showers, MD  REFERRING PROVIDER: Nunzio Cobbs, MD  REFERRING DIAG: [phone] (ICD-10-CM) - Mixed incontinence  THERAPY DIAG:  Muscle weakness (generalized)  Abnormal posture  Unspecified lack of coordination  Rationale for Evaluation and Treatment Rehabilitation  ONSET DATE: over 10 years  SUBJECTIVE:                                                                                                                                                                                           SUBJECTIVE STATEMENT: Pt states she started Myrbetriq to see if this helped. Pt reports leakage has been "not bad". Pt reports sleeping through the urge to void bladder causes her a problem and will wake with inability to get to bathroom quickly enough though the amounts  of this are getting smaller. Pt reports she has had a few nights in the last week when she is not getting up during the night and then able to make it to bathroom. Pt does note she feels she is going longer between daytime voids as well and this has been helpful.   Fluid intake: Yes: limits fluid intake to help decrease  leaking - tries to limit tea due to making issues worse     PAIN:  Are you having pain? No   PRECAUTIONS: None  WEIGHT BEARING RESTRICTIONS No  FALLS:  Has patient fallen in last 6 months? No  LIVING ENVIRONMENT: Lives with: lives with their family Lives in: House/apartment   OCCUPATION: Retired  PLOF: Independent  PATIENT GOALS see an improvement in leaking  PERTINENT HISTORY:  Low back pain/surgery, oophorectomy Sexual abuse: No  BOWEL MOVEMENT Pain with bowel movement: No Type of bowel movement:Frequency almost every day and Strain Yes Stays on colace Fully empty rectum: Yes: - Leakage: No Pads: No - not for fecal incontinence Fiber supplement: No - focuses on fiber rich diet  URINATION Pain with urination: No Fully empty bladder: Yes: strains Stream:  WNL Urgency: Yes: triggers of running water and being in bathroom pulling underwear down Frequency: goes every two hours to prevent more leaking Leakage: Urge to void, Coughing, and Sneezing - gardening biggest issues Pads: Yes: largest pad when gardening - they really bother her and she gets frequent yeast infection  INTERCOURSE Pain with intercourse:  not been sexually active in a long time Ability to have vaginal penetration:  Yes: previously Climax: able to have pain free orgasm on her own Marinoff Scale: 3/3  PREGNANCY Vaginal deliveries 2 Tearing No C-section deliveries 0 Currently pregnant No  PROLAPSE No report of heaviness    OBJECTIVE:   PATIENT SURVEYS:   PFIQ-7 50 (just bladder)  COGNITION:  Overall cognitive status: Within functional limits for tasks assessed     SENSATION:  Light touch: Appears intact  Proprioception: Appears intact GAIT: Comments: Decreased knee/hip extension on Rt LE due to Rt TKA  POSTURE: rounded shoulders, forward head, increased thoracic kyphosis, and posterior pelvic tilt    PALPATION:   General  No abdominal tenderness; diastasis 2  finger widths at umbilicus with difficulty performing head lift                External Perineal Exam dryness/pallor                             Internal Pelvic Floor mild dryness noted  Patient confirms identification and approves PT to assess internal pelvic floor and treatment Yes  PELVIC MMT: -Pelvic floor strength 2/5 with significant difficulty with isolation and large amount of contraction being gluteal and adductor -Endurance 3 seconds -Repeat contractions 4x        TONE: WNL  PROLAPSE: Mild Gr 1 anterior vaginal wall laxity  TODAY'S TREATMENT   09/04/21: Opp hand/knee ball press 2x10 each Mini bridges 2x10 Sidelying hip abduction with ball press x20 each Seated hip abduction with blue band x20 X20 Sit to stand from lowest mat setting with exhale to stand, pt is limited 2/2 rt knee pain/decreased ROM chronically     08/27/2021: Pt brought in bladder dairy and this was reviewed during session to better understand pt's bladder habits/training.   Began implementing pelvic floor contraction with coordination of breathing and exercises below: pt able to feel contractions though reports mild varying degree of how much Ball squeezes in hooklying 2x10 Mini bridges 2x10 Sidelying clams x20 blue loop     PATIENT EDUCATION:  Education details: HEP Person educated: Patient Education method: Consulting civil engineer, Media planner, Corporate treasurer cues, Verbal cues, and Handouts Education comprehension: verbalized understanding   HOME EXERCISE PROGRAM: [id number]  ASSESSMENT:  CLINICAL IMPRESSION: Patient noted she has seen improvement with leakage, noting less leakage frequency and smaller amounts when noting it. Pt session then focused on coordination of pelvic floor and breathing mechanics with gentle strengthening exercises for improved mechanics and pelvic floor strength. Pt benefits from mild amount of verbal cues for coordination and decreased breath holding during exercises and for  proper technique. She will benefit from skilled PT intervention in order to decrease urinary urgency/incontinence and improve QOL.    OBJECTIVE IMPAIRMENTS Abnormal gait, decreased coordination, decreased endurance, decreased ROM, decreased strength, hypomobility, increased muscle spasms, postural dysfunction, and pain.   ACTIVITY LIMITATIONS continence and gardening  PARTICIPATION LIMITATIONS: community activity  PERSONAL FACTORS 1-2 comorbidities: 2 vaginal deliveries, oophorectomy  are also affecting patient's functional outcome.   REHAB POTENTIAL: Good  CLINICAL DECISION MAKING: Stable/uncomplicated  EVALUATION COMPLEXITY: Low   GOALS: Goals reviewed with patient? Yes  SHORT TERM GOALS: Target date: 08/27/2021  Pt will be independent with HEP.   Baseline: Goal status: INITIAL  2.  Pt will be independent with the knack, urge suppression technique, and double voiding in order to improve bladder habits and decrease urinary incontinence.   Baseline:  Goal status: INITIAL  3.  Pt will be able to completely void urine without bearing down and straining.  Baseline:  Goal status: INITIAL   LONG TERM GOALS: Target date: 09/24/2021   Pt will be independent with advanced HEP.   Baseline:  Goal status: INITIAL  2.  Pt will demonstrate normal pelvic floor muscle tone and A/ROM, able to achieve 4/5 strength with contractions and 10 sec endurance, in order to provide appropriate lumbopelvic support in functional activities.   Baseline:  Goal status: INITIAL  3.  Pt will be able to go 2-3  hours in between voids without urgency or incontinence in order to improve QOL and perform all functional activities with less difficulty.   Baseline:  Goal status: INITIAL  4.  Pt will report no episodes of urinary incontinence in order to improve confidence in community activities and personal hygiene.   Baseline:  Goal status: INITIAL  5.  Pt will improve PFIQ-7 urinary score to <25.    Baseline: current score 50 Goal status: INITIAL    PLAN: PT FREQUENCY: 1x/week  PT DURATION: 8 weeks  PLANNED INTERVENTIONS: Therapeutic exercises, Therapeutic activity, Neuromuscular re-education, Balance training, Gait training, Patient/Family education, Self Care, Joint mobilization, Dry Needling, Biofeedback, and Manual therapy  PLAN FOR NEXT SESSION: begin core training, urge drill, coordination of pelvic floor with strengthening.   Stacy Gardner, PT, DPT 08/22/234:11 PM

## 2021-09-10 ENCOUNTER — Ambulatory Visit: Payer: Medicare Other | Admitting: Physical Therapy

## 2021-09-10 ENCOUNTER — Telehealth: Payer: Self-pay | Admitting: Physical Therapy

## 2021-09-10 DIAGNOSIS — R293 Abnormal posture: Secondary | ICD-10-CM | POA: Diagnosis not present

## 2021-09-10 DIAGNOSIS — M6281 Muscle weakness (generalized): Secondary | ICD-10-CM | POA: Diagnosis not present

## 2021-09-10 DIAGNOSIS — R279 Unspecified lack of coordination: Secondary | ICD-10-CM | POA: Diagnosis not present

## 2021-09-10 NOTE — Therapy (Signed)
OUTPATIENT PHYSICAL THERAPY FEMALE PELVIC TREATMENT   Patient Name: [name] MRN: [id number] DOB:05/17/1945, 76 y.o., female Today's Date: 09/10/2021   PT End of Session - 09/10/21 1614     Visit Number 6    Date for PT Re-Evaluation 09/24/21    Authorization Type Medicare    Progress Note Due on Visit 10    PT Start Time 1611    PT Stop Time 1027    PT Time Calculation (min) 39 min    Activity Tolerance Patient tolerated treatment well    Behavior During Therapy [facility] for tasks assessed/performed             Past Medical History:  Diagnosis Date   Arthritis    Genital HSV 03/2013   Positive HSV 1 genital PCR   Heart murmur    Hyperlipidemia    Hypertension    Mitral valve prolapse    since age 89   Mitral valve prolapse    Overactive bladder    Ringing in ears 2010   interfers with clarity of hearing at times   Sleep apnea    pt does not use CPAP   Stress incontinence in female    wears a pad   Past Surgical History:  Procedure Laterality Date   BACK SURGERY     BREAST BIOPSY Left 08/07/2015   benign   BREAST EXCISIONAL BIOPSY Right 01/27/1996   benign   BREAST SURGERY     BENIGN RIGHT BR MASS,Bx's X 2   FOOT SURGERY  2013   CORRECTION OF "HAMMER TOES" -LEFT FOOT   HAND SURGERY     RIGHT THUMB BONE SPUR   JOINT REPLACEMENT     KNEE ARTHROSCOPY  2006, 2010, 2012   LEFT KNEE IN  2010/ RIGHT KNEE IN 2006 AND 2012   KNEE ARTHROSCOPY  01/24/2012   Procedure: ARTHROSCOPY KNEE;  Surgeon: Johnn Hai, MD;  Location: [place];  Service: Orthopedics;  Laterality: Left;  left knee arthroscopy with debridment, partial lateral menisectomy   OOPHORECTOMY     RIGHT OVARY REMOVED   SPINE SURGERY     TONSILLECTOMY     TOTAL KNEE ARTHROPLASTY  2012   Right   TOTAL KNEE ARTHROPLASTY Left 01/25/2016   Procedure: LEFT TOTAL KNEE ARTHROPLASTY, EVALUATION UNDER ANESTHESIA AND MANIPULATION UNDER ANESTHESIA;  Surgeon: Susa Day, MD;   Location: [facility];  Service: Orthopedics;  Laterality: Left;   TRIGGER FINGER RELEASE Left 12/25/2017   Procedure: RELEASE TRIGGER FINGER/A-1 PULLEY LEFT MIDDLE FINGER;  Surgeon: Daryll Brod, MD;  Location: [place];  Service: Orthopedics;  Laterality: Left;   TUBAL LIGATION     Patient Active Problem List   Diagnosis Date Noted   Essential hypertension 04/12/2018   Pain in left foot 01/18/2017   Trigger finger of left hand 01/18/2017   Left knee DJD 01/25/2016   Primary osteoarthritis of left knee 01/24/2012   Decreased libido 11/05/2011   Osteoarthritis of left knee 11/11/2010   Impaired glucose tolerance 11/11/2010   Vitamin D deficiency 11/11/2010   Obesity 07/14/2010   Mitral valve prolapse 07/14/2010   Hyperlipemia 10/01/2007   OBSTRUCTIVE SLEEP APNEA 10/01/2007    PCP: Elby Showers, MD  REFERRING PROVIDER: Nunzio Cobbs, MD  REFERRING DIAG: [phone] (ICD-10-CM) - Mixed incontinence  THERAPY DIAG:  Muscle weakness (generalized)  Unspecified lack of coordination  Abnormal posture  Rationale for Evaluation and Treatment Rehabilitation  ONSET DATE: over 10 years  SUBJECTIVE:                                                                                                                                                                                           SUBJECTIVE STATEMENT: Pt has had a very small amount of leakage with heavy lifting with yard work but that is all. Nothing with nighttime but did start Myrbetriq and thinks this has been helping a lot too.    Fluid intake: Yes: limits fluid intake to help decrease leaking - tries to limit tea due to making issues worse    - improving since eval, "I'm not as afraid to drink now".    PAIN:  Are you having pain? No   PRECAUTIONS: None  WEIGHT BEARING RESTRICTIONS No  FALLS:  Has patient fallen in last 6 months? No  LIVING ENVIRONMENT: Lives with: lives with their family Lives  in: House/apartment   OCCUPATION: Retired  PLOF: Independent  PATIENT GOALS see an improvement in leaking  PERTINENT HISTORY:  Low back pain/surgery, oophorectomy Sexual abuse: No  BOWEL MOVEMENT Pain with bowel movement: No Type of bowel movement:Frequency almost every day and Strain Yes Stays on colace Fully empty rectum: Yes: - Leakage: No Pads: No - not for fecal incontinence Fiber supplement: No - focuses on fiber rich diet  URINATION Pain with urination: No Fully empty bladder: Yes: strains Stream:  WNL Urgency: Yes: triggers of running water and being in bathroom pulling underwear down Frequency: goes every two hours to prevent more leaking Leakage: Urge to void, Coughing, and Sneezing - gardening biggest issues Pads: Yes: largest pad when gardening - they really bother her and she gets frequent yeast infection  INTERCOURSE Pain with intercourse:  not been sexually active in a long time Ability to have vaginal penetration:  Yes: previously Climax: able to have pain free orgasm on her own Marinoff Scale: 3/3  PREGNANCY Vaginal deliveries 2 Tearing No C-section deliveries 0 Currently pregnant No  PROLAPSE No report of heaviness    OBJECTIVE:   PATIENT SURVEYS:   PFIQ-7 50 (just bladder)  COGNITION:  Overall cognitive status: Within functional limits for tasks assessed     SENSATION:  Light touch: Appears intact  Proprioception: Appears intact GAIT: Comments: Decreased knee/hip extension on Rt LE due to Rt TKA               POSTURE: rounded shoulders, forward head, increased thoracic kyphosis, and posterior pelvic tilt    PALPATION:   General  No abdominal tenderness; diastasis 2 finger widths at umbilicus with difficulty performing head lift  External Perineal Exam dryness/pallor                             Internal Pelvic Floor mild dryness noted  Patient confirms identification and approves PT to assess internal pelvic floor  and treatment Yes  PELVIC MMT: -Pelvic floor strength 2/5 with significant difficulty with isolation and large amount of contraction being gluteal and adductor -Endurance 3 seconds -Repeat contractions 4x        TONE: WNL  PROLAPSE: Mild Gr 1 anterior vaginal wall laxity  TODAY'S TREATMENT   09/10/2021: NMRE: all exercises cued for breathing mechanics and pelvic floor activation for improved mechanics and strength at core and pelvic floor for decreased leakage. Pt reports she is able to feel contractions as she does activity.  Mini bridges 2x10 Same side hand/knee press with ball 2x10 each X20 pelvic tilts in hooklying  2x10 Sit to stand from lowest mat setting with exhale to stand 2x10 blue band bil horizontal shoulder abduction in sitting 2x10 blue band rows in sitting 2x10 blue band seated marching 2x10 blue band hip abduction   09/04/21: Opp hand/knee ball press 2x10 each Mini bridges 2x10 Sidelying hip abduction with ball press x20 each Seated hip abduction with blue band x20 X20 Sit to stand from lowest mat setting with exhale to stand, pt is limited 2/2 rt knee pain/decreased ROM chronically       PATIENT EDUCATION:  Education details: HEP Person educated: Patient Education method: Consulting civil engineer, Demonstration, Corporate treasurer cues, Verbal cues, and Handouts Education comprehension: verbalized understanding   HOME EXERCISE PROGRAM: [id number]  ASSESSMENT:  CLINICAL IMPRESSION: Patient reports improvement with leakage over the past week, has been taking Myrbetriq as well but only have a "drop" of urine on her pad after heavy yard work and very pleased with this. Pt session then focused on coordination of pelvic floor and breathing mechanics with gentle strengthening exercises for improved mechanics and pelvic floor strength for less leakage. Pt benefits from mild amount of verbal cues for coordination during exercises and for proper technique but has improved. She will  benefit from skilled PT intervention in order to decrease urinary urgency/incontinence and improve QOL.    OBJECTIVE IMPAIRMENTS Abnormal gait, decreased coordination, decreased endurance, decreased ROM, decreased strength, hypomobility, increased muscle spasms, postural dysfunction, and pain.   ACTIVITY LIMITATIONS continence and gardening  PARTICIPATION LIMITATIONS: community activity  PERSONAL FACTORS 1-2 comorbidities: 2 vaginal deliveries, oophorectomy  are also affecting patient's functional outcome.   REHAB POTENTIAL: Good  CLINICAL DECISION MAKING: Stable/uncomplicated  EVALUATION COMPLEXITY: Low   GOALS: Goals reviewed with patient? Yes  SHORT TERM GOALS: Target date: 08/27/2021  Pt will be independent with HEP.   Baseline: Goal status: INITIAL  2.  Pt will be independent with the knack, urge suppression technique, and double voiding in order to improve bladder habits and decrease urinary incontinence.   Baseline:  Goal status: INITIAL  3.  Pt will be able to completely void urine without bearing down and straining.  Baseline:  Goal status: INITIAL   LONG TERM GOALS: Target date: 09/24/2021   Pt will be independent with advanced HEP.   Baseline:  Goal status: INITIAL  2.  Pt will demonstrate normal pelvic floor muscle tone and A/ROM, able to achieve 4/5 strength with contractions and 10 sec endurance, in order to provide appropriate lumbopelvic support in functional activities.   Baseline:  Goal status: INITIAL  3.  Pt will be able  to go 2-3 hours in between voids without urgency or incontinence in order to improve QOL and perform all functional activities with less difficulty.   Baseline:  Goal status: INITIAL  4.  Pt will report no episodes of urinary incontinence in order to improve confidence in community activities and personal hygiene.   Baseline:  Goal status: INITIAL  5.  Pt will improve PFIQ-7 urinary score to <25.   Baseline: current score  50 Goal status: INITIAL    PLAN: PT FREQUENCY: 1x/week  PT DURATION: 8 weeks  PLANNED INTERVENTIONS: Therapeutic exercises, Therapeutic activity, Neuromuscular re-education, Balance training, Gait training, Patient/Family education, Self Care, Joint mobilization, Dry Needling, Biofeedback, and Manual therapy  PLAN FOR NEXT SESSION: begin core training, urge drill, coordination of pelvic floor with strengthening.   Stacy Gardner, PT, DPT 08/28/234:52 PM

## 2021-09-10 NOTE — Telephone Encounter (Signed)
PT spoke to pt rescheduled appointment.   Stacy Gardner, PT, DPT 08/28/233:56 PM

## 2021-09-18 ENCOUNTER — Ambulatory Visit: Payer: Medicare Other | Admitting: Physical Therapy

## 2021-09-21 ENCOUNTER — Ambulatory Visit: Payer: Medicare Other | Attending: Obstetrics and Gynecology | Admitting: Physical Therapy

## 2021-09-21 DIAGNOSIS — R293 Abnormal posture: Secondary | ICD-10-CM | POA: Diagnosis not present

## 2021-09-21 DIAGNOSIS — M6281 Muscle weakness (generalized): Secondary | ICD-10-CM | POA: Insufficient documentation

## 2021-09-21 DIAGNOSIS — R279 Unspecified lack of coordination: Secondary | ICD-10-CM | POA: Diagnosis not present

## 2021-09-21 NOTE — Therapy (Addendum)
OUTPATIENT PHYSICAL THERAPY FEMALE PELVIC TREATMENT   Patient Name: [name] MRN: [id number] DOB:[birth year], 76 y.o., female Today's Date: 09/21/2021   PT End of Session - 09/21/21 1018     Visit Number 7    Date for PT Re-Evaluation 09/24/21    Authorization Type Medicare    Progress Note Due on Visit 10    PT Start Time 1015    PT Stop Time 1100    PT Time Calculation (min) 45 min    Activity Tolerance Patient tolerated treatment well    Behavior During Therapy [facility] for tasks assessed/performed             Past Medical History:  Diagnosis Date   Arthritis    Genital HSV 03/2013   Positive HSV 1 genital PCR   Heart murmur    Hyperlipidemia    Hypertension    Mitral valve prolapse    since age 58   Mitral valve prolapse    Overactive bladder    Ringing in ears 2010   interfers with clarity of hearing at times   Sleep apnea    pt does not use CPAP   Stress incontinence in female    wears a pad   Past Surgical History:  Procedure Laterality Date   BACK SURGERY     BREAST BIOPSY Left 08/07/2015   benign   BREAST EXCISIONAL BIOPSY Right 01/27/1996   benign   BREAST SURGERY     BENIGN RIGHT BR MASS,Bx's X 2   FOOT SURGERY  2013   CORRECTION OF "HAMMER TOES" -LEFT FOOT   HAND SURGERY     RIGHT THUMB BONE SPUR   JOINT REPLACEMENT     KNEE ARTHROSCOPY  2006, 2010, 2012   LEFT KNEE IN  2010/ RIGHT KNEE IN 2006 AND 2012   KNEE ARTHROSCOPY  01/24/2012   Procedure: ARTHROSCOPY KNEE;  Surgeon: Johnn Hai, MD;  Location: [place];  Service: Orthopedics;  Laterality: Left;  left knee arthroscopy with debridment, partial lateral menisectomy   OOPHORECTOMY     RIGHT OVARY REMOVED   SPINE SURGERY     TONSILLECTOMY     TOTAL KNEE ARTHROPLASTY  2012   Right   TOTAL KNEE ARTHROPLASTY Left 01/25/2016   Procedure: LEFT TOTAL KNEE ARTHROPLASTY, EVALUATION UNDER ANESTHESIA AND MANIPULATION UNDER ANESTHESIA;  Surgeon: Susa Day, MD;   Location: [facility];  Service: Orthopedics;  Laterality: Left;   TRIGGER FINGER RELEASE Left 12/25/2017   Procedure: RELEASE TRIGGER FINGER/A-1 PULLEY LEFT MIDDLE FINGER;  Surgeon: Daryll Brod, MD;  Location: [place];  Service: Orthopedics;  Laterality: Left;   TUBAL LIGATION     Patient Active Problem List   Diagnosis Date Noted   Essential hypertension 04/12/2018   Pain in left foot 01/18/2017   Trigger finger of left hand 01/18/2017   Left knee DJD 01/25/2016   Primary osteoarthritis of left knee 01/24/2012   Decreased libido 11/05/2011   Osteoarthritis of left knee 11/11/2010   Impaired glucose tolerance 11/11/2010   Vitamin D deficiency 11/11/2010   Obesity 07/14/2010   Mitral valve prolapse 07/14/2010   Hyperlipemia 10/01/2007   OBSTRUCTIVE SLEEP APNEA 10/01/2007    PCP: Elby Showers, MD  REFERRING PROVIDER: Nunzio Cobbs, MD  REFERRING DIAG: [phone] (ICD-10-CM) - Mixed incontinence  THERAPY DIAG:  Muscle weakness (generalized)  Unspecified lack of coordination  Abnormal posture  Rationale for Evaluation and Treatment Rehabilitation  ONSET DATE: over 10 years  SUBJECTIVE:                                                                                                                                                                                           SUBJECTIVE STATEMENT: Pt reports leakage is about the same as week, pretty minimal but still present. No longer getting up at night to urinate.   Fluid intake: Yes: limits fluid intake to help decrease leaking - tries to limit tea due to making issues worse    - improving since eval, "I'm not as afraid to drink now".    PAIN:  Are you having pain? No   PRECAUTIONS: None  WEIGHT BEARING RESTRICTIONS No  FALLS:  Has patient fallen in last 6 months? No  LIVING ENVIRONMENT: Lives with: lives with their family Lives in: House/apartment   OCCUPATION: Retired  PLOF:  Independent  PATIENT GOALS see an improvement in leaking  PERTINENT HISTORY:  Low back pain/surgery, oophorectomy Sexual abuse: No  BOWEL MOVEMENT Pain with bowel movement: No Type of bowel movement:Frequency almost every day and Strain Yes Stays on colace Fully empty rectum: Yes: - Leakage: No Pads: No - not for fecal incontinence Fiber supplement: No - focuses on fiber rich diet  URINATION Pain with urination: No Fully empty bladder: Yes: strains Stream:  WNL Urgency: Yes: triggers of running water and being in bathroom pulling underwear down Frequency: goes every two hours to prevent more leaking Leakage: Urge to void, Coughing, and Sneezing - gardening biggest issues Pads: Yes: largest pad when gardening - they really bother her and she gets frequent yeast infection  INTERCOURSE Pain with intercourse:  not been sexually active in a long time Ability to have vaginal penetration:  Yes: previously Climax: able to have pain free orgasm on her own Marinoff Scale: 3/3  PREGNANCY Vaginal deliveries 2 Tearing No C-section deliveries 0 Currently pregnant No  PROLAPSE No report of heaviness    OBJECTIVE:   PATIENT SURVEYS:   PFIQ-7 50 (just bladder) 09/21/21 19  COGNITION:  Overall cognitive status: Within functional limits for tasks assessed     SENSATION:  Light touch: Appears intact  Proprioception: Appears intact GAIT: Comments: Decreased knee/hip extension on Rt LE due to Rt TKA               POSTURE: rounded shoulders, forward head, increased thoracic kyphosis, and posterior pelvic tilt    PALPATION:   General  No abdominal tenderness; diastasis 2 finger widths at umbilicus with difficulty performing head lift                External Perineal Exam dryness/pallor  Internal Pelvic Floor mild dryness noted  Patient confirms identification and approves PT to assess internal pelvic floor and treatment Yes  PELVIC  MMT: -Pelvic floor strength 2/5 with significant difficulty with isolation and large amount of contraction being gluteal and adductor -Endurance 3 seconds -Repeat contractions 4x        TONE: WNL  PROLAPSE: Mild Gr 1 anterior vaginal wall laxity  TODAY'S TREATMENT   09/21/21    Mini bridges 2x10 with ball squeeze Same side hand/knee press with ball 2x10 each Hip abduction with ball squeeze x20 each 2x10 Sit to stand from lowest mat setting with exhale to stand no UE support Standing hip flexion, extension, abduction 3# in // x20 each Pt educated on continued HEP and coordination with pelvic floor and breathing for functional tasks.      PATIENT EDUCATION:  Education details: HEP Person educated: Patient Education method: Consulting civil engineer, Media planner, Corporate treasurer cues, Verbal cues, and Handouts Education comprehension: verbalized understanding   HOME EXERCISE PROGRAM: [id number]  ASSESSMENT:  CLINICAL IMPRESSION: Patient reports continued improvement with leakage still minimal.   Pt session focused on coordination of pelvic floor and breathing mechanics with gentle strengthening exercises for improved mechanics and pelvic floor strength for less leakage. Increased challenge today with standing activities and weighted resistance all without leakage. Pt demonstrated improvement with all exercises, pt feels confident in continued improvement with pelvic floor and leakage but would like to check back in, in a few weeks to make sure her progress continues.    OBJECTIVE IMPAIRMENTS Abnormal gait, decreased coordination, decreased endurance, decreased ROM, decreased strength, hypomobility, increased muscle spasms, postural dysfunction, and pain.   ACTIVITY LIMITATIONS continence and gardening  PARTICIPATION LIMITATIONS: community activity  PERSONAL FACTORS 1-2 comorbidities: 2 vaginal deliveries, oophorectomy  are also affecting patient's functional outcome.   REHAB POTENTIAL:  Good  CLINICAL DECISION MAKING: Stable/uncomplicated  EVALUATION COMPLEXITY: Low   GOALS: Goals reviewed with patient? Yes  SHORT TERM GOALS: Target date: 08/27/2021  Pt will be independent with HEP.   Baseline: Goal status: MET  2.  Pt will be independent with the knack, urge suppression technique, and double voiding in order to improve bladder habits and decrease urinary incontinence.   Baseline:  Goal status: MET  3.  Pt will be able to completely void urine without bearing down and straining.  Baseline:  Goal status: MET   LONG TERM GOALS: Target date: 09/24/2021   Pt will be independent with advanced HEP.   Baseline:  Goal status: MET  2.  Pt will demonstrate normal pelvic floor muscle tone and A/ROM, able to achieve 4/5 strength with contractions and 10 sec endurance, in order to provide appropriate lumbopelvic support in functional activities.   Baseline:  Goal status: on going  3.  Pt will be able to go 2-3 hours in between voids without urgency or incontinence in order to improve QOL and perform all functional activities with less difficulty.   Baseline:  Goal status: MET  4.  Pt will report no episodes of urinary incontinence in order to improve confidence in community activities and personal hygiene.   Baseline:  Goal status: on going  5.  Pt will improve PFIQ-7 urinary score to <25.   Baseline: current score 50 Goal status: met     PLAN: PT FREQUENCY: 1x/week  PT DURATION: 8 weeks  PLANNED INTERVENTIONS: Therapeutic exercises, Therapeutic activity, Neuromuscular re-education, Balance training, Gait training, Patient/Family education, Self Care, Joint mobilization, Dry Needling, Biofeedback, and Manual therapy  PLAN  FOR NEXT SESSION: possible DC if pt improvement continues  Stacy Gardner, PT, DPT 09/21/2309:40 AM   PHYSICAL THERAPY DISCHARGE SUMMARY  Visits from Start of Care: 7  Current functional level related to goals / functional  outcomes: Per last note when pt last seen, symptoms improving and no longer getting up at night to urinate.    Remaining deficits: Unable to formally reassess   Education / Equipment: HEP   Patient agrees to discharge. Patient goals were partially met. Patient is being discharged due to not returning since the last visit.   Stacy Gardner, PT, DPT 10/03/239:56 AM

## 2021-09-27 ENCOUNTER — Ambulatory Visit: Payer: Medicare Other

## 2021-10-05 ENCOUNTER — Other Ambulatory Visit: Payer: Self-pay | Admitting: Obstetrics and Gynecology

## 2021-10-05 NOTE — Telephone Encounter (Signed)
Last annual exam was 7/23

## 2021-10-08 NOTE — Telephone Encounter (Signed)
Please contact patient and see how she is doing on this medication.   She was going to start taking it more regularly and let me know if it is working well for her.

## 2021-10-09 ENCOUNTER — Telehealth (INDEPENDENT_AMBULATORY_CARE_PROVIDER_SITE_OTHER): Payer: Medicare Other | Admitting: Internal Medicine

## 2021-10-09 ENCOUNTER — Encounter: Payer: Self-pay | Admitting: Internal Medicine

## 2021-10-09 ENCOUNTER — Telehealth: Payer: Self-pay | Admitting: Internal Medicine

## 2021-10-09 VITALS — HR 83 | Temp 99.6°F | Wt 210.0 lb

## 2021-10-09 DIAGNOSIS — U071 COVID-19: Secondary | ICD-10-CM

## 2021-10-09 MED ORDER — HYDROCODONE BIT-HOMATROP MBR 5-1.5 MG/5ML PO SOLN: 5.0000 mL | Freq: Three times a day (TID) | ORAL | 0 refills | Status: DC | PRN

## 2021-10-09 MED ORDER — NIRMATRELVIR/RITONAVIR (PAXLOVID)TABLET: 3.0000 | ORAL_TABLET | Freq: Two times a day (BID) | ORAL | 0 refills | Status: DC

## 2021-10-09 NOTE — Patient Instructions (Signed)
Patient has been prescribed Paxlovid regular strength.  Her renal functions are normal.  Also prescribed Hycodan 1 teaspoon every 8 hours as needed for cough and sore throat pain.  She is to stay well-hydrated.  Is to monitor pulse oximetry and walk around her home some to prevent atelectasis.  Needs to quarantine for 5 days and call if symptoms worsen.

## 2021-10-09 NOTE — Telephone Encounter (Signed)
[name] [phone]  [name] called to say she tested positive for COVID yesterday, she started feeling sick with fever 101, coughing up phlegm, sore throat, body aches, ear ache, chills, headache, runny nose. She said all of this started yesterday and she was un able to sleep. I set her up for video visit at 12:00. She can get her vitals and have ready for visit.

## 2021-10-09 NOTE — Progress Notes (Signed)
   Subjective:    Patient ID: [name], female    DOB: November 26, 1945, 76 y.o.   MRN: [id number]  HPI  76 year old Female seen today by interactive audio and video telecommunications due to the coronavirus pandemic.  She is identified using 2 identifiers as [name]. [name], a longstanding patient in this practice.  She is agreeable to visit in this format today.  She is at her home and I am at my office.  Patient tells me that yesterday afternoon she felt fatigued and lethargic.  Began to cough and noted some discolored sputum production.  Has had headache, ear pain, hoarseness and irritated throat..  I records indicate she has had 3 COVID vaccines and the last one on file is February 2021.  Had issues sleeping last night because of all of the symptoms.  Tested positive for COVID yesterday.  Maximum temperature was 101 degrees.  [name] birthday and she was disappointed that she was ill.    Review of Systems see above-no vomiting or diarrhea     Objective:   Physical Exam She is seen virtually in no acute distress and is able to give a clear concise history.  She is not wheezing.  She is not tachypneic.  Color is slightly pale.  She looks fatigued.       Assessment & Plan:  Acute COVID-19 virus infection  Plan: She cannot be revaccinated for 2 to 3 months.  Suggest she get revaccinated at that time with new COVID booster.  She also needs annual flu vaccine this fall as well.  Consider pneumococcal 20 vaccine and RSV vaccine as well.  She has normal renal functions and she was E scribed Paxlovid regular strength today.  Also prescribed Hycodan 1 teaspoon every 8 hours as needed for cough and sore throat pain.  Stay well-hydrated.  Walk around the house some to prevent atelectasis and monitor pulse oximetry.  Call if symptoms worsen.  Time spent reviewing chart, interviewing patient, medical decision making and in scribing medications is 20 minutes

## 2021-10-11 NOTE — Telephone Encounter (Signed)
Called patient and read what Dr Renold Genta had said, she verbalized understanding.

## 2021-10-11 NOTE — Telephone Encounter (Signed)
[name] called to ask if it is normal for her pulse to run in the 50's, she stated her O2 is fine and her blood pressure is fine, She is getting better, just concerned about the pulse rate being low. She ask could it be the medicine?

## 2021-10-11 NOTE — Telephone Encounter (Signed)
Patient reports she told the pharmacy requested this Rx. Patient said she doesn't need refill, only takes PRN when she takes trips. Patient said she appears to do well taking PRN. She reports the medication made her feel tired when taken daily.

## 2021-10-12 ENCOUNTER — Other Ambulatory Visit: Payer: Self-pay | Admitting: Internal Medicine

## 2021-10-15 ENCOUNTER — Telehealth (INDEPENDENT_AMBULATORY_CARE_PROVIDER_SITE_OTHER): Payer: Medicare Other | Admitting: Internal Medicine

## 2021-10-15 ENCOUNTER — Encounter: Payer: Self-pay | Admitting: Internal Medicine

## 2021-10-15 VITALS — BP 132/84 | HR 66 | Temp 98.0°F

## 2021-10-15 DIAGNOSIS — N390 Urinary tract infection, site not specified: Secondary | ICD-10-CM | POA: Diagnosis not present

## 2021-10-15 MED ORDER — NITROFURANTOIN MONOHYD MACRO 100 MG PO CAPS: 100.0000 mg | ORAL_CAPSULE | Freq: Two times a day (BID) | ORAL | 1 refills | Status: DC

## 2021-10-15 NOTE — Patient Instructions (Signed)
Take Macrobid 100 mg twice daily for 7 days.  Call if not better in 24 to 48 hours or sooner if worse.  May take AZO standard over-the-counter for dysuria.  Stay well-hydrated.

## 2021-10-15 NOTE — Telephone Encounter (Signed)
[name] has called this morning with urgency, frequency and burning. Since she has COVID that started last Monday what do we need to do?

## 2021-10-15 NOTE — Telephone Encounter (Signed)
Scheduled

## 2021-10-15 NOTE — Progress Notes (Signed)
   Subjective:    Patient ID: [name], female    DOB: 1945-06-19, 76 y.o.   MRN: [id number]  HPI 76 year old Female seen today by interactive audio and video telecommunications.  She was recently diagnosed with COVID-19 via virtual visit on September 26 and has been on Paxlovid.  She is identified using 2 identifiers as [name]. Wilbert, a longstanding patient in this practice.  She is in her home and I am at my office.  Today, she called complaining of urinary frequency and dysuria onset on Saturday, September 30.   Since having COVID, she has a productive cough.  She has finished 5 days of Paxlovid which was prescribed on September 26.  She does not have a history of recurrent urinary infections.  Last one was in August 2020 treated by Dr. Phineas Real with Macrobid and culture grew 1-10,000 colonies per milliliter gram-positive flora.  She has no hematuria.  Main complaint is urgency, frequency and dysuria.  No fever or shaking chills.  No nausea or vomiting.  She has a history of hyperlipidemia and impaired glucose tolerance.  Also has GE reflux and osteoarthritis.  Sulfa causes hives.  History of several orthopedic surgeries.  Social history: She is a widow and resides alone.  Does not smoke.   Review of Systems see above no vomiting or diarrhea.  Did well with Paxlovid.  Denies back pain.     Objective:   Physical Exam  She is seen virtually and looks a bit fatigued but is in no acute distress.      Assessment & Plan:   Acute urinary tract infection  Plan: Since Macrobid worked well for her in 2020 I am represcribing this 100 mg twice daily for 7 days.  She will call if not improving in 24 to 48 hours or sooner if worse.  May also take AZO standard if needed for dysuria  Time spent with this visit including chart review, seeing patient and medical decision making is 15 minutes

## 2021-10-17 ENCOUNTER — Ambulatory Visit: Payer: Medicare Other

## 2021-11-09 ENCOUNTER — Ambulatory Visit
Admission: RE | Admit: 2021-11-09 | Discharge: 2021-11-09 | Disposition: A | Discharge status: Home / Self Care | Payer: Medicare Other | Source: Ambulatory Visit | Attending: Obstetrics and Gynecology | Admitting: Obstetrics and Gynecology

## 2021-11-09 DIAGNOSIS — Z1231 Encounter for screening mammogram for malignant neoplasm of breast: Secondary | ICD-10-CM | POA: Diagnosis not present

## 2021-11-18 DIAGNOSIS — N390 Urinary tract infection, site not specified: Secondary | ICD-10-CM | POA: Diagnosis not present

## 2021-11-30 ENCOUNTER — Other Ambulatory Visit: Payer: Self-pay | Admitting: Internal Medicine

## 2021-11-30 MED ORDER — DULOXETINE HCL 30 MG PO CPEP: ORAL_CAPSULE | ORAL | 3 refills | Status: DC

## 2021-11-30 NOTE — Telephone Encounter (Signed)
[name] [phone]  [name] called to say her pharmacy said we had not responded to a request to refill below medication. let her know we had not received any request, that is why we have not responded.  DULoxetine (CYMBALTA) 30 MG capsule   [id number] - [place], [place] [address] Phone: [phone]  Fax: [phone]

## 2021-11-30 NOTE — Telephone Encounter (Signed)
Called patient to let her know RX has been sent to pharmacy

## 2021-12-10 ENCOUNTER — Other Ambulatory Visit: Payer: Medicare Other

## 2021-12-10 DIAGNOSIS — E7800 Pure hypercholesterolemia, unspecified: Secondary | ICD-10-CM

## 2021-12-10 DIAGNOSIS — R7302 Impaired glucose tolerance (oral): Secondary | ICD-10-CM

## 2021-12-10 DIAGNOSIS — R5383 Other fatigue: Secondary | ICD-10-CM

## 2021-12-10 DIAGNOSIS — I10 Essential (primary) hypertension: Secondary | ICD-10-CM

## 2021-12-11 ENCOUNTER — Other Ambulatory Visit: Payer: Self-pay

## 2021-12-11 LAB — CBC WITH DIFFERENTIAL/PLATELET
Absolute Monocytes: 317 cells/uL (ref 200–950)
Basophils Absolute: 41 cells/uL (ref 0–200)
Basophils Relative: 0.9 %
Eosinophils Absolute: 179 cells/uL (ref 15–500)
Eosinophils Relative: 3.9 %
HCT: 43.5 % (ref 35.0–45.0)
Hemoglobin: 14.5 g/dL (ref 11.7–15.5)
Lymphs Abs: 1251 cells/uL (ref 850–3900)
MCH: 30.7 pg (ref 27.0–33.0)
MCHC: 33.3 g/dL (ref 32.0–36.0)
MCV: 92 fL (ref 80.0–100.0)
MPV: 10.3 fL (ref 7.5–12.5)
Monocytes Relative: 6.9 %
Neutro Abs: 2811 cells/uL (ref 1500–7800)
Neutrophils Relative %: 61.1 %
Platelets: 215 10*3/uL (ref 140–400)
RBC: 4.73 10*6/uL (ref 3.80–5.10)
RDW: 12.4 % (ref 11.0–15.0)
Total Lymphocyte: 27.2 %
WBC: 4.6 10*3/uL (ref 3.8–10.8)

## 2021-12-11 LAB — COMPLETE METABOLIC PANEL WITH GFR
AG Ratio: 1.7 (calc) (ref 1.0–2.5)
ALT: 20 U/L (ref 6–29)
AST: 18 U/L (ref 10–35)
Albumin: 4.3 g/dL (ref 3.6–5.1)
Alkaline phosphatase (APISO): 58 U/L (ref 37–153)
BUN: 22 mg/dL (ref 7–25)
CO2: 26 mmol/L (ref 20–32)
Calcium: 9.2 mg/dL (ref 8.6–10.4)
Chloride: 108 mmol/L (ref 98–110)
Creat: 0.94 mg/dL (ref 0.60–1.00)
Globulin: 2.5 g/dL (calc) (ref 1.9–3.7)
Glucose, Bld: 100 mg/dL — ABNORMAL HIGH (ref 65–99)
Potassium: 4.8 mmol/L (ref 3.5–5.3)
Sodium: 142 mmol/L (ref 135–146)
Total Bilirubin: 0.5 mg/dL (ref 0.2–1.2)
Total Protein: 6.8 g/dL (ref 6.1–8.1)
eGFR: 62 mL/min/{1.73_m2} (ref >=60)

## 2021-12-11 LAB — MICROALBUMIN / CREATININE URINE RATIO
Creatinine, Urine: 135 mg/dL (ref 20–275)
Microalb Creat Ratio: 6 mcg/mg creat (ref <30)
Microalb, Ur: 0.8 mg/dL

## 2021-12-11 LAB — HEMOGLOBIN A1C
Hgb A1c MFr Bld: 5.7 % of total Hgb — ABNORMAL HIGH (ref <5.7)
Mean Plasma Glucose: 117 mg/dL
eAG (mmol/L): 6.5 mmol/L

## 2021-12-11 LAB — LIPID PANEL
Cholesterol: 206 mg/dL — ABNORMAL HIGH (ref <200)
HDL: 68 mg/dL (ref >=50)
LDL Cholesterol (Calc): 119 mg/dL (calc) — ABNORMAL HIGH
Non-HDL Cholesterol (Calc): 138 mg/dL (calc) — ABNORMAL HIGH (ref <130)
Total CHOL/HDL Ratio: 3 (calc) (ref <5.0)
Triglycerides: 89 mg/dL (ref <150)

## 2021-12-11 LAB — TSH: TSH: 2.54 mIU/L (ref 0.40–4.50)

## 2021-12-11 MED ORDER — LOSARTAN POTASSIUM 50 MG PO TABS: ORAL_TABLET | ORAL | 3 refills | Status: DC

## 2021-12-13 ENCOUNTER — Ambulatory Visit (INDEPENDENT_AMBULATORY_CARE_PROVIDER_SITE_OTHER): Payer: Medicare Other | Admitting: Internal Medicine

## 2021-12-13 ENCOUNTER — Encounter: Payer: Self-pay | Admitting: Internal Medicine

## 2021-12-13 VITALS — BP 124/78 | HR 81 | Temp 98.5°F | Ht 64.0 in | Wt 208.8 lb

## 2021-12-13 DIAGNOSIS — G4733 Obstructive sleep apnea (adult) (pediatric): Secondary | ICD-10-CM | POA: Diagnosis not present

## 2021-12-13 DIAGNOSIS — I10 Essential (primary) hypertension: Secondary | ICD-10-CM | POA: Diagnosis not present

## 2021-12-13 DIAGNOSIS — Z0000 Encounter for general adult medical examination without abnormal findings: Secondary | ICD-10-CM | POA: Diagnosis not present

## 2021-12-13 DIAGNOSIS — N3946 Mixed incontinence: Secondary | ICD-10-CM

## 2021-12-13 DIAGNOSIS — E7800 Pure hypercholesterolemia, unspecified: Secondary | ICD-10-CM

## 2021-12-13 DIAGNOSIS — K2100 Gastro-esophageal reflux disease with esophagitis, without bleeding: Secondary | ICD-10-CM | POA: Diagnosis not present

## 2021-12-13 DIAGNOSIS — Z96653 Presence of artificial knee joint, bilateral: Secondary | ICD-10-CM

## 2021-12-13 DIAGNOSIS — Z6835 Body mass index (BMI) 35.0-35.9, adult: Secondary | ICD-10-CM | POA: Diagnosis not present

## 2021-12-13 DIAGNOSIS — R7302 Impaired glucose tolerance (oral): Secondary | ICD-10-CM | POA: Diagnosis not present

## 2021-12-13 LAB — POCT URINALYSIS DIPSTICK
Bilirubin, UA: NEGATIVE
Blood, UA: NEGATIVE
Glucose, UA: NEGATIVE
Ketones, UA: NEGATIVE
Leukocytes, UA: NEGATIVE
Nitrite, UA: NEGATIVE
Protein, UA: NEGATIVE
Spec Grav, UA: 1.015 (ref 1.010–1.025)
Urobilinogen, UA: 0.2 E.U./dL
pH, UA: 5 (ref 5.0–8.0)

## 2021-12-13 MED ORDER — NYSTATIN-TRIAMCINOLONE 100000-0.1 UNIT/GM-% EX CREA: 1.0000 | TOPICAL_CREAM | Freq: Two times a day (BID) | CUTANEOUS | 0 refills | Status: DC

## 2021-12-13 NOTE — Patient Instructions (Signed)
It was a pleasure to see you today.  Please work on diet, exercise and weight loss.  Vaccines reviewed and discussed.  Recommendations made.  Continue current medications.  Mycolog cream prescribed for vaginal irritation to use sparingly as needed.  Colonoscopy is up-to-date.

## 2021-12-13 NOTE — Progress Notes (Signed)
Annual Wellness Visit     Patient: [name], Female    DOB: 1945/03/01, 76 y.o.   MRN: [id number] Visit Date: 12/13/2021   Subjective    [name] is a 76 y.o. female who presents today for her Annual Wellness Visit.  HPI Also seen for health maintenance exam. Had UTI first weekend in November. U/A today obtained and is normal. Her LDL level is elevated after Thanksgiving dinner at 119.  Patient advised to watch diet. Has been eating lots of rich foods.  She has taken Cymbalta for many years for back pain.  She has had epidural steroid injections in the past.  Sometimes has been given a short course of prednisone.  [place] [employer] work is made symptoms worse.  She had an L4-L5 microdiscectomy in October 2004.  Currently back pain seems to be stable.  History of hyperlipidemia and impaired glucose tolerance, GE reflux, obesity and osteoarthritis.  She has history of trace mitral regurgitation and mitral valve prolapse diagnosed in 1983 on echocardiogram.  Had avulsion of the right great toenail June 2018 and avulsion of the tip of the index finger pruning shrubbery August 2019 seen and treated by hand surgeon.  Tetanus immunization was updated at that time.  History of right total knee arthroplasty February 2012.  Left knee arthroscopic surgery 2014.  Left knee arthroscopic surgery 2010.  Another left knee arthroscopic surgery October 2011.  Surgery for torn meniscus right knee 2006.  Had total left knee arthroplasty January 2018.  Had benign right breast biopsy 1997.  Tonsillectomy and adenoidectomy at age 89.  Bilateral tubal ligation at age 37.  Right oophorectomy with D&C and uterine polypectomy in 2002.  Sulfa causes hives.  Social history: She is a widow.  Does not smoke.  Social alcohol consumption.  2 adult daughters.  Several grandchildren.  Her elderly female friend that she used to spend time with passed away not long ago.  Family history: Parents divorced when she  was 1 years old.  She does not know her father's family history.  Mother died at age 21 of heart failure, kidney failure and scleroderma.  Patient is an only child.  Paternal grandmother died at age 72 of abdominal aortic aneurysm rupture.  Will be having some landscaping done soon and she is excited about that.  Vaccines reviewed and recommendations made.  Had colonoscopy in 2017 with 10-year follow-up given.        Review of Systems see above-no new complaints   Objective    Vitals: Vital signs reviewed.  Blood pressure stable at 124/78, pulse 81 regular, temperature 98.5 degrees, pulse oximetry 95%, weight 208 pounds 12.8 ounces, BMI 35.84, height 5 feet 4 inches  Physical Exam Skin: Warm and dry.  Nodes none.  TMs clear.  Neck supple.  No thyromegaly or carotid bruits.  Chest clear.  Cardiac exam: Regular rate and rhythm.  Breast are without masses.  Abdomen is soft nondistended without hepatosplenomegaly masses or tenderness.  GYN exam deferred to Dr. Quincy Simmonds.  Neurological exam is intact without gross focal deficits on brief neurological exam.  Most recent fall risk assessment:    10/15/2021   12:42 PM  [place] in the past year? 1  Number falls in past yr: 0  Injury with Fall? 0  Risk for fall due to : No Fall Risks  Follow up Falls evaluation completed    Most recent depression screenings:    10/15/2021  12:42 PM 10/09/2021   12:09 PM  PHQ 2/9 Scores  PHQ - 2 Score 0 0   Most recent cognitive screening: Brief cognition screen is normal      Assessment & Plan   Pure hypercholesterolemia: Total cholesterol is 206 and LDL is 119.  She is on simvastatin 20 mg daily.  I think we can continue same dose and asked her to cut back on rich foods.  Urge incontinence treated with Myrbetriq and that has helped quite a bit.  This was prescribed by Dr. Quincy Simmonds  Chronic back pain treated with Cymbalta and stable.  Aggravated by yard work.  BMI 35.84-not much change  in weight since last year.  Continue diet and exercise efforts.  Needs to lose some weight.  Vaginal irritation-have prescribed Mycolog cream.  Healthcare maintenance: Vaccines discussed and recommendations made.  Essential hypertension-stable on losartan.  Continue to monitor at home.  Plan: Return in 1 year or as needed.  Vaccines needed are reviewed with her today.  Watch diet and try to lose some weight.  Get regular daily exercise.         Annual wellness visit done today including the all of the following: Reviewed patient's Family Medical History Reviewed and updated list of patient's medical providers Assessment of cognitive impairment was done Assessed patient's functional ability Established a written schedule for health screening [place] Completed and Reviewed  Discussed health benefits of physical activity, and encouraged her to engage in regular exercise appropriate for her age and condition.         IElby Showers, MD, have reviewed all documentation for this visit. The documentation on 12/13/21 for the exam, diagnosis, procedures, and orders are all accurate and complete.   LaVon Barron Alvine, CMA

## 2021-12-25 DIAGNOSIS — M7061 Trochanteric bursitis, right hip: Secondary | ICD-10-CM | POA: Diagnosis not present

## 2021-12-25 DIAGNOSIS — M1611 Unilateral primary osteoarthritis, right hip: Secondary | ICD-10-CM | POA: Diagnosis not present

## 2021-12-27 DIAGNOSIS — Z23 Encounter for immunization: Secondary | ICD-10-CM | POA: Diagnosis not present

## 2021-12-31 DIAGNOSIS — Z23 Encounter for immunization: Secondary | ICD-10-CM | POA: Diagnosis not present

## 2022-01-01 DIAGNOSIS — H26493 Other secondary cataract, bilateral: Secondary | ICD-10-CM | POA: Diagnosis not present

## 2022-01-01 DIAGNOSIS — Z961 Presence of intraocular lens: Secondary | ICD-10-CM | POA: Diagnosis not present

## 2022-01-23 DIAGNOSIS — M25551 Pain in right hip: Secondary | ICD-10-CM | POA: Diagnosis not present

## 2022-02-05 DIAGNOSIS — M1611 Unilateral primary osteoarthritis, right hip: Secondary | ICD-10-CM | POA: Diagnosis not present

## 2022-04-02 ENCOUNTER — Other Ambulatory Visit: Payer: Self-pay | Admitting: Internal Medicine

## 2022-05-08 DIAGNOSIS — M25551 Pain in right hip: Secondary | ICD-10-CM | POA: Diagnosis not present

## 2022-06-19 ENCOUNTER — Other Ambulatory Visit: Payer: Self-pay | Admitting: Internal Medicine

## 2022-07-23 DIAGNOSIS — H26493 Other secondary cataract, bilateral: Secondary | ICD-10-CM | POA: Diagnosis not present

## 2022-07-23 DIAGNOSIS — H52203 Unspecified astigmatism, bilateral: Secondary | ICD-10-CM | POA: Diagnosis not present

## 2022-07-24 ENCOUNTER — Other Ambulatory Visit: Payer: Self-pay | Admitting: Internal Medicine

## 2022-08-14 DIAGNOSIS — M25551 Pain in right hip: Secondary | ICD-10-CM | POA: Diagnosis not present

## 2022-09-22 ENCOUNTER — Other Ambulatory Visit: Payer: Self-pay | Admitting: Internal Medicine

## 2022-09-22 IMAGING — CR DG FOOT COMPLETE 3+V*L*
3 series · 3 of 3 positions shown · non-contrast
Comparison: None.

CLINICAL DATA: Atraumatic left foot pain x1 week.

EXAM:
LEFT FOOT - COMPLETE 3+ VIEW

[x foot ap left]
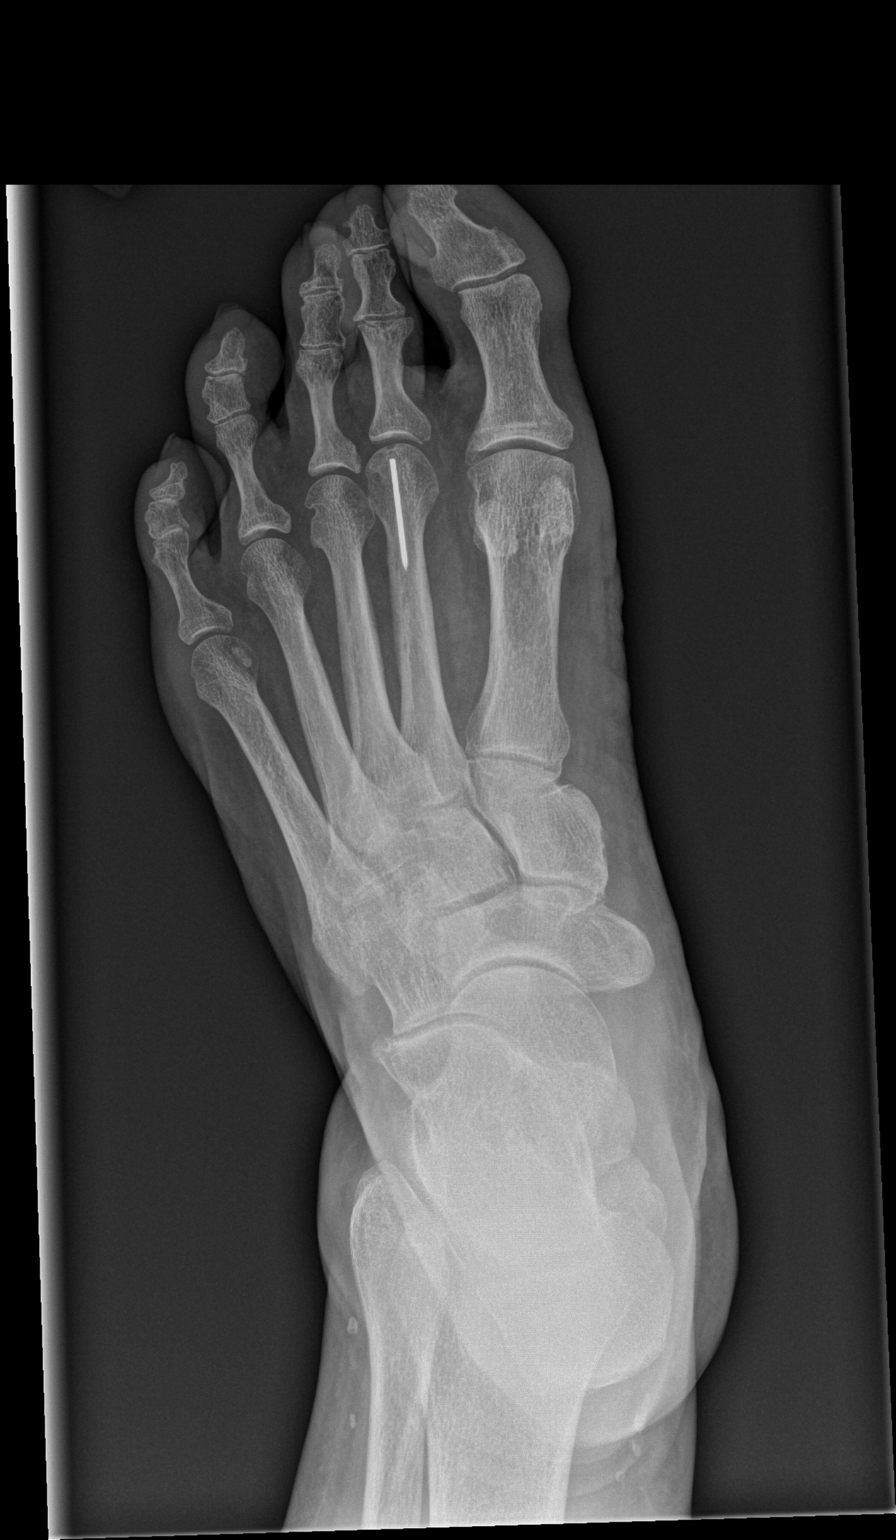

[x foot obl left]
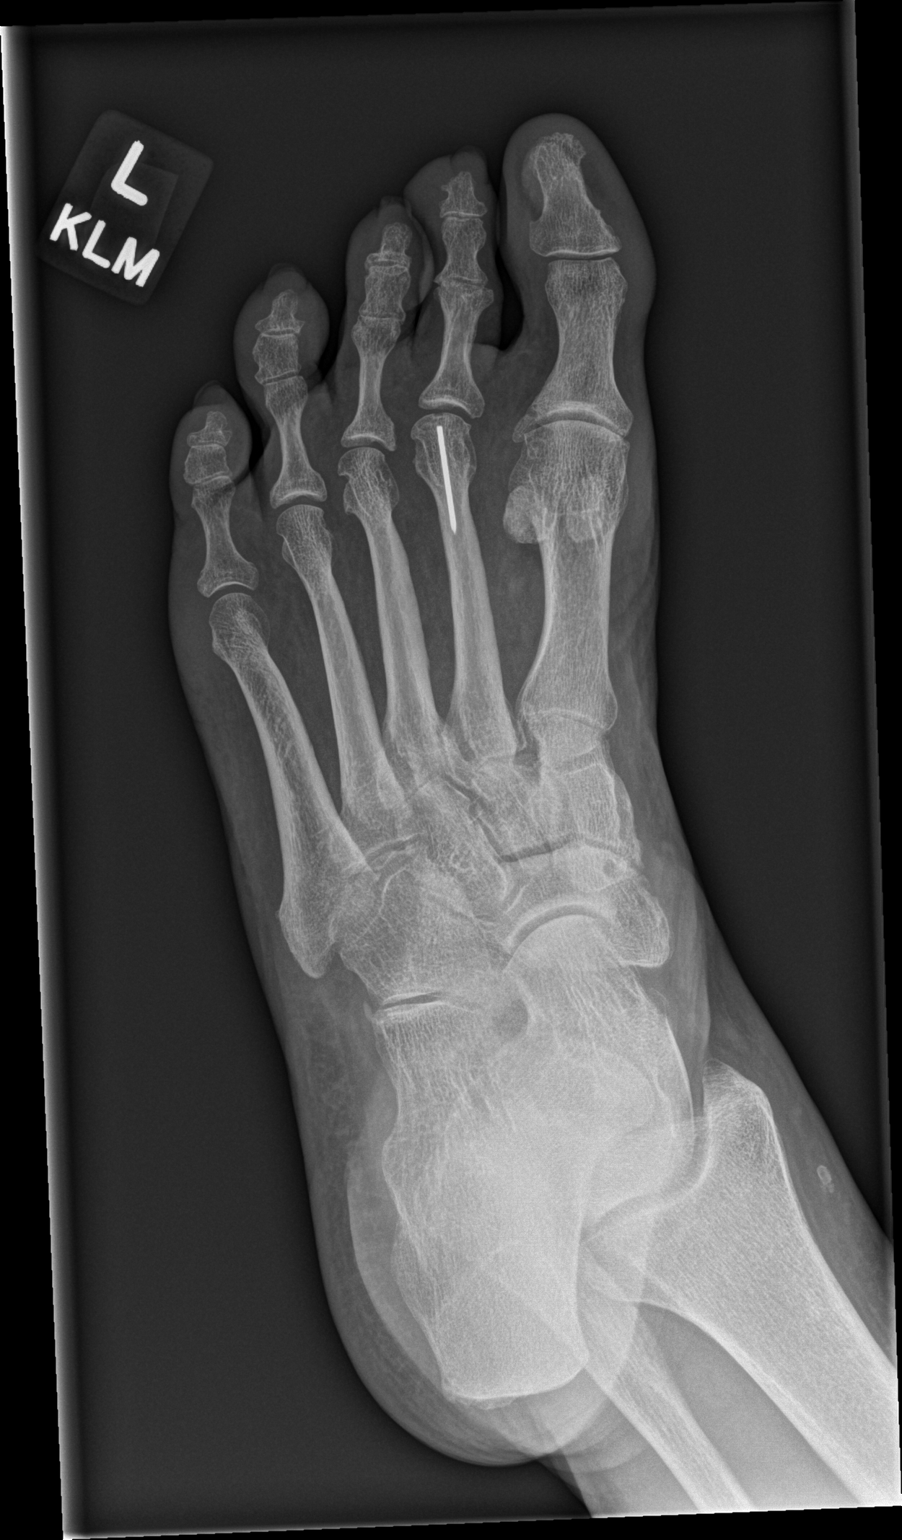

[x foot lat left]
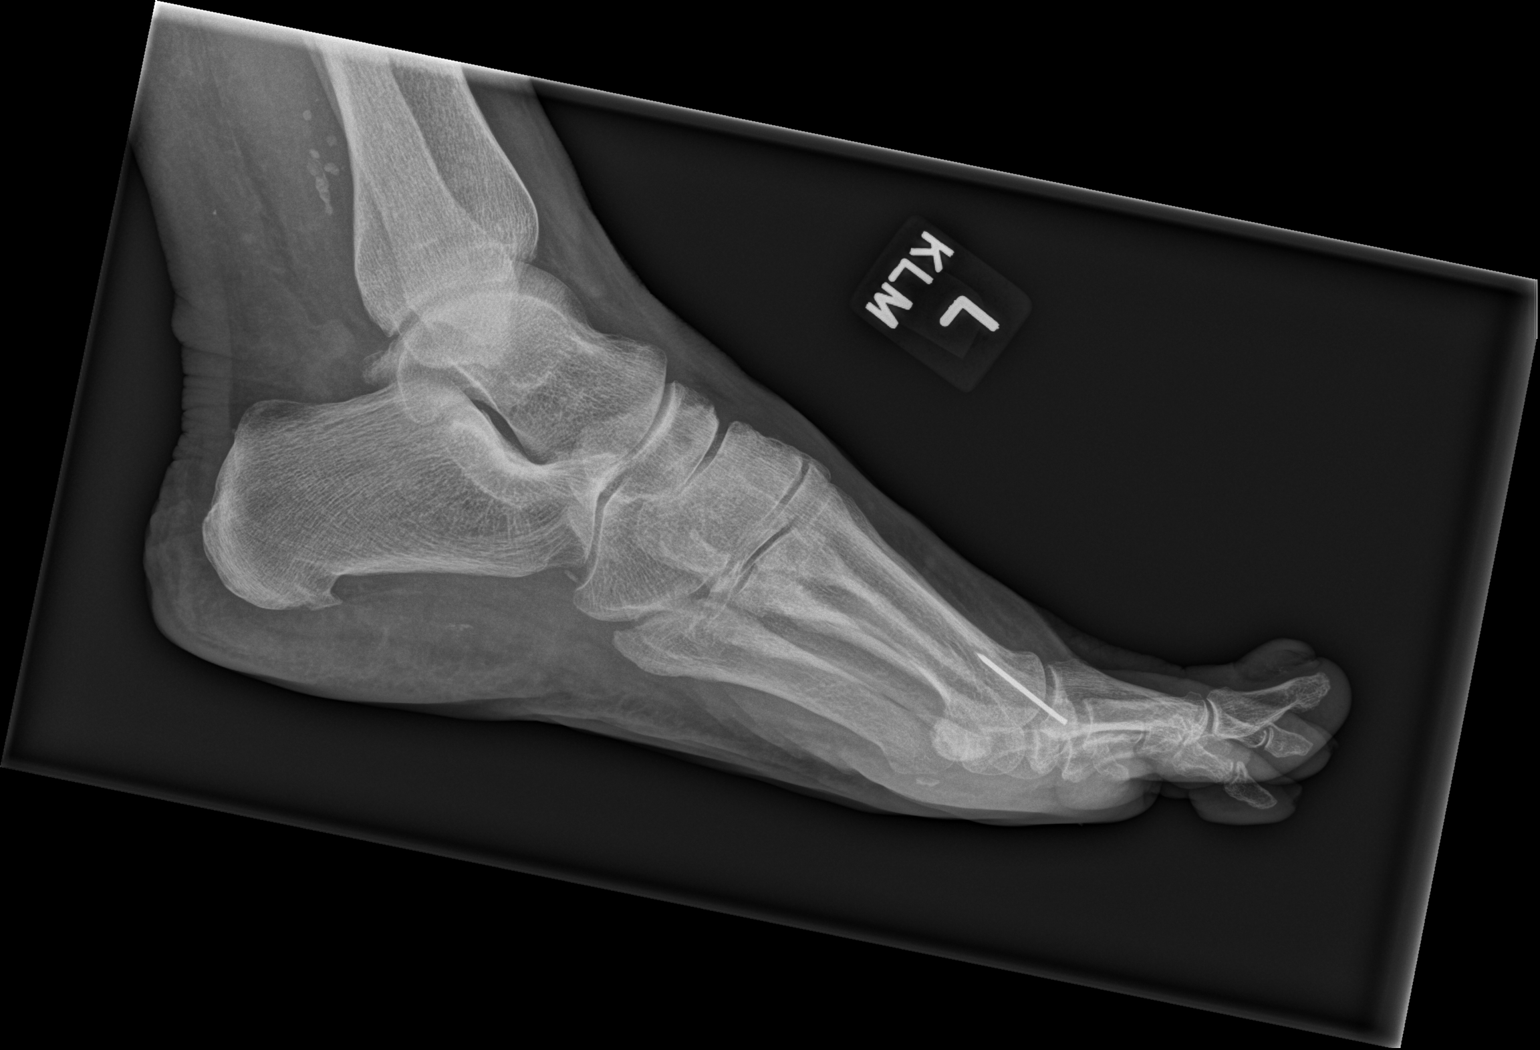

[3 of 3 positions shown; findings below may reference images not displayed]

FINDINGS: There is no evidence of fracture or dislocation. A radiopaque
surgical pin is seen within the distal aspect of the second left
metatarsal. A small plantar calcaneal spur is seen. There is no
evidence of arthropathy or other focal bone abnormality. Mild soft
tissue swelling is seen along the dorsal aspect of the distal left
foot.
IMPRESSION: 1. Mild dorsal soft tissue swelling without an acute osseous
abnormality.
2. Surgical hardware within the distal aspect of the second left
metatarsal.
3. Small plantar calcaneal spur.

## 2022-09-28 DIAGNOSIS — Z23 Encounter for immunization: Secondary | ICD-10-CM | POA: Diagnosis not present

## 2022-10-04 DIAGNOSIS — Z23 Encounter for immunization: Secondary | ICD-10-CM | POA: Diagnosis not present

## 2022-10-10 ENCOUNTER — Other Ambulatory Visit: Payer: Self-pay | Admitting: Internal Medicine

## 2022-10-10 DIAGNOSIS — Z1231 Encounter for screening mammogram for malignant neoplasm of breast: Secondary | ICD-10-CM

## 2022-10-24 ENCOUNTER — Telehealth: Payer: Self-pay | Admitting: Internal Medicine

## 2022-10-24 ENCOUNTER — Encounter: Payer: Self-pay | Admitting: Internal Medicine

## 2022-10-24 ENCOUNTER — Ambulatory Visit (INDEPENDENT_AMBULATORY_CARE_PROVIDER_SITE_OTHER): Payer: Medicare Other | Admitting: Internal Medicine

## 2022-10-24 VITALS — BP 120/80 | HR 64 | Temp 97.8°F | Wt 209.0 lb

## 2022-10-24 DIAGNOSIS — R82998 Other abnormal findings in urine: Secondary | ICD-10-CM

## 2022-10-24 DIAGNOSIS — N390 Urinary tract infection, site not specified: Secondary | ICD-10-CM | POA: Diagnosis not present

## 2022-10-24 DIAGNOSIS — R300 Dysuria: Secondary | ICD-10-CM | POA: Diagnosis not present

## 2022-10-24 LAB — POCT URINALYSIS DIP (CLINITEK)
Bilirubin, UA: NEGATIVE
Glucose, UA: NEGATIVE mg/dL
Ketones, POC UA: NEGATIVE mg/dL
Nitrite, UA: NEGATIVE
POC PROTEIN,UA: NEGATIVE
Spec Grav, UA: 1.01 (ref 1.010–1.025)
Urobilinogen, UA: 0.2 U/dL — AB
pH, UA: 6.5 (ref 5.0–8.0)

## 2022-10-24 MED ORDER — NITROFURANTOIN MONOHYD MACRO 100 MG PO CAPS: 100.0000 mg | ORAL_CAPSULE | Freq: Two times a day (BID) | ORAL | 0 refills | Status: DC

## 2022-10-24 NOTE — Patient Instructions (Signed)
You have been diagnosed with an acute urinary tract infection and placed on Macrobid 100 mg twice daily for 7 days.  Urine culture has been sent and results will be communicated to you when culture results are received from the lab.  We will make  follow-up recommendations about return urine check once you receive your results from your urine culture.  Please call if not improving in 48 hours or sooner if worse.  It was a pleasure to see you today.

## 2022-10-24 NOTE — Progress Notes (Signed)
Patient Care Team: Margaree Mackintosh, MD as PCP - General (Internal Medicine) Jene Every, MD as Consulting Physician (Orthopedic Surgery)  Visit Date: 10/24/22  Subjective:    Patient ID: [name] , Female   DOB: Feb 28, 1945, 77 y.o.    MRN: [id number]   77 y.o. Female presents today for urinary frequency, dysuria since this morning. Denies fever, chills, flank pain. It has been some time since her last UTI.  Past Medical History:  Diagnosis Date   Arthritis    Genital HSV 03/2013   Positive HSV 1 genital PCR   Heart murmur    Hyperlipidemia    Hypertension    Mitral valve prolapse    since age 58   Mitral valve prolapse    Overactive bladder    Ringing in ears 2010   interfers with clarity of hearing at times   Sleep apnea    pt does not use CPAP   Stress incontinence in female    wears a pad     Family History  Problem Relation Age of Onset   Hypertension Mother    Heart disease Mother    Kidney failure Mother    Other Mother        sclaraderma   Diabetes Maternal Grandmother        TYPE 2   Hypertension Maternal Grandmother    Other Maternal Grandmother        abdominal anersym   Heart attack Father     Social History   Social History Narrative   Drinks about 2 cups of coffee a day, 1 glass of tea a day    Widow. Resides alone. Former Engineer, civil (consulting).   Review of Systems  Constitutional:  Negative for fever and malaise/fatigue.  HENT:  Negative for congestion.   Eyes:  Negative for blurred vision.  Respiratory:  Negative for cough and shortness of breath.   Cardiovascular:  Negative for chest pain, palpitations and leg swelling.  Gastrointestinal:  Negative for vomiting.  Genitourinary:  Positive for dysuria and frequency. Negative for flank pain.  Musculoskeletal:  Negative for back pain.  Skin:  Negative for rash.  Neurological:  Negative for loss of consciousness and headaches.        Objective:   Vitals: BP 120/80   Pulse 64   Temp  97.8 F (36.6 C)   Wt 209 lb (94.8 kg)   SpO2 98%   BMI 35.87 kg/m    Physical Exam Vitals and nursing note reviewed.  Constitutional:      General: She is not in acute distress.    Appearance: Normal appearance. She is not toxic-appearing.  HENT:     Head: Normocephalic and atraumatic.  Pulmonary:     Effort: Pulmonary effort is normal.  Skin:    General: Skin is warm and dry.  Neurological:     Mental Status: She is alert and oriented to person, place, and time. Mental status is at baseline.  Psychiatric:        Mood and Affect: Mood normal.        Behavior: Behavior normal.        Thought Content: Thought content normal.        Judgment: Judgment normal.       Results:   Studies obtained and personally reviewed by me:   Labs:       Component Value Date/Time   NA 142 12/10/2021 0937   K 4.8 12/10/2021 0937   CL 108  12/10/2021 0937   CO2 26 12/10/2021 0937   GLUCOSE 100 (H) 12/10/2021 0937   BUN 22 12/10/2021 0937   CREATININE 0.94 12/10/2021 0937   CALCIUM 9.2 12/10/2021 0937   PROT 6.8 12/10/2021 0937   ALBUMIN 4.4 07/12/2016 1101   AST 18 12/10/2021 0937   ALT 20 12/10/2021 0937   ALKPHOS 70 07/12/2016 1101   BILITOT 0.5 12/10/2021 0937   GFRNONAA 77 09/30/2019 0926   GFRAA 90 09/30/2019 0926     Lab Results  Component Value Date   WBC 4.6 12/10/2021   HGB 14.5 12/10/2021   HCT 43.5 12/10/2021   MCV 92.0 12/10/2021   PLT 215 12/10/2021    Lab Results  Component Value Date   CHOL 206 (H) 12/10/2021   HDL 68 12/10/2021   LDLCALC 119 (H) 12/10/2021   TRIG 89 12/10/2021   CHOLHDL 3.0 12/10/2021    Lab Results  Component Value Date   HGBA1C 5.7 (H) 12/10/2021     Lab Results  Component Value Date   TSH 2.54 12/10/2021      Assessment & Plan:   Acute UTI: urinalysis today showed cloudy urine, trace-intact blood, urobilinogen 0.2, moderate leukocytes. Prescribed Macrobid 100 mg twice daily #14 tablets for 7 days. Ordered urine  culture. Will need to review results and make follow up recommendations about return recheck urine visit.  Vaccine counseling: reports she is UTD on Covid-19, flu boosters. Suggested RSV vaccine.  Will need nurse visit after completing course of antibiotics  I,Alexander Ruley,acting as a scribe for Margaree Mackintosh, MD.,have documented all relevant documentation on the behalf of Margaree Mackintosh, MD,as directed by  Margaree Mackintosh, MD while in the presence of Margaree Mackintosh, MD.   I, Margaree Mackintosh, MD, have reviewed all documentation for this visit. The documentation on 10/24/22 for the exam, diagnosis, procedures, and orders are all accurate and complete.

## 2022-10-24 NOTE — Telephone Encounter (Signed)
Hansika Leaming [phone]  [name] called to say she woke up this morning with frequency and burning, so I went ahead and scheduled her to come in at 12:00

## 2022-10-26 LAB — URINE CULTURE
MICRO NUMBER:: 15578220
SPECIMEN QUALITY:: ADEQUATE

## 2022-11-12 ENCOUNTER — Ambulatory Visit
Admission: RE | Admit: 2022-11-12 | Discharge: 2022-11-12 | Disposition: A | Discharge status: Home / Self Care | Payer: Medicare Other | Source: Ambulatory Visit | Attending: Internal Medicine | Admitting: Internal Medicine

## 2022-11-12 DIAGNOSIS — Z1231 Encounter for screening mammogram for malignant neoplasm of breast: Secondary | ICD-10-CM

## 2022-11-12 DIAGNOSIS — M7061 Trochanteric bursitis, right hip: Secondary | ICD-10-CM | POA: Diagnosis not present

## 2022-11-12 DIAGNOSIS — M1611 Unilateral primary osteoarthritis, right hip: Secondary | ICD-10-CM | POA: Diagnosis not present

## 2022-11-13 DIAGNOSIS — M25551 Pain in right hip: Secondary | ICD-10-CM | POA: Diagnosis not present

## 2022-11-18 ENCOUNTER — Other Ambulatory Visit: Payer: Self-pay | Admitting: Internal Medicine

## 2022-11-23 ENCOUNTER — Other Ambulatory Visit: Payer: Self-pay | Admitting: Internal Medicine

## 2022-12-06 NOTE — Progress Notes (Addendum)
Annual Wellness Visit    Patient Care Team: Brayan Votaw, Luanna Cole, MD as PCP - General (Internal Medicine) Jene Every, MD as Consulting Physician (Orthopedic Surgery)  Visit Date: 12/20/22   Chief Complaint  Patient presents with   Medicare Wellness   Annual Exam   left foot pain    Subjective:   Patient: [name], Female    DOB: 11-26-1945, 77 y.o.   MRN: [id number]  [name] is a 77 y.o. Female who presents today for her Annual Wellness Visit. History of arthritis, genital HSV 2015, heart murmur, hyperlipidemia, hypertension, mitral valve prolapse, overactive bladder, sleep apnea.  History of hypertension treated with losartan 50 mg daily. Blood pressure normal in-office today at 110/70.  She has been having left dorsal foot pain for 4 weeks.  History of hyperlipidemia treated with simvastatin 20 mg daily. LDL elevated at 121 on 12/17/22, up from 119 on 12/10/21.  History of GERD treated with omeprazole 20 mg daily.  She has taken Cymbalta for many years for back pain.  She has had epidural steroid injections in the past.  Sometimes has been given a short course of prednisone.  Pincus Badder work has made symptoms worse.  She had an L4-L5 microdiscectomy in October 2004.  Currently back pain seems to be stable.   History of impaired glucose tolerance, obesity and osteoarthritis.  She has history of trace mitral regurgitation and mitral valve prolapse diagnosed in 1983 on echocardiogram.   Had avulsion of the right great toenail June 2018 and avulsion of the tip of the index finger pruning shrubbery August 2019 seen and treated by hand surgeon.  Tetanus immunization was updated at that time.   History of right total knee arthroplasty February 2012.  Left knee arthroscopic surgery 2014.  Left knee arthroscopic surgery 2010.  Another left knee arthroscopic surgery October 2011.  Surgery for torn meniscus right knee 2006.  Had total left knee arthroplasty January  2018.   Had benign right breast biopsy 1997.  Tonsillectomy and adenoidectomy at age 40.  Bilateral tubal ligation at age 31.  Right oophorectomy with D&C and uterine polypectomy in 2002.   Sulfa causes hives.  12/17/22 labs reviewed today. Glucose normal. Kidney, liver functions normal. Electrolytes normal. Blood proteins normal. CBC normal. TSH normal. Urine creatinine normal. HGBA1c normal.  Mammogram normal on 11/12/22.  Had colonoscopy in 2017 with 10-year follow-up given.   Social history: She is a widow.  Does not smoke.  Social alcohol consumption.  2 adult daughters.  Several grandchildren.  Her elderly female friend that she used to spend time with passed away not long ago.   Family history: Parents divorced when she was 15 years old.  She does not know her father's family history.  Mother died at age 73 of heart failure, kidney failure and scleroderma.  Patient is an only child.  Paternal grandmother died at age 59 of abdominal aortic aneurysm rupture.  Past Medical History:  Diagnosis Date   Arthritis    Genital HSV 03/2013   Positive HSV 1 genital PCR   Heart murmur    Hyperlipidemia    Hypertension    Mitral valve prolapse    since age 64   Mitral valve prolapse    Overactive bladder    Ringing in ears 2010   interfers with clarity of hearing at times   Sleep apnea    pt does not use CPAP   Stress incontinence in female  wears a pad     Family History  Problem Relation Age of Onset   Hypertension Mother    Heart disease Mother    Kidney failure Mother    Other Mother        sclaraderma   Heart attack Father    Diabetes Maternal Grandmother        TYPE 2   Hypertension Maternal Grandmother    Other Maternal Grandmother        abdominal anersym   Breast cancer Neg Hx    BRCA 1/2 Neg Hx      Social History   Social History Narrative   Drinks about 2 cups of coffee a day, 1 glass of tea a day    Widow. Resides alone. 2 adult daughters.  Review of  Systems  Constitutional:  Negative for chills, fever, malaise/fatigue and weight loss.  HENT:  Negative for hearing loss, sinus pain and sore throat.   Respiratory:  Negative for cough, hemoptysis and shortness of breath.   Cardiovascular:  Negative for chest pain, palpitations, leg swelling and PND.  Gastrointestinal:  Negative for abdominal pain, constipation, diarrhea, heartburn, nausea and vomiting.  Genitourinary:  Negative for dysuria, frequency and urgency.  Musculoskeletal:  Negative for back pain, myalgias and neck pain.       (+) Left foot pain dorsal  Skin:  Negative for itching and rash.  Neurological:  Negative for dizziness, tingling, seizures and headaches.  Endo/Heme/Allergies:  Negative for polydipsia.  Psychiatric/Behavioral:  Negative for depression. The patient is not nervous/anxious.       Objective:   Vitals: BP 110/70   Ht 5\' 4"  (1.626 m)   Wt 210 lb (95.3 kg)   BMI 36.05 kg/m   Physical Exam Vitals and nursing note reviewed.  Constitutional:      General: She is not in acute distress.    Appearance: Normal appearance. She is not ill-appearing or toxic-appearing.  HENT:     Head: Normocephalic and atraumatic.     Right Ear: Hearing, tympanic membrane, ear canal and external ear normal.     Left Ear: Hearing, tympanic membrane, ear canal and external ear normal.     Mouth/Throat:     Pharynx: Oropharynx is clear.  Eyes:     Extraocular Movements: Extraocular movements intact.     Pupils: Pupils are equal, round, and reactive to light.  Neck:     Thyroid: No thyroid mass, thyromegaly or thyroid tenderness.     Vascular: No carotid bruit.  Cardiovascular:     Rate and Rhythm: Normal rate and regular rhythm. No extrasystoles are present.    Pulses:          Dorsalis pedis pulses are 1+ on the right side and 1+ on the left side.     Heart sounds: Normal heart sounds. No murmur heard.    No friction rub. No gallop.  Pulmonary:     Effort: Pulmonary  effort is normal.     Breath sounds: Normal breath sounds. No decreased breath sounds, wheezing, rhonchi or rales.  Chest:     Chest wall: No mass.  Breasts:    Right: No mass.     Left: No mass.  Abdominal:     Palpations: Abdomen is soft. There is no hepatomegaly, splenomegaly or mass.     Tenderness: There is no abdominal tenderness.     Hernia: No hernia is present.  Musculoskeletal:     Cervical back: Normal range of motion.  Right lower leg: No edema.     Left lower leg: No edema.  Lymphadenopathy:     Cervical: No cervical adenopathy.     Upper Body:     Right upper body: No supraclavicular adenopathy.     Left upper body: No supraclavicular adenopathy.  Skin:    General: Skin is warm and dry.  Neurological:     General: No focal deficit present.     Mental Status: She is alert and oriented to person, place, and time. Mental status is at baseline.     Sensory: Sensation is intact.     Motor: Motor function is intact. No weakness.     Deep Tendon Reflexes: Reflexes are normal and symmetric.  Psychiatric:        Attention and Perception: Attention normal.        Mood and Affect: Mood normal.        Speech: Speech normal.        Behavior: Behavior normal.        Thought Content: Thought content normal.        Cognition and Memory: Cognition normal.        Judgment: Judgment normal.      Most recent functional status assessment:    12/20/2022    2:55 PM  In your present state of health, do you have any difficulty performing the following activities:  Hearing? 0  Vision? 0  Difficulty concentrating or making decisions? 0  Walking or climbing stairs? 0  Dressing or bathing? 0  Doing errands, shopping? 0  Preparing Food and eating ? N  Using the Toilet? N  In the past six months, have you accidently leaked urine? N  Do you have problems with loss of bowel control? N  Managing your Medications? N  Managing your Finances? N  Housekeeping or managing your  Housekeeping? N   Most recent fall risk assessment:    12/20/2022    2:54 PM  Fall Risk   Falls in the past year? 1  Number falls in past yr: 0  Injury with Fall? 1  Risk for fall due to : Other (Comment)  Follow up Education provided;Falls prevention discussed;Falls evaluation completed    Most recent depression screenings:    10/24/2022   12:05 PM 10/15/2021   12:42 PM  PHQ 2/9 Scores  PHQ - 2 Score 0 0   Most recent cognitive screening:    12/20/2022    2:58 PM  6CIT Screen  What Year? 0 points  What month? 0 points  What time? 0 points  Count back from 20 0 points     Results:   Studies obtained and personally reviewed by me:  Mammogram normal on 11/12/22.  Had colonoscopy in 2017 with 10-year follow-up given.   Labs:       Component Value Date/Time   NA 141 12/17/2022 0939   K 4.5 12/17/2022 0939   CL 107 12/17/2022 0939   CO2 27 12/17/2022 0939   GLUCOSE 89 12/17/2022 0939   BUN 22 12/17/2022 0939   CREATININE 0.81 12/17/2022 0939   CALCIUM 9.3 12/17/2022 0939   PROT 6.5 12/17/2022 0939   ALBUMIN 4.4 07/12/2016 1101   AST 19 12/17/2022 0939   ALT 20 12/17/2022 0939   ALKPHOS 70 07/12/2016 1101   BILITOT 0.6 12/17/2022 0939   GFRNONAA 77 09/30/2019 0926   GFRAA 90 09/30/2019 0926     Lab Results  Component Value Date   WBC 5.3 12/17/2022  HGB 14.4 12/17/2022   HCT 43.6 12/17/2022   MCV 93.2 12/17/2022   PLT 229 12/17/2022    Lab Results  Component Value Date   CHOL 212 (H) 12/17/2022   HDL 75 12/17/2022   LDLCALC 121 (H) 12/17/2022   TRIG 68 12/17/2022   CHOLHDL 2.8 12/17/2022    Lab Results  Component Value Date   HGBA1C 5.5 12/17/2022     Lab Results  Component Value Date   TSH 2.06 12/17/2022    Assessment & Plan:   Hypertension: treated with losartan 50 mg daily. Blood pressure normal in-office today at 110/70.  Hyperlipidemia: increase simvastatin to 40 mg daily. LDL elevated at 121 on 12/17/22, up from 119 on  12/10/21.  GERD: stable with omeprazole 20 mg daily.  Back pain:  longstanding and treated for years with Cymbalta.  Dorsal foot pain left: ordered left foot XR. Will contact with results. Rule out occult stress fracture  Mixed incontinence- refer to Dr. Arita Miss at [facility]  Pelvic exam deferred.  Mammogram normal on 11/12/22.  Had colonoscopy in 2017 with 10-year follow-up given.   Vaccine counseling: UTD on tetanus, flu boosters.  Return in 1 year or as needed.     Annual wellness visit done today including the all of the following: Reviewed patient's Family Medical History Reviewed and updated list of patient's medical providers Assessment of cognitive impairment was done Assessed patient's functional ability Established a written schedule for health screening services Health Risk Assessent Completed and Reviewed  Discussed health benefits of physical activity, and encouraged her to engage in regular exercise appropriate for her age and condition.        I,Alexander Ruley,acting as a Neurosurgeon for Margaree Mackintosh, MD.,have documented all relevant documentation on the behalf of Margaree Mackintosh, MD,as directed by  Margaree Mackintosh, MD while in the presence of Margaree Mackintosh, MD.   I, Margaree Mackintosh, MD, have reviewed all documentation for this visit. The documentation on 12/30/22 for the exam, diagnosis, procedures, and orders are all accurate and complete.IMargaree Mackintosh, MD, have reviewed all documentation for this visit. The documentation on 12/30/22 for the exam, diagnosis, procedures, and orders are all accurate and complete.

## 2022-12-17 ENCOUNTER — Other Ambulatory Visit: Payer: Medicare Other

## 2022-12-17 DIAGNOSIS — R7302 Impaired glucose tolerance (oral): Secondary | ICD-10-CM | POA: Diagnosis not present

## 2022-12-17 DIAGNOSIS — I10 Essential (primary) hypertension: Secondary | ICD-10-CM | POA: Diagnosis not present

## 2022-12-17 DIAGNOSIS — K2100 Gastro-esophageal reflux disease with esophagitis, without bleeding: Secondary | ICD-10-CM

## 2022-12-17 DIAGNOSIS — Z0000 Encounter for general adult medical examination without abnormal findings: Secondary | ICD-10-CM

## 2022-12-17 DIAGNOSIS — E7800 Pure hypercholesterolemia, unspecified: Secondary | ICD-10-CM

## 2022-12-17 DIAGNOSIS — G4733 Obstructive sleep apnea (adult) (pediatric): Secondary | ICD-10-CM

## 2022-12-18 LAB — LIPID PANEL
Cholesterol: 212 mg/dL — ABNORMAL HIGH (ref <200)
HDL: 75 mg/dL (ref >=50)
LDL Cholesterol (Calc): 121 mg/dL — ABNORMAL HIGH
Non-HDL Cholesterol (Calc): 137 mg/dL — ABNORMAL HIGH (ref <130)
Total CHOL/HDL Ratio: 2.8 (calc) (ref <5.0)
Triglycerides: 68 mg/dL (ref <150)

## 2022-12-18 LAB — COMPLETE METABOLIC PANEL WITH GFR
AG Ratio: 2 (calc) (ref 1.0–2.5)
ALT: 20 U/L (ref 6–29)
AST: 19 U/L (ref 10–35)
Albumin: 4.3 g/dL (ref 3.6–5.1)
Alkaline phosphatase (APISO): 63 U/L (ref 37–153)
BUN: 22 mg/dL (ref 7–25)
CO2: 27 mmol/L (ref 20–32)
Calcium: 9.3 mg/dL (ref 8.6–10.4)
Chloride: 107 mmol/L (ref 98–110)
Creat: 0.81 mg/dL (ref 0.60–1.00)
Globulin: 2.2 g/dL (ref 1.9–3.7)
Glucose, Bld: 89 mg/dL (ref 65–99)
Potassium: 4.5 mmol/L (ref 3.5–5.3)
Sodium: 141 mmol/L (ref 135–146)
Total Bilirubin: 0.6 mg/dL (ref 0.2–1.2)
Total Protein: 6.5 g/dL (ref 6.1–8.1)
eGFR: 74 mL/min/{1.73_m2} (ref >=60)

## 2022-12-18 LAB — CBC WITH DIFFERENTIAL/PLATELET
Absolute Lymphocytes: 1293 {cells}/uL (ref 850–3900)
Absolute Monocytes: 419 {cells}/uL (ref 200–950)
Basophils Absolute: 42 {cells}/uL (ref 0–200)
Basophils Relative: 0.8 %
Eosinophils Absolute: 223 {cells}/uL (ref 15–500)
Eosinophils Relative: 4.2 %
HCT: 43.6 % (ref 35.0–45.0)
Hemoglobin: 14.4 g/dL (ref 11.7–15.5)
MCH: 30.8 pg (ref 27.0–33.0)
MCHC: 33 g/dL (ref 32.0–36.0)
MCV: 93.2 fL (ref 80.0–100.0)
MPV: 10.1 fL (ref 7.5–12.5)
Monocytes Relative: 7.9 %
Neutro Abs: 3323 {cells}/uL (ref 1500–7800)
Neutrophils Relative %: 62.7 %
Platelets: 229 10*3/uL (ref 140–400)
RBC: 4.68 10*6/uL (ref 3.80–5.10)
RDW: 12.6 % (ref 11.0–15.0)
Total Lymphocyte: 24.4 %
WBC: 5.3 10*3/uL (ref 3.8–10.8)

## 2022-12-18 LAB — HEMOGLOBIN A1C
Hgb A1c MFr Bld: 5.5 %{Hb} (ref <5.7)
Mean Plasma Glucose: 111 mg/dL
eAG (mmol/L): 6.2 mmol/L

## 2022-12-18 LAB — TSH: TSH: 2.06 m[IU]/L (ref 0.40–4.50)

## 2022-12-18 LAB — MICROALBUMIN / CREATININE URINE RATIO
Creatinine, Urine: 120 mg/dL (ref 20–275)
Microalb Creat Ratio: 3 mg/g{creat} (ref <30)
Microalb, Ur: 0.4 mg/dL

## 2022-12-19 NOTE — Progress Notes (Shared)
Patient Care Team: Margaree Mackintosh, MD as PCP - General (Internal Medicine) Jene Every, MD as Consulting Physician (Orthopedic Surgery)  Visit Date: 12/19/22  Subjective:    Patient ID: [name] , Female   DOB: 23-Aug-1945, 77 y.o.    MRN: [id number]   77 y.o. Female presents today for left foot pain since    Past Medical History:  Diagnosis Date   Arthritis    Genital HSV 03/2013   Positive HSV 1 genital PCR   Heart murmur    Hyperlipidemia    Hypertension    Mitral valve prolapse    since age 37   Mitral valve prolapse    Overactive bladder    Ringing in ears 2010   interfers with clarity of hearing at times   Sleep apnea    pt does not use CPAP   Stress incontinence in female    wears a pad     Family History  Problem Relation Age of Onset   Hypertension Mother    Heart disease Mother    Kidney failure Mother    Other Mother        sclaraderma   Heart attack Father    Diabetes Maternal Grandmother        TYPE 2   Hypertension Maternal Grandmother    Other Maternal Grandmother        abdominal anersym   Breast cancer Neg Hx    BRCA 1/2 Neg Hx     Social History   Social History Narrative   Drinks about 2 cups of coffee a day, 1 glass of tea a day       ROS      Objective:   Vitals: There were no vitals taken for this visit.   Physical Exam    Results:   Studies obtained and personally reviewed by me:  Imaging, colonoscopy, mammogram, bone density scan, echocardiogram, heart cath, stress test, CT calcium score, etc. ***   Labs:       Component Value Date/Time   NA 141 12/17/2022 0939   K 4.5 12/17/2022 0939   CL 107 12/17/2022 0939   CO2 27 12/17/2022 0939   GLUCOSE 89 12/17/2022 0939   BUN 22 12/17/2022 0939   CREATININE 0.81 12/17/2022 0939   CALCIUM 9.3 12/17/2022 0939   PROT 6.5 12/17/2022 0939   ALBUMIN 4.4 07/12/2016 1101   AST 19 12/17/2022 0939   ALT 20 12/17/2022 0939   ALKPHOS 70 07/12/2016 1101    BILITOT 0.6 12/17/2022 0939   GFRNONAA 77 09/30/2019 0926   GFRAA 90 09/30/2019 0926     Lab Results  Component Value Date   WBC 5.3 12/17/2022   HGB 14.4 12/17/2022   HCT 43.6 12/17/2022   MCV 93.2 12/17/2022   PLT 229 12/17/2022    Lab Results  Component Value Date   CHOL 212 (H) 12/17/2022   HDL 75 12/17/2022   LDLCALC 121 (H) 12/17/2022   TRIG 68 12/17/2022   CHOLHDL 2.8 12/17/2022    Lab Results  Component Value Date   HGBA1C 5.5 12/17/2022     Lab Results  Component Value Date   TSH 2.06 12/17/2022     No results found for: "PSA1", "PSA" *** delete for female pts  ***    Assessment & Plan:   ***    I,Alexander Ruley,acting as a scribe for Margaree Mackintosh, MD.,have documented all relevant documentation on the behalf of Margaree Mackintosh, MD,as  directed by  Margaree Mackintosh, MD while in the presence of Margaree Mackintosh, MD.   ***

## 2022-12-20 ENCOUNTER — Encounter: Payer: Self-pay | Admitting: Internal Medicine

## 2022-12-20 ENCOUNTER — Ambulatory Visit (INDEPENDENT_AMBULATORY_CARE_PROVIDER_SITE_OTHER): Payer: Medicare Other | Admitting: Internal Medicine

## 2022-12-20 VITALS — BP 110/70 | Ht 64.0 in | Wt 210.0 lb

## 2022-12-20 DIAGNOSIS — Z96653 Presence of artificial knee joint, bilateral: Secondary | ICD-10-CM

## 2022-12-20 DIAGNOSIS — G4733 Obstructive sleep apnea (adult) (pediatric): Secondary | ICD-10-CM | POA: Diagnosis not present

## 2022-12-20 DIAGNOSIS — M79672 Pain in left foot: Secondary | ICD-10-CM | POA: Diagnosis not present

## 2022-12-20 DIAGNOSIS — R7302 Impaired glucose tolerance (oral): Secondary | ICD-10-CM

## 2022-12-20 DIAGNOSIS — E7800 Pure hypercholesterolemia, unspecified: Secondary | ICD-10-CM | POA: Diagnosis not present

## 2022-12-20 DIAGNOSIS — Z0000 Encounter for general adult medical examination without abnormal findings: Secondary | ICD-10-CM | POA: Diagnosis not present

## 2022-12-20 DIAGNOSIS — N3946 Mixed incontinence: Secondary | ICD-10-CM

## 2022-12-20 DIAGNOSIS — Z6836 Body mass index (BMI) 36.0-36.9, adult: Secondary | ICD-10-CM | POA: Diagnosis not present

## 2022-12-20 DIAGNOSIS — I10 Essential (primary) hypertension: Secondary | ICD-10-CM

## 2022-12-20 MED ORDER — SIMVASTATIN 40 MG PO TABS: 40.0000 mg | ORAL_TABLET | Freq: Every day | ORAL | 2 refills | Status: DC

## 2022-12-20 NOTE — Patient Instructions (Addendum)
Increase Zocor to 40 mg daily.Follow up with lipid and liver panels and office visit in 3 months. Have left foot Xrayed next week.Referral to Dr. Arita Miss for urinary incontinence. Watch diet and continue exercise regimen.

## 2022-12-24 ENCOUNTER — Ambulatory Visit
Admission: RE | Admit: 2022-12-24 | Discharge: 2022-12-24 | Disposition: A | Discharge status: Home / Self Care | Payer: Medicare Other | Source: Ambulatory Visit | Attending: Internal Medicine | Admitting: Internal Medicine

## 2022-12-24 DIAGNOSIS — M79672 Pain in left foot: Secondary | ICD-10-CM | POA: Diagnosis not present

## 2022-12-24 DIAGNOSIS — M7732 Calcaneal spur, left foot: Secondary | ICD-10-CM | POA: Diagnosis not present

## 2022-12-26 ENCOUNTER — Ambulatory Visit: Payer: Medicare Other | Admitting: Internal Medicine

## 2022-12-27 ENCOUNTER — Ambulatory Visit (INDEPENDENT_AMBULATORY_CARE_PROVIDER_SITE_OTHER): Payer: Medicare Other | Admitting: Internal Medicine

## 2022-12-27 ENCOUNTER — Ambulatory Visit: Payer: Self-pay | Admitting: Internal Medicine

## 2022-12-27 VITALS — BP 120/70 | HR 88 | Temp 98.6°F | Ht 64.0 in | Wt 209.0 lb

## 2022-12-27 DIAGNOSIS — N611 Abscess of the breast and nipple: Secondary | ICD-10-CM

## 2022-12-27 MED ORDER — FLUCONAZOLE 150 MG PO TABS: 150.0000 mg | ORAL_TABLET | Freq: Once | ORAL | 0 refills | Status: AC

## 2022-12-27 MED ORDER — MUPIROCIN 2 % EX OINT: 1.0000 | TOPICAL_OINTMENT | Freq: Two times a day (BID) | CUTANEOUS | 0 refills | Status: DC

## 2022-12-27 MED ORDER — DOXYCYCLINE HYCLATE 100 MG PO TABS: 100.0000 mg | ORAL_TABLET | Freq: Two times a day (BID) | ORAL | 0 refills | Status: DC

## 2022-12-27 NOTE — Progress Notes (Signed)
Patient Care Team: Margaree Mackintosh, MD as PCP - General (Internal Medicine) Jene Every, MD as Consulting Physician (Orthopedic Surgery)  Visit Date: 12/27/22  Subjective:    Patient ID: [name] , Female   DOB: 09-Feb-1945, 77 y.o.    MRN: [id number]   77 y.o. Female presents today for left breast abscess noticed on 12/25/22. Notes yellow drainage this morning. Tetanus is UTD. Denies history of similar skin problems.  Past Medical History:  Diagnosis Date   Arthritis    Genital HSV 03/2013   Positive HSV 1 genital PCR   Heart murmur    Hyperlipidemia    Hypertension    Mitral valve prolapse    since age 69   Mitral valve prolapse    Overactive bladder    Ringing in ears 2010   interfers with clarity of hearing at times   Sleep apnea    pt does not use CPAP   Stress incontinence in female    wears a pad     Family History  Problem Relation Age of Onset   Hypertension Mother    Heart disease Mother    Kidney failure Mother    Other Mother        sclaraderma   Heart attack Father    Diabetes Maternal Grandmother        TYPE 2   Hypertension Maternal Grandmother    Other Maternal Grandmother        abdominal anersym   Breast cancer Neg Hx    BRCA 1/2 Neg Hx     Social History   Social History Narrative   Drinks about 2 cups of coffee a day, 1 glass of tea a day       Review of Systems  Constitutional:  Negative for fever and malaise/fatigue.  HENT:  Negative for congestion.   Eyes:  Negative for blurred vision.  Respiratory:  Negative for cough and shortness of breath.   Cardiovascular:  Negative for chest pain, palpitations and leg swelling.  Gastrointestinal:  Negative for vomiting.  Musculoskeletal:  Negative for back pain.  Skin:  Negative for rash.       (+) Abscess left breast  Neurological:  Negative for loss of consciousness and headaches.        Objective:   Vitals: BP 120/70   Pulse 88   Temp 98.6 F (37 C)   Ht 5\' 4"   (1.626 m)   Wt 209 lb (94.8 kg)   SpO2 98%   BMI 35.87 kg/m    Physical Exam Vitals and nursing note reviewed.  Constitutional:      General: She is not in acute distress.    Appearance: Normal appearance. She is not toxic-appearing.  HENT:     Head: Normocephalic and atraumatic.  Pulmonary:     Effort: Pulmonary effort is normal.  Skin:    General: Skin is warm and dry.     Comments: 3 cm lesion midline left breast with purulent drainage.  Neurological:     Mental Status: She is alert and oriented to person, place, and time. Mental status is at baseline.  Psychiatric:        Mood and Affect: Mood normal.        Behavior: Behavior normal.        Thought Content: Thought content normal.        Judgment: Judgment normal.       Results:   Studies obtained and personally reviewed by  me:   Labs:       Component Value Date/Time   NA 141 12/17/2022 0939   K 4.5 12/17/2022 0939   CL 107 12/17/2022 0939   CO2 27 12/17/2022 0939   GLUCOSE 89 12/17/2022 0939   BUN 22 12/17/2022 0939   CREATININE 0.81 12/17/2022 0939   CALCIUM 9.3 12/17/2022 0939   PROT 6.5 12/17/2022 0939   ALBUMIN 4.4 07/12/2016 1101   AST 19 12/17/2022 0939   ALT 20 12/17/2022 0939   ALKPHOS 70 07/12/2016 1101   BILITOT 0.6 12/17/2022 0939   GFRNONAA 77 09/30/2019 0926   GFRAA 90 09/30/2019 0926     Lab Results  Component Value Date   WBC 5.3 12/17/2022   HGB 14.4 12/17/2022   HCT 43.6 12/17/2022   MCV 93.2 12/17/2022   PLT 229 12/17/2022    Lab Results  Component Value Date   CHOL 212 (H) 12/17/2022   HDL 75 12/17/2022   LDLCALC 121 (H) 12/17/2022   TRIG 68 12/17/2022   CHOLHDL 2.8 12/17/2022    Lab Results  Component Value Date   HGBA1C 5.5 12/17/2022     Lab Results  Component Value Date   TSH 2.06 12/17/2022     Assessment & Plan:   Cellulitis left breast: prescribed doxycycline 100 mg twice daily for 10 days, mupirocin ointment, Diflucan 150 mg once if needed for  Candida vaginitis. CMA washed with alcohol wipe, Hibiclens, dried, applied mupirocin, covered. Advised on bathing with Hibiclens OTC, Dial soap daily for one week. Apply mupirocin daily.    I,Alexander Ruley,acting as a Neurosurgeon for Margaree Mackintosh, MD.,have documented all relevant documentation on the behalf of Margaree Mackintosh, MD,as directed by  Margaree Mackintosh, MD while in the presence of Margaree Mackintosh, MD.   ***

## 2022-12-27 NOTE — Telephone Encounter (Signed)
  Chief Complaint: boil appearing spot on L breast Symptoms: painful until started draining, discharge Pertinent Negatives: Patient denies fever, denies hx of staph infections, denies anyone in household having staph infections  Disposition: [] ED /[] Urgent Care (no appt availability in office) / [x] Appointment(In office/virtual)/ []  [facility]/ [] Home Care/ [] Refused Recommended Disposition /[] [facility]/ []  Follow-up with PCP  Additional Notes: Pt has appt for today already scheduled for this illness. Pt advised to use warm washcloth/heat on area, antibiotic OTC cream, and follow hand hygiene when cleaning the area.   Copied from CRM [id number]. Topic: Clinical - Red Word Triage >> Dec 27, 2022  9:08 AM Prudencio Pair wrote: Red Word that prompted transfer to Nurse Triage: Patient has abscess on left breast; opened up with pus after putting a heat pack on it. Reason for Disposition  Boil > 2 inches across (> 5 cm; larger than a golf ball or ping pong ball)  Answer Assessment - Initial Assessment Questions 1. APPEARANCE of BOIL: "What does the boil look like?"      Red and hard lump 2. LOCATION: "Where is the boil located?"      L breast  3. NUMBER: "How many boils are there?"      1 4. SIZE: "How big is the boil?" (e.g., inches, cm; compare to size of a coin or other object)     Approx the size of thumb nail 5. ONSET: "When did the boil start?"     2 days ago 6. PAIN: "Is there any pain?" If Yes, ask: "How bad is the pain?"   (Scale 1-10; or mild, moderate, severe)     Not rateable, tender 7. FEVER: "Do you have a fever?" If Yes, ask: "What is it, how was it measured, and when did it start?"      denies 8. SOURCE: "Have you been around anyone with boils or other Staph infections?" "Have you ever had boils before?"    her 9. OTHER SYMPTOMS: "Do you have any other symptoms?" (e.g., shaking chills, weakness, rash elsewhere on body)     denies  Protocols used: Boil  (Skin Abscess)-A-AH

## 2022-12-30 ENCOUNTER — Encounter: Payer: Self-pay | Admitting: Internal Medicine

## 2022-12-30 NOTE — Patient Instructions (Signed)
Take doxycycline 100 mg twice daily for 10 days.  Prescribed mupirocin ointment to use topically.  Diflucan 150 mg tablet to take once if needed for Candida vaginitis due to antibiotic therapy.  Medical assistant here cleaned left breast area with Hibiclens and applied mupirocin and covered it.  Patient should bathe with Hibiclens over-the-counter to remove any staph aureus species.  Bathe with clean washcloth daily.  Also may use Dial soap.  Apply mupirocin twice daily to breast area.  Call if not improving in 48 hours or sooner if worse.

## 2022-12-31 LAB — POCT URINALYSIS DIP (CLINITEK)
Bilirubin, UA: NEGATIVE
Blood, UA: NEGATIVE
Glucose, UA: NEGATIVE mg/dL
Ketones, POC UA: NEGATIVE mg/dL
Leukocytes, UA: NEGATIVE
Nitrite, UA: NEGATIVE
POC PROTEIN,UA: NEGATIVE
Spec Grav, UA: 1.015 (ref 1.010–1.025)
Urobilinogen, UA: 0.2 U/dL
pH, UA: 6 (ref 5.0–8.0)

## 2023-02-03 ENCOUNTER — Other Ambulatory Visit: Payer: Self-pay | Admitting: Internal Medicine

## 2023-02-05 DIAGNOSIS — H26493 Other secondary cataract, bilateral: Secondary | ICD-10-CM | POA: Diagnosis not present

## 2023-02-28 DIAGNOSIS — R350 Frequency of micturition: Secondary | ICD-10-CM | POA: Diagnosis not present

## 2023-02-28 DIAGNOSIS — R351 Nocturia: Secondary | ICD-10-CM | POA: Diagnosis not present

## 2023-02-28 DIAGNOSIS — N3946 Mixed incontinence: Secondary | ICD-10-CM | POA: Diagnosis not present

## 2023-03-03 DIAGNOSIS — R350 Frequency of micturition: Secondary | ICD-10-CM | POA: Diagnosis not present

## 2023-03-18 ENCOUNTER — Other Ambulatory Visit: Payer: Self-pay | Admitting: Internal Medicine

## 2023-03-21 DIAGNOSIS — R350 Frequency of micturition: Secondary | ICD-10-CM | POA: Diagnosis not present

## 2023-03-21 DIAGNOSIS — N3946 Mixed incontinence: Secondary | ICD-10-CM | POA: Diagnosis not present

## 2023-04-09 DIAGNOSIS — N3946 Mixed incontinence: Secondary | ICD-10-CM | POA: Diagnosis not present

## 2023-04-09 DIAGNOSIS — R350 Frequency of micturition: Secondary | ICD-10-CM | POA: Diagnosis not present

## 2023-04-18 ENCOUNTER — Ambulatory Visit: Admitting: Internal Medicine

## 2023-05-02 ENCOUNTER — Other Ambulatory Visit: Payer: Self-pay | Admitting: Internal Medicine

## 2023-05-05 ENCOUNTER — Other Ambulatory Visit: Payer: Self-pay

## 2023-05-06 ENCOUNTER — Other Ambulatory Visit: Payer: Self-pay

## 2023-05-13 ENCOUNTER — Ambulatory Visit (INDEPENDENT_AMBULATORY_CARE_PROVIDER_SITE_OTHER): Admitting: Internal Medicine

## 2023-05-13 VITALS — BP 138/86 | HR 69 | Temp 99.0°F | Ht 65.0 in | Wt 213.4 lb

## 2023-05-13 DIAGNOSIS — R7302 Impaired glucose tolerance (oral): Secondary | ICD-10-CM | POA: Diagnosis not present

## 2023-05-13 DIAGNOSIS — I10 Essential (primary) hypertension: Secondary | ICD-10-CM

## 2023-05-13 DIAGNOSIS — M79675 Pain in left toe(s): Secondary | ICD-10-CM | POA: Diagnosis not present

## 2023-05-13 DIAGNOSIS — E7800 Pure hypercholesterolemia, unspecified: Secondary | ICD-10-CM

## 2023-05-13 DIAGNOSIS — M79672 Pain in left foot: Secondary | ICD-10-CM

## 2023-05-13 DIAGNOSIS — Z96653 Presence of artificial knee joint, bilateral: Secondary | ICD-10-CM

## 2023-05-13 DIAGNOSIS — Z9889 Other specified postprocedural states: Secondary | ICD-10-CM

## 2023-05-13 NOTE — Progress Notes (Signed)
 Patient Care Team: Sylvan Evener, MD as PCP - General (Internal Medicine) Orvan Blanch, MD as Consulting Physician (Orthopedic Surgery)  Visit Date: 05/13/23  Subjective:   Chief Complaint  Patient presents with   Foot Injury    Asking for referral for this issue.     Shoulder Injury  Patient GN:[id number] [name], [name] DOB:May 17, 1945,78 y.o. VHQ:[id number]   78 y.o. Female presents today for acute visit with Foot Pain, Left. Patient has a past medical history of S/p Bilateral Knee Arthroscopy; Hx of 2nd Left Hammertoe; S/p Left Hammertoe Repair by Dr. Ronda Cocks. Says that she has been having burning pain in her 2nd left toe that is relieved by Voltaren.   Past Medical History:  Diagnosis Date   Arthritis    Genital HSV 03/2013   Positive HSV 1 genital PCR   Heart murmur    Hyperlipidemia    Hypertension    Mitral valve prolapse    since age 60   Mitral valve prolapse    Overactive bladder    Ringing in ears 2010   interfers with clarity of hearing at times   Sleep apnea    pt does not use CPAP   Stress incontinence in female    wears a pad    Allergies  Allergen Reactions   Sulfa Antibiotics Hives   Atenolol  Other (See Comments)    bradycardia   Codeine Itching   Morphine Itching    Family History  Problem Relation Age of Onset   Hypertension Mother    Heart disease Mother    Kidney failure Mother    Other Mother        sclaraderma   Heart attack Father    Diabetes Maternal Grandmother        TYPE 2   Hypertension Maternal Grandmother    Other Maternal Grandmother        abdominal anersym   Breast cancer Neg Hx    BRCA 1/2 Neg Hx    Social History   Social History Narrative   Drinks about 2 cups of coffee a day, 1 glass of tea a day   Widow. Resides alone. Review of Systems  Musculoskeletal:        (+) Pain, 2nd left toe     Objective:  Vitals: BP 138/86   Pulse 69   Temp 99 F (37.2 C) (Temporal)   Ht 5\' 5"  (1.651 m)   Wt 213 lb 6.4 oz  (96.8 kg)   SpO2 94%   BMI 35.51 kg/m   Physical Exam Vitals and nursing note reviewed.  Constitutional:      General: She is not in acute distress.    Appearance: Normal appearance. She is not toxic-appearing.  HENT:     Head: Normocephalic and atraumatic.  Pulmonary:     Effort: Pulmonary effort is normal.  Feet:     Comments: No pain on palpation to 2nd left metatarsal Skin:    General: Skin is warm and dry.  Neurological:     Mental Status: She is alert and oriented to person, place, and time. Mental status is at baseline.  Psychiatric:        Mood and Affect: Mood normal.        Behavior: Behavior normal.        Thought Content: Thought content normal.        Judgment: Judgment normal.     Results:  Studies Obtained And Personally Reviewed By Me:  LEFT FOOT - COMPLETE  3+ VIEW  12/24/2022  COMPARISON:  Left foot radiographs 04/03/2021   FINDINGS: Redemonstration of longitudinal surgical pin at the distal metatarsal shaft and head of the second digit. Mild mid to lateral second metatarsal head subchondral cystic changes unchanged. Mild second through fifth interphalangeal joint space narrowing is unchanged.   Mild lateral greater than dorsal great toe metatarsophalangeal degenerative spurring. Mild-to-moderate plantar calcaneal heel spur. No acute fracture or dislocation.   Likely venous vascular calcifications incidentally noted overlying the distal calf.   IMPRESSION: 1. Prior surgery within the distal aspect of the second metatarsal. 2. Mild great toe metatarsophalangeal osteoarthritis. 3. Mild-to-moderate plantar calcaneal heel spur.    LEFT FOOT - COMPLETE 3+ VIEW  04/03/2021    FINDINGS: There is no evidence of fracture or dislocation. A radiopaque surgical pin is seen within the distal aspect of the second left metatarsal. A small plantar calcaneal spur is seen. There is no evidence of arthropathy or other focal bone abnormality. Mild soft tissue  swelling is seen along the dorsal aspect of the distal left foot.   IMPRESSION: 1. Mild dorsal soft tissue swelling without an acute osseous abnormality. 2. Surgical hardware within the distal aspect of the second left metatarsal. 3. Small plantar calcaneal spur.   Labs:     Component Value Date/Time   NA 141 12/17/2022 0939   K 4.5 12/17/2022 0939   CL 107 12/17/2022 0939   CO2 27 12/17/2022 0939   GLUCOSE 89 12/17/2022 0939   BUN 22 12/17/2022 0939   CREATININE 0.81 12/17/2022 0939   CALCIUM 9.3 12/17/2022 0939   PROT 6.5 12/17/2022 0939   ALBUMIN 4.4 07/12/2016 1101   AST 19 12/17/2022 0939   ALT 20 12/17/2022 0939   ALKPHOS 70 07/12/2016 1101   BILITOT 0.6 12/17/2022 0939   GFRNONAA 77 09/30/2019 0926   GFRAA 90 09/30/2019 0926    Lab Results  Component Value Date   WBC 5.3 12/17/2022   HGB 14.4 12/17/2022   HCT 43.6 12/17/2022   MCV 93.2 12/17/2022   PLT 229 12/17/2022   Lab Results  Component Value Date   CHOL 212 (H) 12/17/2022   HDL 75 12/17/2022   LDLCALC 121 (H) 12/17/2022   TRIG 68 12/17/2022   CHOLHDL 2.8 12/17/2022   Lab Results  Component Value Date   HGBA1C 5.5 12/17/2022    Lab Results  Component Value Date   TSH 2.06 12/17/2022    Assessment & Plan:  Acute Left second toe pain Pain: No history of recent accident or injury. Years ago had toe surgery by Dr. Ronda Cocks then affiliated with [facility] who has since moved out of state. Patient has history of hammertoe repair 2nd left toe, has been having some pain in that toe. Referral placed for [facility], Dr. Rosebud Confer.  HTN- well controlled  Impaired glucose tolerance- stable  Hyperlipidemia treated with statin medication        I,Emily Lagle,acting as a scribe for Sylvan Evener, MD.,have documented all relevant documentation on the behalf of Sylvan Evener, MD,as directed by  Sylvan Evener, MD while in the presence of Sylvan Evener,  MD.   I, Sylvan Evener, MD, have reviewed all documentation for this visit. The documentation on 05/14/23 for the exam, diagnosis, procedures, and orders are all accurate and complete.

## 2023-05-14 ENCOUNTER — Encounter: Payer: Self-pay | Admitting: Internal Medicine

## 2023-05-14 DIAGNOSIS — M25551 Pain in right hip: Secondary | ICD-10-CM | POA: Diagnosis not present

## 2023-05-14 NOTE — Patient Instructions (Signed)
 Referral placed to [facility], Dr. Rosebud Confer for evaluation of left second toe pain.

## 2023-05-15 DIAGNOSIS — N3946 Mixed incontinence: Secondary | ICD-10-CM | POA: Diagnosis not present

## 2023-05-15 DIAGNOSIS — R350 Frequency of micturition: Secondary | ICD-10-CM | POA: Diagnosis not present

## 2023-05-17 ENCOUNTER — Other Ambulatory Visit: Payer: Self-pay | Admitting: Internal Medicine

## 2023-05-22 NOTE — Telephone Encounter (Signed)
 Copied from CRM [id number]. Topic: Clinical - Request for Lab/Test Order >> May 22, 2023  8:41 AM Opal Bill wrote: Reason for CRM: Pt says that her appointment for the orthopedic is not until June 11th. She is asking if Dr. Liane Redman can place the order for the MRI. Please follow up.

## 2023-05-30 DIAGNOSIS — M160 Bilateral primary osteoarthritis of hip: Secondary | ICD-10-CM | POA: Diagnosis not present

## 2023-05-30 DIAGNOSIS — M25552 Pain in left hip: Secondary | ICD-10-CM | POA: Diagnosis not present

## 2023-06-11 DIAGNOSIS — Z23 Encounter for immunization: Secondary | ICD-10-CM | POA: Diagnosis not present

## 2023-06-18 DIAGNOSIS — R350 Frequency of micturition: Secondary | ICD-10-CM | POA: Diagnosis not present

## 2023-06-18 DIAGNOSIS — N3946 Mixed incontinence: Secondary | ICD-10-CM | POA: Diagnosis not present

## 2023-06-25 DIAGNOSIS — M19072 Primary osteoarthritis, left ankle and foot: Secondary | ICD-10-CM | POA: Diagnosis not present

## 2023-07-23 DIAGNOSIS — M25552 Pain in left hip: Secondary | ICD-10-CM | POA: Diagnosis not present

## 2023-07-28 ENCOUNTER — Other Ambulatory Visit: Payer: Self-pay | Admitting: Internal Medicine

## 2023-07-28 DIAGNOSIS — Z0000 Encounter for general adult medical examination without abnormal findings: Secondary | ICD-10-CM

## 2023-08-29 ENCOUNTER — Other Ambulatory Visit: Payer: Self-pay

## 2023-08-29 DIAGNOSIS — R350 Frequency of micturition: Secondary | ICD-10-CM | POA: Diagnosis not present

## 2023-08-29 MED ORDER — NYSTATIN-TRIAMCINOLONE 100000-0.1 UNIT/GM-% EX CREA
TOPICAL_CREAM | CUTANEOUS | 99 refills | Status: AC
Start: 2023-08-29 — End: ?

## 2023-09-08 DIAGNOSIS — N3000 Acute cystitis without hematuria: Secondary | ICD-10-CM | POA: Diagnosis not present

## 2023-09-08 DIAGNOSIS — N3946 Mixed incontinence: Secondary | ICD-10-CM | POA: Diagnosis not present

## 2023-09-08 DIAGNOSIS — R8271 Bacteriuria: Secondary | ICD-10-CM | POA: Diagnosis not present

## 2023-09-08 DIAGNOSIS — R350 Frequency of micturition: Secondary | ICD-10-CM | POA: Diagnosis not present

## 2023-09-22 ENCOUNTER — Other Ambulatory Visit: Payer: Self-pay | Admitting: Internal Medicine

## 2023-09-22 DIAGNOSIS — M25552 Pain in left hip: Secondary | ICD-10-CM | POA: Diagnosis not present

## 2023-10-10 DIAGNOSIS — Z23 Encounter for immunization: Secondary | ICD-10-CM | POA: Diagnosis not present

## 2023-10-22 DIAGNOSIS — Z23 Encounter for immunization: Secondary | ICD-10-CM | POA: Diagnosis not present

## 2023-10-23 DIAGNOSIS — N3946 Mixed incontinence: Secondary | ICD-10-CM | POA: Diagnosis not present

## 2023-11-13 ENCOUNTER — Inpatient Hospital Stay: Admission: RE | Admit: 2023-11-13 | Discharge: 2023-11-13 | Attending: Internal Medicine | Admitting: Internal Medicine

## 2023-11-13 DIAGNOSIS — Z0000 Encounter for general adult medical examination without abnormal findings: Secondary | ICD-10-CM

## 2023-11-13 DIAGNOSIS — Z1231 Encounter for screening mammogram for malignant neoplasm of breast: Secondary | ICD-10-CM | POA: Diagnosis not present

## 2023-11-22 ENCOUNTER — Other Ambulatory Visit: Payer: Self-pay | Admitting: Internal Medicine

## 2023-12-07 ENCOUNTER — Other Ambulatory Visit: Payer: Self-pay | Admitting: Internal Medicine

## 2023-12-22 ENCOUNTER — Other Ambulatory Visit: Payer: Medicare Other

## 2023-12-22 DIAGNOSIS — E7800 Pure hypercholesterolemia, unspecified: Secondary | ICD-10-CM

## 2023-12-22 DIAGNOSIS — R7302 Impaired glucose tolerance (oral): Secondary | ICD-10-CM | POA: Diagnosis not present

## 2023-12-22 DIAGNOSIS — Z0000 Encounter for general adult medical examination without abnormal findings: Secondary | ICD-10-CM

## 2023-12-22 DIAGNOSIS — I10 Essential (primary) hypertension: Secondary | ICD-10-CM | POA: Diagnosis not present

## 2023-12-22 DIAGNOSIS — M79673 Pain in unspecified foot: Secondary | ICD-10-CM

## 2023-12-23 LAB — CBC WITH DIFFERENTIAL/PLATELET
Absolute Lymphocytes: 1185 {cells}/uL (ref 850–3900)
Absolute Monocytes: 380 {cells}/uL (ref 200–950)
Basophils Absolute: 50 {cells}/uL (ref 0–200)
Basophils Relative: 1 %
Eosinophils Absolute: 270 {cells}/uL (ref 15–500)
Eosinophils Relative: 5.4 %
HCT: 47.1 % — ABNORMAL HIGH (ref 35.9–46.0)
Hemoglobin: 15.2 g/dL (ref 11.7–15.5)
MCH: 30 pg (ref 27.0–33.0)
MCHC: 32.3 g/dL (ref 31.6–35.4)
MCV: 92.9 fL (ref 81.4–101.7)
MPV: 11.5 fL (ref 7.5–12.5)
Monocytes Relative: 7.6 %
Neutro Abs: 3115 {cells}/uL (ref 1500–7800)
Neutrophils Relative %: 62.3 %
Platelets: 210 Thousand/uL (ref 140–400)
RBC: 5.07 Million/uL (ref 3.80–5.10)
RDW: 12.2 % (ref 11.0–15.0)
Total Lymphocyte: 23.7 %
WBC: 5 Thousand/uL (ref 3.8–10.8)

## 2023-12-23 LAB — COMPREHENSIVE METABOLIC PANEL WITH GFR
AG Ratio: 2.1 (calc) (ref 1.0–2.5)
ALT: 16 U/L (ref 6–29)
AST: 18 U/L (ref 10–35)
Albumin: 4.6 g/dL (ref 3.6–5.1)
Alkaline phosphatase (APISO): 71 U/L (ref 37–153)
BUN: 20 mg/dL (ref 7–25)
CO2: 27 mmol/L (ref 20–32)
Calcium: 9.3 mg/dL (ref 8.6–10.4)
Chloride: 106 mmol/L (ref 98–110)
Creat: 0.71 mg/dL (ref 0.60–1.00)
Globulin: 2.2 g/dL (ref 1.9–3.7)
Glucose, Bld: 97 mg/dL (ref 65–99)
Potassium: 4.6 mmol/L (ref 3.5–5.3)
Sodium: 142 mmol/L (ref 135–146)
Total Bilirubin: 0.6 mg/dL (ref 0.2–1.2)
Total Protein: 6.8 g/dL (ref 6.1–8.1)
eGFR: 86 mL/min/1.73m2 (ref >=60)

## 2023-12-23 LAB — URIC ACID: Uric Acid, Serum: 5 mg/dL (ref 2.5–7.0)

## 2023-12-23 LAB — MICROALBUMIN / CREATININE URINE RATIO
Creatinine, Urine: 132 mg/dL (ref 20–275)
Microalb Creat Ratio: 8 mg/g{creat} (ref <30)
Microalb, Ur: 1 mg/dL

## 2023-12-23 LAB — TSH: TSH: 1.87 m[IU]/L (ref 0.40–4.50)

## 2023-12-23 LAB — LIPID PANEL
Cholesterol: 158 mg/dL (ref <200)
HDL: 61 mg/dL (ref >=50)
LDL Cholesterol (Calc): 82 mg/dL
Non-HDL Cholesterol (Calc): 97 mg/dL (ref <130)
Total CHOL/HDL Ratio: 2.6 (calc) (ref <5.0)
Triglycerides: 74 mg/dL (ref <150)

## 2023-12-23 LAB — HEMOGLOBIN A1C
Hgb A1c MFr Bld: 5.2 % (ref <5.7)
Mean Plasma Glucose: 103 mg/dL
eAG (mmol/L): 5.7 mmol/L

## 2023-12-24 ENCOUNTER — Ambulatory Visit: Payer: Medicare Other

## 2023-12-25 ENCOUNTER — Ambulatory Visit: Admitting: Internal Medicine

## 2023-12-25 ENCOUNTER — Encounter: Payer: Self-pay | Admitting: Internal Medicine

## 2023-12-25 ENCOUNTER — Ambulatory Visit: Payer: Medicare Other | Admitting: Internal Medicine

## 2023-12-25 VITALS — BP 110/80 | HR 84 | Ht 63.0 in | Wt 201.0 lb

## 2023-12-25 DIAGNOSIS — R7302 Impaired glucose tolerance (oral): Secondary | ICD-10-CM | POA: Diagnosis not present

## 2023-12-25 DIAGNOSIS — Z6835 Body mass index (BMI) 35.0-35.9, adult: Secondary | ICD-10-CM

## 2023-12-25 DIAGNOSIS — K219 Gastro-esophageal reflux disease without esophagitis: Secondary | ICD-10-CM | POA: Diagnosis not present

## 2023-12-25 DIAGNOSIS — N3946 Mixed incontinence: Secondary | ICD-10-CM | POA: Diagnosis not present

## 2023-12-25 DIAGNOSIS — E7800 Pure hypercholesterolemia, unspecified: Secondary | ICD-10-CM

## 2023-12-25 DIAGNOSIS — I10 Essential (primary) hypertension: Secondary | ICD-10-CM | POA: Diagnosis not present

## 2023-12-25 DIAGNOSIS — Z0000 Encounter for general adult medical examination without abnormal findings: Secondary | ICD-10-CM | POA: Diagnosis not present

## 2023-12-25 DIAGNOSIS — E785 Hyperlipidemia, unspecified: Secondary | ICD-10-CM

## 2023-12-25 DIAGNOSIS — G4733 Obstructive sleep apnea (adult) (pediatric): Secondary | ICD-10-CM

## 2023-12-25 LAB — POCT URINALYSIS DIP (CLINITEK)
Bilirubin, UA: NEGATIVE
Blood, UA: NEGATIVE
Glucose, UA: NEGATIVE mg/dL
Ketones, POC UA: NEGATIVE mg/dL
Leukocytes, UA: NEGATIVE
Nitrite, UA: NEGATIVE
POC PROTEIN,UA: NEGATIVE
Spec Grav, UA: 1.015 (ref 1.010–1.025)
Urobilinogen, UA: 0.2 U/dL
pH, UA: 6.5 (ref 5.0–8.0)

## 2023-12-25 NOTE — Progress Notes (Signed)
 "  Annual Wellness Visit   Patient Care Team: Beverlie Kurihara, Ronal PARAS, MD as PCP - General (Internal Medicine) Duwayne Purchase, MD as Consulting Physician (Orthopedic Surgery)  Visit Date: 12/25/2023   Chief Complaint  Patient presents with   Annual Exam    Top of foot pain left, going for several months.    Subjective:  Patient: [name], Female DOB: [birth year], 78 y.o. MRN: [id number] Vitals:   12/25/23 1625  BP: 110/80   [name] is a 78 y.o. Female who presents today for her Annual Wellness Visit. Patient has Hyperlipemia; OBSTRUCTIVE SLEEP APNEA; Obesity; Mitral valve prolapse; Osteoarthritis of left knee; Impaired glucose tolerance; Vitamin D  deficiency; Decreased libido; Primary osteoarthritis of left knee; Left knee DJD; Pain in left foot; Trigger finger of left hand; and Essential hypertension on their problem list.   Has been having pain in her left foot. She says that the pain will last about 5 minutes. She says that putting pressure on the area will relieve the pain.   History of mixed incontinence, followed by Dr. Scot Macdiarmid at [facility] urology.   History of hypertension treated with losartan  50 mg daily. Blood pressure today is normal at 110/80.   History of hyperlipidemia treated with simvastatin  40 mg daily.  12/22/2023 Lipid panel normal.    History of GERD treated with omeprazole 20 mg daily.   She has taken Cymbalta  for many years for back pain.  She has had epidural steroid injections in the past.  Sometimes has been given a short course of prednisone .  Oralia work has made symptoms worse.  She had an L4-L5 microdiscectomy in October 2004.  Currently back pain seems to be stable.   History of impaired glucose tolerance, 12/22/2023 Hemoglobin 5.2% decreased from 5.5% a year ago.   She has history of trace mitral regurgitation and mitral valve prolapse diagnosed in 1983 on echocardiogram.   Had avulsion of the right great toenail June 2018 and  avulsion of the tip of the index finger pruning shrubbery August 2019 seen and treated by hand surgeon.  Tetanus immunization was updated at that time.   History of right total knee arthroplasty February 2012.  Left knee arthroscopic surgery 2014.  Left knee arthroscopic surgery 2010.  Another left knee arthroscopic surgery October 2011.  Surgery for torn meniscus right knee 2006.  Had total left knee arthroplasty January 2018.   Had benign right breast biopsy 1997.  Tonsillectomy and adenoidectomy at age 2.  Bilateral tubal ligation at age 78.  Right oophorectomy with D&C and uterine polypectomy in 2002.   Sulfa causes hives.  Labs 12/22/2023 HCT 47.1, Otherwise WNL.    11/17/2023 Mammogram No mammographic evidence of malignancy. Repeat in one year.    10/16/2015 Colonoscopy Small mouthed diverticula were found in the sigmoid colon. No other significant abnormalities were found in a careful examination of the remainder of the colon.    Vaccine counseling: UTD on Covid-19 and Influenza vaccines. Pneumonia vaccine due.   Health Maintenance: Eye exam in January.   Health Maintenance  Topic Date Due   COVID-19 Vaccine (5 - 2025-26 season) 09/15/2023   FOOT EXAM  12/20/2023   OPHTHALMOLOGY EXAM  02/05/2024   HEMOGLOBIN A1C  06/21/2024   Mammogram  11/12/2024   Diabetic kidney evaluation - eGFR measurement  12/21/2024   Diabetic kidney evaluation - Urine ACR  12/21/2024   Medicare Annual Wellness (AWV)  12/24/2024   DTaP/Tdap/Td (3 - Td or Tdap) 08/27/2027  Pneumococcal Vaccine: 50+ Years  Completed   Influenza Vaccine  Completed   Bone Density Scan  Completed   Zoster Vaccines- Shingrix  Completed   Meningococcal B Vaccine  Aged Out   Colonoscopy  Discontinued   Hepatitis C Screening  Discontinued     Review of Systems  Constitutional:  Negative for fever and malaise/fatigue.  HENT:  Negative for congestion.   Eyes:  Negative for blurred vision.  Respiratory:  Negative for  cough and shortness of breath.   Cardiovascular:  Negative for chest pain, palpitations and leg swelling.  Gastrointestinal:  Negative for vomiting.  Musculoskeletal:  Negative for back pain.  Skin:  Negative for rash.  Neurological:  Negative for loss of consciousness and headaches.   Objective:  Vitals: body mass index is 35.61 kg/m. Today's Vitals   12/25/23 1625  BP: 110/80  Pulse: 84  SpO2: 97%  Weight: 201 lb (91.2 kg)  Height: 5' 3 (1.6 m)  PainSc: 4   PainLoc: Foot   Physical Exam Vitals and nursing note reviewed.  Constitutional:      General: She is not in acute distress.    Appearance: Normal appearance. She is not ill-appearing or toxic-appearing.  HENT:     Head: Normocephalic and atraumatic.     Right Ear: Hearing, tympanic membrane, ear canal and external ear normal.     Left Ear: Hearing, tympanic membrane, ear canal and external ear normal.     Mouth/Throat:     Pharynx: Oropharynx is clear.  Eyes:     Extraocular Movements: Extraocular movements intact.     Pupils: Pupils are equal, round, and reactive to light.  Neck:     Thyroid : No thyroid  mass, thyromegaly or thyroid  tenderness.     Vascular: No carotid bruit.  Cardiovascular:     Rate and Rhythm: Normal rate and regular rhythm. No extrasystoles are present.    Pulses:          Dorsalis pedis pulses are 2+ on the right side and 2+ on the left side.     Heart sounds: Normal heart sounds. No murmur heard.    No friction rub. No gallop.  Pulmonary:     Effort: Pulmonary effort is normal.     Breath sounds: Normal breath sounds. No decreased breath sounds, wheezing, rhonchi or rales.  Chest:     Chest wall: No mass.  Abdominal:     Palpations: Abdomen is soft. There is no hepatomegaly, splenomegaly or mass.     Tenderness: There is no abdominal tenderness.     Hernia: No hernia is present.  Musculoskeletal:     Cervical back: Normal range of motion.     Right lower leg: No edema.     Left  lower leg: No edema.  Feet:     Comments: Sensation and positional sense intact.  Lymphadenopathy:     Cervical: No cervical adenopathy.     Upper Body:     Right upper body: No supraclavicular adenopathy.     Left upper body: No supraclavicular adenopathy.  Skin:    General: Skin is warm and dry.  Neurological:     General: No focal deficit present.     Mental Status: She is alert and oriented to person, place, and time. Mental status is at baseline.     Sensory: Sensation is intact.     Motor: Motor function is intact. No weakness.     Deep Tendon Reflexes: Reflexes are normal and symmetric.  Psychiatric:        Attention and Perception: Attention normal.        Mood and Affect: Mood normal.        Speech: Speech normal.        Behavior: Behavior normal.        Thought Content: Thought content normal.        Cognition and Memory: Cognition normal.        Judgment: Judgment normal.     Current Outpatient Medications  Medication Instructions   acetaminophen  (TYLENOL ) 1,000 mg, Every 6 hours PRN   ALPHA-LIPOIC ACID PO 1 tablet, Daily   cholecalciferol  (VITAMIN D ) 2,000 Units, Daily at bedtime   Coenzyme Q10 (CO Q 10 PO) 1 tablet, Daily   docusate sodium  (COLACE) 250 mg, Daily at bedtime   DULoxetine  (CYMBALTA ) 30 MG capsule TAKE 3 CAPSULES(90 MG) BY MOUTH DAILY   losartan  (COZAAR ) 50 MG tablet TAKE 1 TABLET(50 MG) BY MOUTH DAILY   nystatin -triamcinolone  (MYCOLOG II) cream APPLY TOPICALLY TO THE AFFECTED AREA TWICE DAILY   Omega-3 Fatty Acids (FISH OIL PO) 2 capsules, 2 times daily   omeprazole (PRILOSEC) 20 mg, Daily   simvastatin  (ZOCOR ) 40 mg, Oral, Daily at bedtime   TURMERIC PO 1 tablet, Daily at bedtime   Voltaren 2 g, 2 times daily   Past Medical History:  Diagnosis Date   Arthritis    Genital HSV 03/2013   Positive HSV 1 genital PCR   Heart murmur    Hyperlipidemia    Hypertension    Mitral valve prolapse    since age 36   Mitral valve prolapse    Overactive  bladder    Ringing in ears 2010   interfers with clarity of hearing at times   Sleep apnea    pt does not use CPAP   Stress incontinence in female    wears a pad   Medical/Surgical History Narrative:  Allergic/Intolerant to: Allergies[1]  Past Surgical History:  Procedure Laterality Date   BACK SURGERY     BREAST BIOPSY Left 08/07/2015   benign   BREAST EXCISIONAL BIOPSY Right 01/27/1996   benign   BREAST SURGERY     BENIGN RIGHT BR MASS,Bx's X 2   FOOT SURGERY  2013   CORRECTION OF HAMMER TOES -LEFT FOOT   HAND SURGERY     RIGHT THUMB BONE SPUR   JOINT REPLACEMENT     KNEE ARTHROSCOPY  2006, 2010, 2012   LEFT KNEE IN  2010/ RIGHT KNEE IN 2006 AND 2012   KNEE ARTHROSCOPY  01/24/2012   Procedure: ARTHROSCOPY KNEE;  Surgeon: Reyes JAYSON Billing, MD;  Location: [facility];  Service: Orthopedics;  Laterality: Left;  left knee arthroscopy with debridment, partial lateral menisectomy   OOPHORECTOMY     RIGHT OVARY REMOVED   SPINE SURGERY     TONSILLECTOMY     TOTAL KNEE ARTHROPLASTY  2012   Right   TOTAL KNEE ARTHROPLASTY Left 01/25/2016   Procedure: LEFT TOTAL KNEE ARTHROPLASTY, EVALUATION UNDER ANESTHESIA AND MANIPULATION UNDER ANESTHESIA;  Surgeon: Reyes Billing, MD;  Location: [facility];  Service: Orthopedics;  Laterality: Left;   TRIGGER FINGER RELEASE Left 12/25/2017   Procedure: RELEASE TRIGGER FINGER/A-1 PULLEY LEFT MIDDLE FINGER;  Surgeon: Murrell Kuba, MD;  Location: [facility];  Service: Orthopedics;  Laterality: Left;   TUBAL LIGATION     Family History  Problem Relation Age of Onset   Hypertension Mother    Heart disease Mother  Kidney failure Mother    Other Mother        sclaraderma   Heart attack Father    Diabetes Maternal Grandmother        TYPE 2   Hypertension Maternal Grandmother    Other Maternal Grandmother        abdominal anersym   Breast cancer Neg Hx    BRCA 1/2 Neg Hx    Family History Narrative: Parents divorced  when she was 53 years old. She does not know her father's family history. Mother died at age 70 of heart failure, kidney failure and scleroderma. Patient is an only child. Paternal grandmother died at age 85 of abdominal aortic aneurysm rupture.  Social History   Social History Narrative   Drinks about 2 cups of coffee a day, 1 glass of tea a day   She is a widow. Does not smoke. Social alcohol consumption. 2 adult daughters. Several grandchildren. Her elderly female friend that she used to spend time with passed away not long ago.  Most Recent Health Risks Assessment:   Most Recent Social Determinants of Health (Including Hx of Tobacco, Alcohol, and Drug Use) SDOH Screenings   Food Insecurity: No Food Insecurity (12/20/2022)  Housing: Low Risk (12/20/2022)  Transportation Needs: No Transportation Needs (12/20/2022)  Utilities: Not At Risk (12/20/2022)  Alcohol Screen: Low Risk (12/20/2022)  Depression (PHQ2-9): Low Risk (12/25/2023)  Financial Resource Strain: Low Risk (12/20/2022)  Physical Activity: Insufficiently Active (12/20/2022)  Social Connections: Moderately Isolated (12/20/2022)  Stress: No Stress Concern Present (12/20/2022)  Tobacco Use: Low Risk (12/25/2023)  Health Literacy: Adequate Health Literacy (12/20/2022)   Social History[2]  Most Recent Fall Risk Assessment:    05/13/2023    2:53 PM  Fall Risk   Falls in the past year? 0  Number falls in past yr: 0  Risk for fall due to : No Fall Risks  Follow up Falls evaluation completed   Most Recent Anxiety/Depression Screenings:    12/25/2023    4:25 PM 05/13/2023    2:53 PM  PHQ 2/9 Scores  PHQ - 2 Score 0 0  PHQ- 9 Score 0       12/25/2023    4:25 PM 05/13/2023    2:53 PM  GAD 7 : Generalized Anxiety Score  Nervous, Anxious, on Edge 0 0  Control/stop worrying 0 0  Worry too much - different things 0 0  Trouble relaxing 0 0  Restless 0 0  Easily annoyed or irritable 0 0  Afraid - awful might happen 0 0  Total GAD  7 Score 0 0  Anxiety Difficulty Not difficult at all Not difficult at all   Most Recent Cognitive Screening:    12/20/2022    2:58 PM  6CIT Screen  What Year? 0 points  What month? 0 points  What time? 0 points  Count back from 20 0 points   Results:  Studies Obtained And Personally Reviewed By Me: 11/17/2023 Mammogram No mammographic evidence of malignancy. Repeat in one year.    10/16/2015 Colonoscopy Small mouthed diverticula were found in the sigmoid colon. No other significant abnormalities were found in a careful examination of the remainder of the colon.   Labs:  CBC w/ Differential Lab Results  Component Value Date   WBC 5.0 12/22/2023   RBC 5.07 12/22/2023   HGB 15.2 12/22/2023   HCT 47.1 (H) 12/22/2023   PLT 210 12/22/2023   MCV 92.9 12/22/2023   MCH 30.0 12/22/2023  MCHC 32.3 12/22/2023   RDW 12.2 12/22/2023   MPV 11.5 12/22/2023   LYMPHSABS 1,251 12/10/2021   MONOABS 451 07/12/2016   BASOSABS 50 12/22/2023    Comprehensive Metabolic Panel Lab Results  Component Value Date   NA 142 12/22/2023   K 4.6 12/22/2023   CL 106 12/22/2023   CO2 27 12/22/2023   GLUCOSE 97 12/22/2023   BUN 20 12/22/2023   CREATININE 0.71 12/22/2023   CALCIUM 9.3 12/22/2023   PROT 6.8 12/22/2023   ALBUMIN 4.4 07/12/2016   AST 18 12/22/2023   ALT 16 12/22/2023   ALKPHOS 70 07/12/2016   BILITOT 0.6 12/22/2023   EGFR 86 12/22/2023   GFRNONAA 77 09/30/2019   Lipid Panel  Lab Results  Component Value Date   CHOL 158 12/22/2023   HDL 61 12/22/2023   LDLCALC 82 12/22/2023   TRIG 74 12/22/2023   A1c Lab Results  Component Value Date   HGBA1C 5.2 12/22/2023    TSH Lab Results  Component Value Date   TSH 1.87 12/22/2023    Assessment & Plan:   Mixed incontinence: followed by Dr. Scot MacDiarmid at [facility] urology.   Hypertension: treated with losartan  50 mg daily. Blood pressure today is normal at 110/80.   Hyperlipidemia: treated with simvastatin  40 mg daily.   12/22/2023 Lipid panel normal.    GERD: treated with omeprazole 20 mg daily.   She has taken Cymbalta  for many years for back pain.  She has had epidural steroid injections in the past.  Sometimes has been given a short course of prednisone .  Oralia work has made symptoms worse.  She had an L4-L5 microdiscectomy in October 2004.  Currently back pain seems to be stable.   impaired glucose tolerance: 12/22/2023 Hemoglobin 5.2% decreased from 5.5% a year ago.   11/17/2023 Mammogram No mammographic evidence of malignancy. Repeat in one year.    10/16/2015 Colonoscopy Small mouthed diverticula were found in the sigmoid colon. No other significant abnormalities were found in a careful examination of the remainder of the colon.    Vaccine counseling: UTD on Covid-19 and Influenza vaccines. Pneumonia vaccine due.   Health Maintenance: Eye exam in January.      Annual Wellness Visit done today including the all of the following: Reviewed patient's Family Medical History Reviewed patient's SDOH and reviewed tobacco, alcohol, and drug use.  Reviewed and updated list of patient's medical providers Assessment of cognitive impairment was done Assessed patient's functional ability Established a written schedule for health screening services Health Risk Assessent Completed and Reviewed  Discussed health benefits of physical activity, and encouraged her to engage in regular exercise appropriate for her age and condition.    I,Makayla C Reid,acting as a scribe for Ronal JINNY Hailstone, MD.,have documented all relevant documentation on the behalf of Ronal JINNY Hailstone, MD,as directed by  Ronal JINNY Hailstone, MD while in the presence of Ronal JINNY Hailstone, MD.  I, Ronal JINNY Hailstone, MD, have reviewed all documentation for and agree with the above Annual Wellness Visit documentation.  Ronal JINNY Hailstone, MD Internal Medicine 12/25/2023     [1]  Allergies Allergen Reactions   Sulfa Antibiotics Hives   Atenolol  Other (See  Comments)    bradycardia   Codeine Itching   Morphine Itching  [2]  Social History Tobacco Use   Smoking status: Never   Smokeless tobacco: Never  Vaping Use   Vaping status: Never Used  Substance Use Topics   Alcohol use: Yes  Comment: 1 glass of wine/month   Drug use: No   "

## 2023-12-26 ENCOUNTER — Ambulatory Visit: Admitting: Internal Medicine

## 2023-12-26 ENCOUNTER — Other Ambulatory Visit: Payer: Self-pay | Admitting: Internal Medicine

## 2024-01-01 ENCOUNTER — Ambulatory Visit: Payer: Medicare Other | Admitting: Internal Medicine

## 2024-01-05 ENCOUNTER — Encounter: Payer: Self-pay | Admitting: Internal Medicine

## 2024-01-05 ENCOUNTER — Ambulatory Visit: Admitting: Internal Medicine

## 2024-01-05 DIAGNOSIS — Z0000 Encounter for general adult medical examination without abnormal findings: Secondary | ICD-10-CM | POA: Diagnosis not present

## 2024-01-05 NOTE — Progress Notes (Signed)
 "  Chief Complaint  Patient presents with   Annual Exam   Medicare Wellness     Subjective:   [name] is a 78 y.o. female who presents for a Medicare Annual Wellness Visit.  Visit info / Clinical Intake: Medicare Wellness Visit Type:: Subsequent Annual Wellness Visit Persons participating in visit and providing information:: patient Medicare Wellness Visit Mode:: Telephone If telephone:: video declined Interpreter Needed?: No Pre-visit prep was completed: yes AWV questionnaire completed by patient prior to visit?: no Living arrangements:: (!) lives alone Patient's Overall Health Status Rating: very good Typical amount of pain: none Does pain affect daily life?: no Are you currently prescribed opioids?: no  Dietary Habits and Nutritional Risks How many meals a day?: 3 Eats fruit and vegetables daily?: yes Most meals are obtained by: preparing own meals In the last 2 weeks, have you had any of the following?: none Diabetic:: no  Functional Status Activities of Daily Living (to include ambulation/medication): Independent Ambulation: Independent Medication Administration: Independent Home Management (perform basic housework or laundry): Independent Manage your own finances?: yes Primary transportation is: driving Concerns about vision?: (!) yes Concerns about hearing?: (!) yes Uses hearing aids?: (!) yes Hear whispered voice?: (!) no *in-person visit only*  Fall Screening Falls in the past year?: 1 Number of falls in past year: 1 Was there an injury with Fall?: 0 Fall Risk Category Calculator: 2 Patient Fall Risk Level: Moderate Fall Risk  Fall Risk Patient at Risk for Falls Due to: No Fall Risks Fall risk Follow up: Falls prevention discussed; Education provided; Falls evaluation completed  Home and Transportation Safety: All rugs have non-skid backing?: yes All stairs or steps have railings?: yes Grab bars in the bathtub or shower?: (!) no Have  non-skid surface in bathtub or shower?: yes Good home lighting?: yes Regular seat belt use?: yes Hospital stays in the last year:: no  Cognitive Assessment Difficulty concentrating, remembering, or making decisions? : no Will 6CIT or Mini Cog be Completed: no 6CIT or Mini Cog Declined: patient alert, oriented, able to answer questions appropriately and recall recent events  Advance Directives (For Healthcare) Does Patient Have a Medical Advance Directive?: Yes Does patient want to make changes to medical advance directive?: No - Patient declined Type of Advance Directive: Living will; Healthcare Power of Attorney Copy of Healthcare Power of Attorney in Chart?: No - copy requested Copy of Living Will in Chart?: No - copy requested  Reviewed/Updated  Reviewed/Updated: Reviewed All (Medical, Surgical, Family, Medications, Allergies, Care Teams, Patient Goals)    Allergies (verified) Sulfa antibiotics, Atenolol , Codeine, and Morphine   Current Medications (verified) Outpatient Encounter Medications as of 01/05/2024  Medication Sig   acetaminophen  (TYLENOL ) 500 MG tablet Take 1,000 mg by mouth every 6 (six) hours as needed (for pain).   ALPHA-LIPOIC ACID PO Take 1 tablet by mouth daily.   cholecalciferol  (VITAMIN D ) 1000 units tablet Take 2,000 Units by mouth at bedtime.    Coenzyme Q10 (CO Q 10 PO) Take 1 tablet by mouth daily.   docusate sodium  (COLACE) 250 MG capsule Take 250 mg by mouth at bedtime.   DULoxetine  (CYMBALTA ) 30 MG capsule TAKE 3 CAPSULES(90 MG) BY MOUTH DAILY   losartan  (COZAAR ) 50 MG tablet TAKE 1 TABLET(50 MG) BY MOUTH DAILY   nystatin -triamcinolone  (MYCOLOG II) cream APPLY TOPICALLY TO THE AFFECTED AREA TWICE DAILY   Omega-3 Fatty Acids (FISH OIL PO) Take 2 capsules by mouth 2 (two) times daily.    omeprazole (PRILOSEC)  20 MG capsule Take 20 mg by mouth daily.   simvastatin  (ZOCOR ) 40 MG tablet TAKE 1 TABLET(40 MG) BY MOUTH AT BEDTIME   TURMERIC PO Take 1 tablet  by mouth at bedtime.    VOLTAREN 1 % GEL Apply 2 g topically 2 (two) times daily.    No facility-administered encounter medications on file as of 01/05/2024.    History: Past Medical History:  Diagnosis Date   Arthritis    Genital HSV 03/2013   Positive HSV 1 genital PCR   Heart murmur    Hyperlipidemia    Hypertension    Mitral valve prolapse    since age 3   Mitral valve prolapse    Overactive bladder    Ringing in ears 2010   interfers with clarity of hearing at times   Sleep apnea    pt does not use CPAP   Stress incontinence in female    wears a pad   Past Surgical History:  Procedure Laterality Date   BACK SURGERY     BREAST BIOPSY Left 08/07/2015   benign   BREAST EXCISIONAL BIOPSY Right 01/27/1996   benign   BREAST SURGERY     BENIGN RIGHT BR MASS,Bx's X 2   FOOT SURGERY  2013   CORRECTION OF HAMMER TOES -LEFT FOOT   HAND SURGERY     RIGHT THUMB BONE SPUR   JOINT REPLACEMENT     KNEE ARTHROSCOPY  2006, 2010, 2012   LEFT KNEE IN  2010/ RIGHT KNEE IN 2006 AND 2012   KNEE ARTHROSCOPY  01/24/2012   Procedure: ARTHROSCOPY KNEE;  Surgeon: Reyes JAYSON Billing, MD;  Location: [facility];  Service: Orthopedics;  Laterality: Left;  left knee arthroscopy with debridment, partial lateral menisectomy   OOPHORECTOMY     RIGHT OVARY REMOVED   SPINE SURGERY     TONSILLECTOMY     TOTAL KNEE ARTHROPLASTY  2012   Right   TOTAL KNEE ARTHROPLASTY Left 01/25/2016   Procedure: LEFT TOTAL KNEE ARTHROPLASTY, EVALUATION UNDER ANESTHESIA AND MANIPULATION UNDER ANESTHESIA;  Surgeon: Reyes Billing, MD;  Location: [facility];  Service: Orthopedics;  Laterality: Left;   TRIGGER FINGER RELEASE Left 12/25/2017   Procedure: RELEASE TRIGGER FINGER/A-1 PULLEY LEFT MIDDLE FINGER;  Surgeon: Murrell Kuba, MD;  Location: [facility];  Service: Orthopedics;  Laterality: Left;   TUBAL LIGATION     Family History  Problem Relation Age of Onset   Hypertension Mother     Heart disease Mother    Kidney failure Mother    Other Mother        sclaraderma   Heart attack Father    Diabetes Maternal Grandmother        TYPE 2   Hypertension Maternal Grandmother    Other Maternal Grandmother        abdominal anersym   Breast cancer Neg Hx    BRCA 1/2 Neg Hx    Social History   Occupational History   Not on file  Tobacco Use   Smoking status: Never   Smokeless tobacco: Never  Vaping Use   Vaping status: Never Used  Substance and Sexual Activity   Alcohol use: Yes    Comment: 1 glass of wine/month   Drug use: No   Sexual activity: Not Currently    Birth control/protection: Post-menopausal    Comment: 1st intercourse 21 yo-2 partners   Tobacco Counseling Counseling given: No  SDOH Screenings   Food Insecurity: No Food Insecurity (01/05/2024)  Housing: Low Risk (01/05/2024)  Transportation Needs: No Transportation Needs (01/05/2024)  Utilities: Not At Risk (01/05/2024)  Alcohol Screen: Low Risk (01/05/2024)  Depression (PHQ2-9): Low Risk (12/25/2023)  Financial Resource Strain: Low Risk (01/05/2024)  Physical Activity: Insufficiently Active (01/05/2024)  Social Connections: Moderately Isolated (01/05/2024)  Stress: No Stress Concern Present (01/05/2024)  Tobacco Use: Low Risk (01/05/2024)  Health Literacy: Adequate Health Literacy (01/05/2024)   See flowsheets for full screening details  Depression Screen PHQ 2 & 9 Depression Scale- Over the past 2 weeks, how often have you been bothered by any of the following problems? Little interest or pleasure in doing things: 0 Feeling down, depressed, or hopeless (PHQ Adolescent also includes...irritable): 0 PHQ-2 Total Score: 0 Trouble falling or staying asleep, or sleeping too much: 0 Feeling tired or having little energy: 0 Poor appetite or overeating (PHQ Adolescent also includes...weight loss): 0 Feeling bad about yourself - or that you are a failure or have let yourself or your family down:  0 Trouble concentrating on things, such as reading the newspaper or watching television (PHQ Adolescent also includes...like school work): 0 Moving or speaking so slowly that other people could have noticed. Or the opposite - being so fidgety or restless that you have been moving around a lot more than usual: 0 Thoughts that you would be better off dead, or of hurting yourself in some way: 0 PHQ-9 Total Score: 0 If you checked off any problems, how difficult have these problems made it for you to do your work, take care of things at home, or get along with other people?: Not difficult at all     Goals Addressed   None          Objective:    There were no vitals filed for this visit. There is no height or weight on file to calculate BMI.  Hearing/Vision screen Vision Screening - Comments:: Patient states she is up to date with her yearly eye exam at [facility].  Immunizations and Health Maintenance Health Maintenance  Topic Date Due   OPHTHALMOLOGY EXAM  02/05/2024   COVID-19 Vaccine (12 - 2025-26 season) 04/21/2024   HEMOGLOBIN A1C  06/21/2024   Mammogram  11/12/2024   Diabetic kidney evaluation - eGFR measurement  12/21/2024   Diabetic kidney evaluation - Urine ACR  12/21/2024   FOOT EXAM  12/24/2024   Medicare Annual Wellness (AWV)  12/24/2024   DTaP/Tdap/Td (3 - Td or Tdap) 08/27/2027   Pneumococcal Vaccine: 50+ Years  Completed   Influenza Vaccine  Completed   Bone Density Scan  Completed   Zoster Vaccines- Shingrix  Completed   Meningococcal B Vaccine  Aged Out   Colonoscopy  Discontinued   Hepatitis C Screening  Discontinued        Assessment/Plan:  This is a routine wellness examination for [name].  Patient Care Team: Perri Ronal PARAS, MD as PCP - General (Internal Medicine) Duwayne Purchase, MD as Consulting Physician (Orthopedic Surgery)  I have personally reviewed and noted the following in the patients chart:   Medical and social  history Use of alcohol, tobacco or illicit drugs  Current medications and supplements including opioid prescriptions. Functional ability and status Nutritional status Physical activity Advanced directives List of other physicians Hospitalizations, surgeries, and ER visits in previous 12 months Vitals Screenings to include cognitive, depression, and falls Referrals and appointments  No orders of the defined types were placed in this encounter.  In addition, I have reviewed and discussed with patient certain  preventive protocols, quality metrics, and best practice recommendations. A written personalized care plan for preventive services as well as general preventive health recommendations were provided to patient.   [name], CMA   01/05/2024   No follow-ups on file.  After Visit Summary: (In Person-Printed) AVS printed and given to the patient  Nurse Notes: none "

## 2024-01-05 NOTE — Patient Instructions (Signed)
 [name],  Thank you for taking the time for your Medicare Wellness Visit. I appreciate your continued commitment to your health goals. Please review the care plan we discussed, and feel free to reach out if I can assist you further.  Please note that Annual Wellness Visits do not include a physical exam. Some assessments may be limited, especially if the visit was conducted virtually. If needed, we may recommend an in-person follow-up with your provider.  Ongoing Care Seeing your primary care provider every 3 to 6 months helps us  monitor your health and provide consistent, personalized care.   Referrals If a referral was made during today's visit and you haven't received any updates within two weeks, please contact the referred provider directly to check on the status.  Recommended Screenings:  Health Maintenance  Topic Date Due   Eye exam for diabetics  02/05/2024   COVID-19 Vaccine (12 - 2025-26 season) 04/21/2024   Hemoglobin A1C  06/21/2024   Breast Cancer Screening  11/12/2024   Yearly kidney function blood test for diabetes  12/21/2024   Yearly kidney health urinalysis for diabetes  12/21/2024   Complete foot exam   12/24/2024   Medicare Annual Wellness Visit  12/24/2024   DTaP/Tdap/Td vaccine (3 - Td or Tdap) 08/27/2027   Pneumococcal Vaccine for age over 44  Completed   Flu Shot  Completed   Osteoporosis screening with Bone Density Scan  Completed   Zoster (Shingles) Vaccine  Completed   Meningitis B Vaccine  Aged Out   Colon Cancer Screening  Discontinued   Hepatitis C Screening  Discontinued       01/05/2024   10:27 AM  Advanced Directives  Does Patient Have a Medical Advance Directive? Yes  Type of Advance Directive Living will;Healthcare Power of Attorney  Does patient want to make changes to medical advance directive? No - Patient declined  Copy of Healthcare Power of Attorney in Chart? No - copy requested    Vision: Annual vision screenings are  recommended for early detection of glaucoma, cataracts, and diabetic retinopathy. These exams can also reveal signs of chronic conditions such as diabetes and high blood pressure.  Dental: Annual dental screenings help detect early signs of oral cancer, gum disease, and other conditions linked to overall health, including heart disease and diabetes.  Please see the attached documents for additional preventive care recommendations.

## 2024-01-11 ENCOUNTER — Encounter: Payer: Self-pay | Admitting: Internal Medicine

## 2024-01-11 NOTE — Patient Instructions (Addendum)
 It was a pleasure to see you today. Hgb AIC is normal I.e. no glucose intolerance found. Continue diet and exercise regimen. Continue losartan  and simvastatin . Please consider Pneumococcal vaccine. This can be obtained at pharmacy. Return in one year or as needed.

## 2024-01-14 ENCOUNTER — Encounter (HOSPITAL_BASED_OUTPATIENT_CLINIC_OR_DEPARTMENT_OTHER): Payer: Self-pay

## 2024-01-14 ENCOUNTER — Emergency Department (HOSPITAL_BASED_OUTPATIENT_CLINIC_OR_DEPARTMENT_OTHER): Admitting: Radiology

## 2024-01-14 ENCOUNTER — Telehealth: Payer: Self-pay | Admitting: Internal Medicine

## 2024-01-14 ENCOUNTER — Other Ambulatory Visit: Payer: Self-pay

## 2024-01-14 ENCOUNTER — Emergency Department (HOSPITAL_BASED_OUTPATIENT_CLINIC_OR_DEPARTMENT_OTHER)
Admission: EM | Admit: 2024-01-14 | Discharge: 2024-01-14 | Disposition: A | Discharge status: Home / Self Care | Attending: Emergency Medicine | Admitting: Emergency Medicine

## 2024-01-14 DIAGNOSIS — I10 Essential (primary) hypertension: Secondary | ICD-10-CM | POA: Insufficient documentation

## 2024-01-14 DIAGNOSIS — U071 COVID-19: Secondary | ICD-10-CM | POA: Insufficient documentation

## 2024-01-14 DIAGNOSIS — Z79899 Other long term (current) drug therapy: Secondary | ICD-10-CM | POA: Insufficient documentation

## 2024-01-14 DIAGNOSIS — R059 Cough, unspecified: Secondary | ICD-10-CM | POA: Diagnosis present

## 2024-01-14 LAB — RESP PANEL BY RT-PCR (RSV, FLU A&B, COVID)  RVPGX2
Influenza A by PCR: NEGATIVE
Influenza B by PCR: NEGATIVE
Resp Syncytial Virus by PCR: NEGATIVE
SARS Coronavirus 2 by RT PCR: POSITIVE — AB

## 2024-01-14 MED ORDER — FLUTICASONE PROPIONATE 50 MCG/ACT NA SUSP: 1.0000 | Freq: Every day | NASAL | 2 refills | Status: AC

## 2024-01-14 MED ORDER — AEROCHAMBER PLUS FLO-VU MEDIUM MISC
1.0000 | Freq: Once | Status: AC
  Administered 2024-01-14: 1

## 2024-01-14 MED ORDER — ALBUTEROL SULFATE HFA 108 (90 BASE) MCG/ACT IN AERS
1.0000 | INHALATION_SPRAY | Freq: Once | RESPIRATORY_TRACT | Status: AC
  Administered 2024-01-14: 1 via RESPIRATORY_TRACT
  Filled 2024-01-14: qty 6.7

## 2024-01-14 NOTE — Discharge Instructions (Signed)
 You were seen in the ER today for evaluation of your symptoms. You tested positive for the COVID.  Please continue your at home care as you are doing.  Sure that you are resting and allowing her body to heal.  I am sending you home with 2 medications.  One is a medication called Flonase  which is a nasal spray that will help with your congestion and sinus pressure.  The other you were given in the ER which is called albuterol .  This can help with your coughing.  I still recommend again continuing with your over-the-counter medication including the allergy medication.  You can also try warm showers to help with your congestion.  I have included additional information into your discharge report for you to review.  Please follow-up with your PCP.  If you start to develop any chest pain, shortness of breath, or any new or worsening symptoms/concern, please return to your nearest emergency department for reevaluation.  Get help right away if: You have trouble breathing or get short of breath. You have pain or pressure in your chest. You're feeling confused. These symptoms may be an emergency. Get help right away. Call 911. Do not wait to see if the symptoms will go away. Do not drive yourself to the hospital.

## 2024-01-14 NOTE — Telephone Encounter (Unsigned)
 Copied from CRM #[id number]. Topic: Clinical - Refused Triage >> Jan 14, 2024  8:28 AM Mia F wrote: Patient/caller voiced complaints of Pt says that she has a sinus infection. She says she has a cough with mucous, sinus headaches. She says she has congestion in her chest. Due to the office bing closed today pt declined NT and says she will just go to urgent care  Declined transfer to triage.

## 2024-01-14 NOTE — ED Provider Notes (Signed)
 " [place] EMERGENCY DEPARTMENT AT [facility] Provider Note   CSN: [id number] Arrival date & time: 01/14/24  1049     Patient presents with: Cough   [name] is a 78 y.o. female with history of hypertension and anemia presents to the emergency department today for evaluation of cough.  On Saturday, patient reports that all started with a dry cough.  She then progressively throughout the past 2 days start developing some sneezing with rhinorrhea and nasal congestion.  She reports that has been taking allergy medication and Mucinex but now has had some sinus pressure as well.  Denies any recorded fevers but reports she has felt some chills and some minor bodyaches.  She reports that today the cough became productive.  She has had bronchitis before and was concerned that she may be developing this.  She denies any chest pain or shortness of breath.  She reports that her doctor's office was closed which is why she came into the Emergency Department today.  She denies any smoking.  Allergic to sulfa and morphine.  She denies any sick contacts however she did spend an extended amount of [place] at the airport.   Cough Associated symptoms: chills and rhinorrhea   Associated symptoms: no chest pain, no fever, no shortness of breath and no sore throat        Prior to Admission medications  Medication Sig Start Date End Date Taking? Authorizing Provider  fluticasone  (FLONASE ) 50 MCG/ACT nasal spray Place 1 spray into both nostrils daily. 01/14/24  Yes Bernis Ernst, PA-C  acetaminophen  (TYLENOL ) 500 MG tablet Take 1,000 mg by mouth every 6 (six) hours as needed (for pain).    [provider]  ALPHA-LIPOIC ACID PO Take 1 tablet by mouth daily.    [provider]  cholecalciferol  (VITAMIN D ) 1000 units tablet Take 2,000 Units by mouth at bedtime.     [provider]  Coenzyme Q10 (CO Q 10 PO) Take 1 tablet by mouth daily.    [provider]   docusate sodium  (COLACE) 250 MG capsule Take 250 mg by mouth at bedtime.    [provider]  DULoxetine  (CYMBALTA ) 30 MG capsule TAKE 3 CAPSULES(90 MG) BY MOUTH DAILY 12/08/23   Perri Ronal PARAS, MD  losartan  (COZAAR ) 50 MG tablet TAKE 1 TABLET(50 MG) BY MOUTH DAILY 11/23/23   Perri Ronal PARAS, MD  nystatin -triamcinolone  (MYCOLOG II) cream APPLY TOPICALLY TO THE AFFECTED AREA TWICE DAILY 08/29/23   Perri Ronal PARAS, MD  Omega-3 Fatty Acids (FISH OIL PO) Take 2 capsules by mouth 2 (two) times daily.     [provider]  omeprazole (PRILOSEC) 20 MG capsule Take 20 mg by mouth daily.    [provider]  simvastatin  (ZOCOR ) 40 MG tablet TAKE 1 TABLET(40 MG) BY MOUTH AT BEDTIME 12/29/23   Perri Ronal PARAS, MD  TURMERIC PO Take 1 tablet by mouth at bedtime.     [provider]  VOLTAREN 1 % GEL Apply 2 g topically 2 (two) times daily.  11/05/12   [provider]    Allergies: Sulfa antibiotics, Atenolol , Codeine, and Morphine    Review of Systems  Constitutional:  Positive for chills. Negative for fever.  HENT:  Positive for congestion, rhinorrhea and sinus pressure. Negative for sore throat.   Respiratory:  Positive for cough. Negative for shortness of breath.   Cardiovascular:  Negative for chest pain.    Updated Vital Signs BP (!) 143/78 (BP Location:  Right Arm)   Pulse 70   Temp 98.8 F (37.1 C) (Oral)   Resp 20   Ht 5' 3 (1.6 m)   Wt 90.7 kg   SpO2 98%   BMI 35.43 kg/m   Physical Exam Vitals and nursing note reviewed.  Constitutional:      General: She is not in acute distress.    Appearance: She is not ill-appearing or toxic-appearing.  HENT:     Head: Normocephalic and atraumatic.     Right Ear: Tympanic membrane, ear canal and external ear normal.     Left Ear: Tympanic membrane, ear canal and external ear normal.     Nose:     Comments: Bilateral nasal turbinate edema and erythema with scant clear nasal discharge.    Mouth/Throat:      Mouth: Mucous membranes are moist.     Comments: No pharyngeal erythema, exudate, or edema noted.  Uvula midline.  Airway patent.  Moist mucous membranes. Eyes:     General: No scleral icterus.    Conjunctiva/sclera: Conjunctivae normal.  Cardiovascular:     Rate and Rhythm: Normal rate.  Pulmonary:     Effort: Pulmonary effort is normal. No respiratory distress.     Breath sounds: Normal breath sounds. No wheezing or rhonchi.  Skin:    General: Skin is warm and dry.  Neurological:     General: No focal deficit present.     Mental Status: She is alert.     (all labs ordered are listed, but only abnormal results are displayed) Labs Reviewed  RESP PANEL BY RT-PCR (RSV, FLU A&B, COVID)  [id number] - Abnormal; Notable for the following components:      Result Value   SARS Coronavirus 2 by RT PCR POSITIVE (*)    All other components within normal limits    EKG: None  Radiology: DG Chest 2 View Result Date: 01/14/2024 CLINICAL DATA:  Cough and upper respiratory infection. EXAM: CHEST - 2 VIEW COMPARISON:  07/12/2021 FINDINGS: The cardiomediastinal contours are normal. Aortic atherosclerosis. Pulmonary vasculature is normal. No consolidation, pleural effusion, or pneumothorax. Broad-based scoliotic curvature of the thoracic spine. No acute osseous abnormalities are seen. IMPRESSION: No active cardiopulmonary disease. Electronically Signed   By: Andrea Gasman M.D.   On: 01/14/2024 12:22    Procedures   Medications Ordered in the ED  albuterol  (VENTOLIN  HFA) 108 (90 Base) MCG/ACT inhaler 1 puff (1 puff Inhalation Given 01/14/24 1425)  AeroChamber Plus Flo-Vu Medium MISC 1 each (1 each Other Given 01/14/24 1428)      Medical Decision Making Amount and/or Complexity of Data Reviewed Radiology: ordered.  Risk Prescription drug management.   78 y.o. female presents to the ER for evaluation of cough and cold symptoms. Differential diagnosis includes but is not limited to viral  illness, COVID, flu, RSV, bronchitis, PNA. Vital signs mildly elevated blood pressure otherwise unremarkable. Physical exam as noted above.   Workup initiated in triage.  I independently reviewed and interpreted the patient's labs.  COVID-positive.  Chest x-ray shows No active cardiopulmonary disease. Per radiologist's interpretation.    Patient has some very minimally elevated blood pressure wise vital signs are stable.  She does not appear in any acute distress.  Lung sounds are clear to auscultation.  Speaking in full sentences with ease.  Chest x-ray negative.  Does have some nasal congestion with rhinorrhea.  Moist he has membranes.  No pharyngeal erythema, edema, or exudate noted.  She is satting well on room air  without increased work of breathing.  Her symptoms been going on for 5 days.  Do not feel that she is a candidate for Paxlovid .  Will send her home with albuterol  inhaler to help with the frequent coughing, again her lungs do sound clear.  Will also prescribe her some Flonase  to help with her nasal congestion and ear pressure.  Ears are clear TMs clear and intact, no otitis media or externa.  I given her strict return precautions recommended to follow with her PCP within the next week.  Recommended she continue the medication regimen she is been taking, however recommended she discontinue her Afrin.  She stable for discharge home with strict return precaution ciprofloxacin  times.  We discussed the results of the labs/imaging. The plan is supportive care, follow-up with PCP. We discussed strict return precautions and red flag symptoms. The patient verbalized their understanding and agrees to the plan. The patient is stable and being discharged home in good condition.  Portions of this report may have been transcribed using voice recognition software. Every effort was made to ensure accuracy; however, inadvertent computerized transcription errors may be present.    Final diagnoses:  COVID     ED Discharge Orders          Ordered    fluticasone  (FLONASE ) 50 MCG/ACT nasal spray  Daily        01/14/24 1411               Bernis Ernst, PA-C 01/14/24 1442  "

## 2024-01-14 NOTE — ED Triage Notes (Signed)
 Pt reports flu like S/S for several days.

## 2024-02-08 ENCOUNTER — Other Ambulatory Visit: Payer: Self-pay | Admitting: Internal Medicine
